# Patient Record
Sex: Female | Born: 1970 | Hispanic: Yes | Marital: Married | Smoking: Former smoker
Health system: Southern US, Community
[De-identification: ages and names within clinical notes are randomized; demographics above are authoritative.]

## PROBLEM LIST (undated history)

## (undated) DIAGNOSIS — I10 Essential (primary) hypertension: Secondary | ICD-10-CM

## (undated) DIAGNOSIS — E28319 Asymptomatic premature menopause: Secondary | ICD-10-CM

## (undated) DIAGNOSIS — N200 Calculus of kidney: Secondary | ICD-10-CM

## (undated) DIAGNOSIS — M359 Systemic involvement of connective tissue, unspecified: Secondary | ICD-10-CM

## (undated) DIAGNOSIS — K219 Gastro-esophageal reflux disease without esophagitis: Secondary | ICD-10-CM

## (undated) DIAGNOSIS — G43909 Migraine, unspecified, not intractable, without status migrainosus: Secondary | ICD-10-CM

## (undated) DIAGNOSIS — F419 Anxiety disorder, unspecified: Secondary | ICD-10-CM

## (undated) DIAGNOSIS — M1990 Unspecified osteoarthritis, unspecified site: Secondary | ICD-10-CM

## (undated) DIAGNOSIS — Z87442 Personal history of urinary calculi: Secondary | ICD-10-CM

## (undated) DIAGNOSIS — J189 Pneumonia, unspecified organism: Secondary | ICD-10-CM

## (undated) DIAGNOSIS — T7840XA Allergy, unspecified, initial encounter: Secondary | ICD-10-CM

## (undated) HISTORY — PX: AUGMENTATION MAMMAPLASTY: SUR837

## (undated) HISTORY — PX: HERNIA REPAIR: SHX51

## (undated) HISTORY — PX: TUBAL LIGATION: SHX77

## (undated) HISTORY — PX: COSMETIC SURGERY: SHX468

## (undated) HISTORY — DX: Migraine, unspecified, not intractable, without status migrainosus: G43.909

## (undated) HISTORY — DX: Asymptomatic premature menopause: E28.319

## (undated) HISTORY — DX: Essential (primary) hypertension: I10

## (undated) HISTORY — DX: Calculus of kidney: N20.0

## (undated) HISTORY — DX: Unspecified osteoarthritis, unspecified site: M19.90

## (undated) HISTORY — DX: Allergy, unspecified, initial encounter: T78.40XA

## (undated) HISTORY — PX: OTHER SURGICAL HISTORY: SHX169

## (undated) HISTORY — PX: ANKLE SURGERY: SHX546

---

## 2017-09-19 DIAGNOSIS — Z6836 Body mass index (BMI) 36.0-36.9, adult: Secondary | ICD-10-CM | POA: Insufficient documentation

## 2017-09-19 DIAGNOSIS — Z6834 Body mass index (BMI) 34.0-34.9, adult: Secondary | ICD-10-CM | POA: Insufficient documentation

## 2017-09-19 DIAGNOSIS — E669 Obesity, unspecified: Secondary | ICD-10-CM | POA: Insufficient documentation

## 2017-10-17 DIAGNOSIS — M059 Rheumatoid arthritis with rheumatoid factor, unspecified: Secondary | ICD-10-CM | POA: Insufficient documentation

## 2020-05-19 NOTE — Progress Notes (Signed)
Patient, No Pcp Per   Chief Complaint  Patient presents with  . Vaginal Itching    irritation, no discharge/odor x 1 week  . Urinary Tract Infection    frequency urinating, no burning x 1 week    HPI:      [name] is a 49 y.o. No obstetric history on file. whose LMP was No LMP recorded. Patient is postmenopausal., presents today for NP eval of vaginal itching without increased d/c or odor for the past wk. Treated with monistat-3 with some sx improvement. Also did home remedies with tea tree oil. Recently moved here and changed detergents, also using dryer sheets, uses regular soap but recently changed to vagisil after sx started.  No recent abx use.   Also with urinary frequency/urgency with pelvic pressure for the past wk. No LBP, fevers, hematuria, dysuria. Hx of UTIs in distant past. Drinks minimal caffeine.  Last pap about 2 yrs ago with home GYN. No hx of abn paps.  Last mammo WNL 9/21.    History reviewed. No pertinent past medical history.  Past Surgical History:  Procedure Laterality Date  . ANKLE SURGERY    . AUGMENTATION MAMMAPLASTY     Breast Implant  . [place]  . OTHER SURGICAL HISTORY     tummy tuck  . OTHER SURGICAL HISTORY     carpal tunnel    History reviewed. No pertinent family history.  Social History   Socioeconomic History  . Marital status: Married    Spouse name: Not on file  . Number of children: Not on file  . Years of education: Not on file  . Highest education level: Not on file  Occupational History  . Not on file  Tobacco Use  . Smoking status: Former Research scientist (life sciences)  . Smokeless tobacco: Never Used  Vaping Use  . Vaping Use: Never used  Substance and Sexual Activity  . Alcohol use: Yes  . Drug use: Never  . Sexual activity: Not Currently    Birth control/protection: Post-menopausal  Other Topics Concern  . Not on file  Social History Narrative  . Not on file   Social Determinants of  Health   Financial Resource Strain:   . Difficulty of Paying Living Expenses: Not on file  Food Insecurity:   . Worried About Charity fundraiser in the Last Year: Not on file  . Ran Out of Food in the Last Year: Not on file  Transportation Needs:   . Lack of Transportation (Medical): Not on file  . Lack of Transportation (Non-Medical): Not on file  Physical Activity:   . Days of Exercise per Week: Not on file  . Minutes of Exercise per Session: Not on file  Stress:   . Feeling of Stress : Not on file  Social Connections:   . Frequency of Communication with Friends and Family: Not on file  . Frequency of Social Gatherings with Friends and Family: Not on file  . Attends Religious Services: Not on file  . Active Member of Clubs or Organizations: Not on file  . Attends Archivist Meetings: Not on file  . Marital Status: Not on file  Intimate Partner Violence:   . Fear of Current or Ex-Partner: Not on file  . Emotionally Abused: Not on file  . Physically Abused: Not on file  . Sexually Abused: Not on file    No outpatient medications prior to visit.   No  facility-administered medications prior to visit.      ROS:  Review of Systems  Constitutional: Negative for fever.  Gastrointestinal: Negative for blood in stool, constipation, diarrhea, nausea and vomiting.  Genitourinary: Positive for dyspareunia, frequency and urgency. Negative for dysuria, flank pain, hematuria, vaginal bleeding, vaginal discharge and vaginal pain.  Musculoskeletal: Negative for back pain.  Skin: Negative for rash.    OBJECTIVE:   Vitals:  BP 120/80   Ht 5\' 3"  (1.6 m)   Wt 206 lb (93.4 kg)   BMI 36.49 kg/m   Physical Exam Vitals reviewed.  Constitutional:      Appearance: She is well-developed.  Pulmonary:     Effort: Pulmonary effort is normal.  Genitourinary:    General: Normal vulva.     Pubic Area: No rash.      Labia:        Right: No rash, tenderness or lesion.         Left: No rash, tenderness or lesion.      Vagina: Vaginal discharge present. No erythema or tenderness.     Cervix: Normal.     Uterus: Normal. Not enlarged and not tender.      Adnexa: Right adnexa normal and left adnexa normal.       Right: No mass or tenderness.         Left: No mass or tenderness.    Musculoskeletal:        General: Normal range of motion.     Cervical back: Normal range of motion.  Skin:    General: Skin is warm and dry.  Neurological:     General: No focal deficit present.     Mental Status: She is alert and oriented to person, place, and time.  Psychiatric:        Mood and Affect: Mood normal.        Behavior: Behavior normal.        Thought Content: Thought content normal.        Judgment: Judgment normal.     Results: Results for orders placed or performed in visit on 05/20/20 (from the past 24 hour(s))  POCT Wet Prep with KOH     Status: Normal   Collection Time: 05/20/20 10:02 AM  Result Value Ref Range   Trichomonas, UA Negative    Clue Cells Wet Prep HPF POC neg    Epithelial Wet Prep HPF POC     Yeast Wet Prep HPF POC neg    Bacteria Wet Prep HPF POC     RBC Wet Prep HPF POC     WBC Wet Prep HPF POC     KOH Prep POC Negative Negative  POCT Urinalysis Dipstick     Status: Normal   Collection Time: 05/20/20 10:03 AM  Result Value Ref Range   Color, UA yellow    Clarity, UA clear    Glucose, UA Negative Negative   Bilirubin, UA neg    Ketones, UA neg    Spec Grav, UA 1.020 1.010 - 1.025   Blood, UA neg    pH, UA 5.0 5.0 - 8.0   Protein, UA Negative Negative   Urobilinogen, UA     Nitrite, UA neg    Leukocytes, UA Negative Negative   Appearance     Odor       Assessment/Plan: Vaginal itching - Plan: fluconazole (DIFLUCAN) 150 MG tablet, POCT Wet Prep with KOH; neg exam, neg wet prep. Sx are more labia majora/ext and not internal.  Treat empirically with diflucan, but sx may be more chem derm. Dove sens skin soap, line dry underwear.  F/u prn.   UTI symptoms - Plan: POCT Urinalysis Dipstick, Urine Culture; neg UA, pos sx. Check C&S. Will f/u if pos.    Meds ordered this encounter  Medications  . fluconazole (DIFLUCAN) 150 MG tablet    Sig: Take 1 tablet (150 mg total) by mouth once for 1 dose.    Dispense:  1 tablet    Refill:  0    Order Specific Question:   Supervising Provider    Answer:   Gae Dry [[id number]]      Return if symptoms worsen or fail to improve.  Janaki Exley B. Phylis Javed, PA-C 05/20/2020 10:05 AM

## 2020-05-19 NOTE — Patient Instructions (Signed)
I value your feedback and entrusting us with your care. If you get a [facility] patient survey, I would appreciate you taking the time to let us know about your experience today. Thank you!  As of July 11, 2019, your lab results will be released to your MyChart immediately, before I even have a chance to see them. Please give me time to review them and contact you if there are any abnormalities. Thank you for your patience.  

## 2020-05-20 ENCOUNTER — Encounter: Payer: Self-pay | Admitting: Obstetrics and Gynecology

## 2020-05-20 ENCOUNTER — Ambulatory Visit (INDEPENDENT_AMBULATORY_CARE_PROVIDER_SITE_OTHER): Payer: Commercial Managed Care - PPO | Admitting: Obstetrics and Gynecology

## 2020-05-20 ENCOUNTER — Other Ambulatory Visit: Payer: Self-pay

## 2020-05-20 VITALS — BP 120/80 | Ht 63.0 in | Wt 206.0 lb

## 2020-05-20 DIAGNOSIS — N898 Other specified noninflammatory disorders of vagina: Secondary | ICD-10-CM | POA: Diagnosis not present

## 2020-05-20 DIAGNOSIS — R399 Unspecified symptoms and signs involving the genitourinary system: Secondary | ICD-10-CM | POA: Diagnosis not present

## 2020-05-20 LAB — POCT URINALYSIS DIPSTICK
Bilirubin, UA: NEGATIVE
Blood, UA: NEGATIVE
Glucose, UA: NEGATIVE
Ketones, UA: NEGATIVE
Leukocytes, UA: NEGATIVE
Nitrite, UA: NEGATIVE
Protein, UA: NEGATIVE
Spec Grav, UA: 1.02 (ref 1.010–1.025)
pH, UA: 5 (ref 5.0–8.0)

## 2020-05-20 LAB — POCT WET PREP WITH KOH
Clue Cells Wet Prep HPF POC: NEGATIVE
KOH Prep POC: NEGATIVE
Trichomonas, UA: NEGATIVE
Yeast Wet Prep HPF POC: NEGATIVE

## 2020-05-20 MED ORDER — FLUCONAZOLE 150 MG PO TABS: 150.0000 mg | ORAL_TABLET | Freq: Once | ORAL | 0 refills | Status: AC

## 2020-05-22 LAB — URINE CULTURE

## 2020-07-29 ENCOUNTER — Other Ambulatory Visit: Payer: Self-pay

## 2020-07-29 ENCOUNTER — Ambulatory Visit: Payer: Commercial Managed Care - PPO | Admitting: Adult Health

## 2020-07-29 ENCOUNTER — Encounter: Payer: Self-pay | Admitting: Adult Health

## 2020-07-29 VITALS — BP 143/89 | HR 93 | Temp 98.0°F | Resp 16 | Ht 62.0 in | Wt 199.8 lb

## 2020-07-29 DIAGNOSIS — F419 Anxiety disorder, unspecified: Secondary | ICD-10-CM

## 2020-07-29 DIAGNOSIS — R1310 Dysphagia, unspecified: Secondary | ICD-10-CM | POA: Diagnosis not present

## 2020-07-29 DIAGNOSIS — H539 Unspecified visual disturbance: Secondary | ICD-10-CM

## 2020-07-29 DIAGNOSIS — K219 Gastro-esophageal reflux disease without esophagitis: Secondary | ICD-10-CM | POA: Diagnosis not present

## 2020-07-29 DIAGNOSIS — Z8669 Personal history of other diseases of the nervous system and sense organs: Secondary | ICD-10-CM

## 2020-07-29 DIAGNOSIS — E538 Deficiency of other specified B group vitamins: Secondary | ICD-10-CM

## 2020-07-29 DIAGNOSIS — R319 Hematuria, unspecified: Secondary | ICD-10-CM

## 2020-07-29 DIAGNOSIS — Z1152 Encounter for screening for COVID-19: Secondary | ICD-10-CM

## 2020-07-29 DIAGNOSIS — M62838 Other muscle spasm: Secondary | ICD-10-CM

## 2020-07-29 DIAGNOSIS — Z1389 Encounter for screening for other disorder: Secondary | ICD-10-CM

## 2020-07-29 DIAGNOSIS — E559 Vitamin D deficiency, unspecified: Secondary | ICD-10-CM

## 2020-07-29 DIAGNOSIS — R911 Solitary pulmonary nodule: Secondary | ICD-10-CM

## 2020-07-29 DIAGNOSIS — Z1211 Encounter for screening for malignant neoplasm of colon: Secondary | ICD-10-CM | POA: Insufficient documentation

## 2020-07-29 DIAGNOSIS — L659 Nonscarring hair loss, unspecified: Secondary | ICD-10-CM

## 2020-07-29 DIAGNOSIS — R5383 Other fatigue: Secondary | ICD-10-CM

## 2020-07-29 LAB — POCT URINALYSIS DIPSTICK
Bilirubin, UA: NEGATIVE
Glucose, UA: NEGATIVE
Ketones, UA: NEGATIVE
Leukocytes, UA: NEGATIVE
Nitrite, UA: NEGATIVE
Protein, UA: NEGATIVE
Spec Grav, UA: <=1.005 — AB (ref 1.010–1.025)
Urobilinogen, UA: 0.2 E.U./dL
pH, UA: 8.5 — AB (ref 5.0–8.0)

## 2020-07-29 MED ORDER — BACLOFEN 10 MG PO TABS: 10.0000 mg | ORAL_TABLET | Freq: Every evening | ORAL | 0 refills | Status: DC | PRN

## 2020-07-29 MED ORDER — HYDROCHLOROTHIAZIDE 25 MG PO TABS: 25.0000 mg | ORAL_TABLET | Freq: Every day | ORAL | 0 refills | Status: DC

## 2020-07-29 NOTE — Addendum Note (Signed)
Addended by: Fonda Kinder on: 07/29/2020 04:59 PM   Modules accepted: Orders

## 2020-07-29 NOTE — Patient Instructions (Addendum)
Hydrochlorothiazide, HCTZ Oral Capsules or Tablets What is this medicine? HYDROCHLOROTHIAZIDE (hye droe klor oh THYE a zide) is a diuretic. It helps you make more urine and to lose salt and excess water from your body. It treats swelling from heart, kidney, or liver disease. It also treats high blood pressure. This medicine may be used for other purposes; ask your health care provider or pharmacist if you have questions. COMMON BRAND NAME(S): Esidrix, Ezide, HydroDIURIL, Microzide, Oretic, Zide What should I tell my health care provider before I take this medicine? They need to know if you have any of these conditions:  diabetes  gout  immune system problems, like lupus  kidney disease or kidney stones  liver disease  pancreatitis  small amount of urine or difficulty passing urine  an unusual or allergic reaction to hydrochlorothiazide, sulfa drugs, other medicines, foods, dyes, or preservatives  pregnant or trying to get pregnant  breast-feeding How should I use this medicine? Take this drug by mouth. Take it as directed on the prescription label at the same time every day. You can take it with or without food. If it upsets your stomach, take it with food. Keep taking it unless your health care provider tells you to stop. Talk to your health care provider about the use of this drug in children. While it may be prescribed for children as young as newborns for selected conditions, precautions do apply. Overdosage: If you think you have taken too much of this medicine contact a poison control center or emergency room at once. NOTE: This medicine is only for you. Do not share this medicine with others. What if I miss a dose? If you miss a dose, take it as soon as you can. If it is almost time for your next dose, take only that dose. Do not take double or extra doses. What may interact with this  medicine?  cholestyramine  colestipol  digoxin  dofetilide  lithium  medicines for blood pressure  medicines for diabetes  medicines that relax muscles for surgery  other diuretics  steroid medicines like prednisone or cortisone This list may not describe all possible interactions. Give your health care provider a list of all the medicines, herbs, non-prescription drugs, or dietary supplements you use. Also tell them if you smoke, drink alcohol, or use illegal drugs. Some items may interact with your medicine. What should I watch for while using this medicine? Visit your doctor or health care professional for regular checks on your progress. Check your blood pressure as directed. Ask your doctor or health care professional what your blood pressure should be and when you should contact him or her. Talk to your health care professional about your risk of skin cancer. You may be more at risk for skin cancer if you take this medicine. This medicine can make you more sensitive to the sun. Keep out of the sun. If you cannot avoid being in the sun, wear protective clothing and use sunscreen. Do not use sun lamps or tanning beds/booths. You may need to be on a special diet while taking this medicine. Ask your doctor. Check with your doctor or health care professional if you get an attack of severe diarrhea, nausea and vomiting, or if you sweat a lot. The loss of too much body fluid can make it dangerous for you to take this medicine. You may get drowsy or dizzy. Do not drive, use machinery, or do anything that needs mental alertness until you know how  this medicine affects you. Do not stand or sit up quickly, especially if you are an older patient. This reduces the risk of dizzy or fainting spells. Alcohol may interfere with the effect of this medicine. Avoid alcoholic drinks. This medicine may increase blood sugar. Ask your healthcare provider if changes in diet or medicines are needed if you  have diabetes. What side effects may I notice from receiving this medicine? Side effects that you should report to your doctor or health care professional as soon as possible:  allergic reactions such as skin rash or itching, hives, swelling of the lips, mouth, tongue, or throat  changes in vision  chest pain  eye pain  fast or irregular heartbeat  feeling faint or lightheaded, falls  gout attack  muscle pain or cramps  pain or difficulty when passing urine  pain, tingling, numbness in the hands or feet  redness, blistering, peeling or loosening of the skin, including inside the mouth   signs and symptoms of high blood sugar such as being more thirsty or hungry or having to urinate more than normal. You may also feel very tired or have blurry vision.  unusually weak Side effects that usually do not require medical attention (report to your doctor or health care professional if they continue or are bothersome):  change in sex drive or performance  dry mouth  headache  stomach upset This list may not describe all possible side effects. Call your doctor for medical advice about side effects. You may report side effects to FDA at 1-800-FDA-1088. Where should I keep my medicine? Keep out of the reach of children and pets. Store at room temperature between 20 and 25 degrees C (68 and 77 degrees F). Protect from light and moisture. Keep the container tightly closed. Do not freeze. Throw away any unused drug after the expiration date. NOTE: This sheet is a summary. It may not cover all possible information. If you have questions about this medicine, talk to your doctor, pharmacist, or health care provider.  2020 Elsevier/Gold Standard (2019-03-21 16:52:59) Psyllium granules or powder for solution What is this medicine? PSYLLIUM (SIL i yum) is a bulk-forming fiber laxative. This medicine is used to treat constipation. Increasing fiber in the diet may also help lower cholesterol  and promote heart health for some people. This medicine may be used for other purposes; ask your health care provider or pharmacist if you have questions. COMMON BRAND NAME(S): Fiber Therapy, GenFiber, Geri-Mucil, Hydrocil, Konsyl, Metamucil, Metamucil MultiHealth, Mucilin, Natural Fiber Therapy, Reguloid What should I tell my health care provider before I take this medicine? They need to know if you have any of these conditions:  blockage in your bowel  difficulty swallowing  inflammatory bowel disease  phenylketonuria  stomach or intestine problems  sudden change in bowel habits lasting more than 2 weeks  an unusual or allergic reaction to psyllium, other medicines, dyes, or preservatives  pregnant or trying or get pregnant  breast-feeding How should I use this medicine? Mix this medicine into a full glass (240 mL) of water or other cool drink. Take this medicine by mouth. Follow the directions on the package labeling, or take as directed by your health care professional. Take your medicine at regular intervals. Do not take your medicine more often than directed. Talk to your pediatrician regarding the use of this medicine in children. While this drug may be prescribed for children as young as 62 years old for selected conditions, precautions do apply. Overdosage: If  you think you have taken too much of this medicine contact a poison control center or emergency room at once. NOTE: This medicine is only for you. Do not share this medicine with others. What if I miss a dose? If you miss a dose, take it as soon as you can. If it is almost time for your next dose, take only that dose. Do not take double or extra doses. What may interact with this medicine? Interactions are not expected. Take this product at least 2 hours before or after other medicines. This list may not describe all possible interactions. Give your health care provider a list of all the medicines, herbs,  non-prescription drugs, or dietary supplements you use. Also tell them if you smoke, drink alcohol, or use illegal drugs. Some items may interact with your medicine. What should I watch for while using this medicine? Check with your doctor or health care professional if your symptoms do not start to get better or if they get worse. Stop using this medicine and contact your doctor or health care professional if you have rectal bleeding or if you have to treat your constipation for more than 1 week. These could be signs of a more serious condition. Drink several glasses of water a day while you are taking this medicine. This will help to relieve constipation and prevent dehydration. What side effects may I notice from receiving this medicine? Side effects that you should report to your doctor or health care professional as soon as possible:  allergic reactions like skin rash, itching or hives, swelling of the face, lips, or tongue  breathing problems  chest pain  nausea, vomiting  rectal bleeding  trouble swallowing Side effects that usually do not require medical attention (report to your doctor or health care professional if they continue or are bothersome):  bloating  gas  stomach cramps This list may not describe all possible side effects. Call your doctor for medical advice about side effects. You may report side effects to FDA at 1-800-FDA-1088. Where should I keep my medicine? Keep out of the reach of children. Store at room temperature between 15 and 30 degrees C (59 and 86 degrees F). Protect from moisture. Throw away any unused medicine after the expiration date. NOTE: This sheet is a summary. It may not cover all possible information. If you have questions about this medicine, talk to your doctor, pharmacist, or health care provider.  2020 Elsevier/Gold Standard (2017-12-12 15:41:08) Muscle Cramps and Spasms Muscle cramps and spasms are when muscles tighten by themselves.  They usually get better within minutes. Muscle cramps are painful. They are usually stronger and last longer than muscle spasms. Muscle spasms may or may not be painful. They can last a few seconds or much longer. Cramps and spasms can affect any muscle, but they occur most often in the calf muscles of the leg. They are usually not caused by a serious problem. In many cases, the cause is not known. Some common causes include:  Doing more physical work or exercise than your body is ready for.  Using the muscles too much (overuse) by repeating certain movements too many times.  Staying in a certain position for a long time.  Playing a sport or doing an activity without preparing properly.  Using bad form or technique while playing a sport or doing an activity.  Not having enough water in your body (dehydration).  Injury.  Side effects of some medicines.  Low levels of the salts  and minerals in your blood (electrolytes), such as low potassium or calcium. Follow these instructions at home: Managing pain and stiffness      Massage, stretch, and relax the muscle. Do this for many minutes at a time.  If told, put heat on tight or tense muscles as often as told by your doctor. Use the heat source that your doctor recommends, such as a moist heat pack or a heating pad. ? Place a towel between your skin and the heat source. ? Leave the heat on for 20-30 minutes. ? Remove the heat if your skin turns bright red. This is very important if you are not able to feel pain, heat, or cold. You may have a greater risk of getting burned.  If told, put ice on the affected area. This may help if you are sore or have pain after a cramp or spasm. ? Put ice in a plastic bag. ? Place a towel between your skin and the bag. ? Leave the ice on for 20 minutes, 2-3 times a day.  Try taking hot showers or baths to help relax tight muscles. Eating and drinking  Drink enough fluid to keep your pee (urine) pale  yellow.  Eat a healthy diet to help ensure that your muscles work well. This should include: ? Fruits and vegetables. ? Lean protein. ? Whole grains. ? Low-fat or nonfat dairy products. General instructions  If you are having cramps often, avoid intense exercise for several days.  Take over-the-counter and prescription medicines only as told by your doctor.  Watch for any changes in your symptoms.  Keep all follow-up visits as told by your doctor. This is important. Contact a doctor if:  Your cramps or spasms get worse or happen more often.  Your cramps or spasms do not get better with time. Summary  Muscle cramps and spasms are when muscles tighten by themselves. They usually get better within minutes.  Cramps and spasms occur most often in the calf muscles of the leg.  Massage, stretch, and relax the muscle. This may help the cramp or spasm go away.  Drink enough fluid to keep your pee (urine) pale yellow. This information is not intended to replace advice given to you by your health care provider. Make sure you discuss any questions you have with your health care provider. Document Revised: 12/11/2017 Document Reviewed: 12/11/2017 Elsevier Patient Education  2020 [employer] Baclofen tablets What is this medicine? BACLOFEN (BAK loe fen) helps relieve spasms and cramping of muscles. It may be used to treat symptoms of multiple sclerosis or spinal cord injury. This medicine may be used for other purposes; ask your health care provider or pharmacist if you have questions. COMMON BRAND NAME(S): ED Baclofen, Lioresal What should I tell my health care provider before I take this medicine? They need to know if you have any of these conditions:  kidney disease  seizures  stroke  an unusual or allergic reaction to baclofen, other medicines, foods, dyes, or preservatives  pregnant or trying to get pregnant  breast-feeding How should I use this medicine? Take this  medicine by mouth. Swallow it with a drink of water. Follow the directions on the prescription label. Do not take more medicine than you are told to take. Talk to your pediatrician regarding the use of this medicine in children. Special care may be needed. Overdosage: If you think you have taken too much of this medicine contact a poison control center or emergency room at  once. NOTE: This medicine is only for you. Do not share this medicine with others. What if I miss a dose? If you miss a dose, take it as soon as you can. If it is almost time for your next dose, take only that dose. Do not take double or extra doses. What may interact with this medicine? Do not take this medication with any of the following medicines:  narcotic medicines for cough This medicine may also interact with the following medications:  alcohol  antihistamines for allergy, cough and cold  certain medicines for anxiety or sleep  certain medicines for depression like amitriptyline, fluoxetine, sertraline  certain medicines for seizures like phenobarbital, primidone  general anesthetics like halothane, isoflurane, methoxyflurane, propofol  local anesthetics like lidocaine, pramoxine, tetracaine  medicines that relax muscles for surgery  narcotic medicines for pain  phenothiazines like chlorpromazine, mesoridazine, prochlorperazine, thioridazine This list may not describe all possible interactions. Give your health care provider a list of all the medicines, herbs, non-prescription drugs, or dietary supplements you use. Also tell them if you smoke, drink alcohol, or use illegal drugs. Some items may interact with your medicine. What should I watch for while using this medicine? Tell your doctor or health care professional if your symptoms do not start to get better or if they get worse. Do not suddenly stop taking your medicine. If you do, you may develop a severe reaction. If your doctor wants you to stop the  medicine, the dose will be slowly lowered over time to avoid any side effects. Follow the advice of your doctor. You may get drowsy or dizzy. Do not drive, use machinery, or do anything that needs mental alertness until you know how this medicine affects you. Do not stand or sit up quickly, especially if you are an older patient. This reduces the risk of dizzy or fainting spells. Alcohol may interfere with the effect of this medicine. Avoid alcoholic drinks. If you are taking another medicine that also causes drowsiness, you may have more side effects. Give your health care provider a list of all medicines you use. Your doctor will tell you how much medicine to take. Do not take more medicine than directed. Call emergency for help if you have problems breathing or unusual sleepiness. What side effects may I notice from receiving this medicine? Side effects that you should report to your doctor or health care professional as soon as possible:  allergic reactions like skin rash, itching or hives, swelling of the face, lips, or tongue  breathing problems  changes in emotions or moods  changes in vision  chest pain  fast, irregular heartbeat  feeling faint or lightheaded, falls  hallucinations  loss of balance or coordination  ringing of the ears  seizures  trouble passing urine or change in the amount of urine  trouble walking  unusually weak or tired Side effects that usually do not require medical attention (report to your doctor or health care professional if they continue or are bothersome):  changes in taste  confusion  constipation  diarrhea  dry mouth  headache  muscle weakness  nausea, vomiting  trouble sleeping This list may not describe all possible side effects. Call your doctor for medical advice about side effects. You may report side effects to FDA at 1-800-FDA-1088. Where should I keep my medicine? Keep out of the reach of children. Store at room  temperature between 15 and 30 degrees C (59 and 86 degrees F). Keep container tightly closed.  Throw away any unused medicine after the expiration date. NOTE: This sheet is a summary. It may not cover all possible information. If you have questions about this medicine, talk to your doctor, pharmacist, or health care provider.  2020 Elsevier/Gold Standard (2017-04-29 09:56:42) Health Maintenance, Female Adopting a healthy lifestyle and getting preventive care are important in promoting health and wellness. Ask your health care provider about:  The right schedule for you to have regular tests and exams.  Things you can do on your own to prevent diseases and keep yourself healthy. What should I know about diet, weight, and exercise? Eat a healthy diet   Eat a diet that includes plenty of vegetables, fruits, low-fat dairy products, and lean protein.  Do not eat a lot of foods that are high in solid fats, added sugars, or sodium. Maintain a healthy weight Body mass index (BMI) is used to identify weight problems. It estimates body fat based on height and weight. Your health care provider can help determine your BMI and help you achieve or maintain a healthy weight. Get regular exercise Get regular exercise. This is one of the most important things you can do for your health. Most adults should:  Exercise for at least 150 minutes each week. The exercise should increase your heart rate and make you sweat (moderate-intensity exercise).  Do strengthening exercises at least twice a week. This is in addition to the moderate-intensity exercise.  Spend less time sitting. Even light physical activity can be beneficial. Watch cholesterol and blood lipids Have your blood tested for lipids and cholesterol at 50 years of age, then have this test every 5 years. Have your cholesterol levels checked more often if:  Your lipid or cholesterol levels are high.  You are older than 50 years of age.  You are  at high risk for heart disease. What should I know about cancer screening? Depending on your health history and family history, you may need to have cancer screening at various ages. This may include screening for:  Breast cancer.  Cervical cancer.  Colorectal cancer.  Skin cancer.  Lung cancer. What should I know about heart disease, diabetes, and high blood pressure? Blood pressure and heart disease  High blood pressure causes heart disease and increases the risk of stroke. This is more likely to develop in people who have high blood pressure readings, are of African descent, or are overweight.  Have your blood pressure checked: ? Every 3-5 years if you are 20-43 years of age. ? Every year if you are 48 years old or older. Diabetes Have regular diabetes screenings. This checks your fasting blood sugar level. Have the screening done:  Once every three years after age 64 if you are at a normal weight and have a low risk for diabetes.  More often and at a younger age if you are overweight or have a high risk for diabetes. What should I know about preventing infection? Hepatitis B If you have a higher risk for hepatitis B, you should be screened for this virus. Talk with your health care provider to find out if you are at risk for hepatitis B infection. Hepatitis C Testing is recommended for:  Everyone born from [birth year] through 1966.  Anyone with known risk factors for hepatitis C. Sexually transmitted infections (STIs)  Get screened for STIs, including gonorrhea and chlamydia, if: ? You are sexually active and are younger than 50 years of age. ? You are older than 50 years of age  and your health care provider tells you that you are at risk for this type of infection. ? Your sexual activity has changed since you were last screened, and you are at increased risk for chlamydia or gonorrhea. Ask your health care provider if you are at risk.  Ask your health care provider about  whether you are at high risk for HIV. Your health care provider may recommend a prescription medicine to help prevent HIV infection. If you choose to take medicine to prevent HIV, you should first get tested for HIV. You should then be tested every 3 months for as long as you are taking the medicine. Pregnancy  If you are about to stop having your period (premenopausal) and you may become pregnant, seek counseling before you get pregnant.  Take 400 to 800 micrograms (mcg) of folic acid every day if you become pregnant.  Ask for birth control (contraception) if you want to prevent pregnancy. Osteoporosis and menopause Osteoporosis is a disease in which the bones lose minerals and strength with aging. This can result in bone fractures. If you are 62 years old or older, or if you are at risk for osteoporosis and fractures, ask your health care provider if you should:  Be screened for bone loss.  Take a calcium or vitamin D supplement to lower your risk of fractures.  Be given hormone replacement therapy (HRT) to treat symptoms of menopause. Follow these instructions at home: Lifestyle  Do not use any products that contain nicotine or tobacco, such as cigarettes, e-cigarettes, and chewing tobacco. If you need help quitting, ask your health care provider.  Do not use street drugs.  Do not share needles.  Ask your health care provider for help if you need support or information about quitting drugs. Alcohol use  Do not drink alcohol if: ? Your health care provider tells you not to drink. ? You are pregnant, may be pregnant, or are planning to become pregnant.  If you drink alcohol: ? Limit how much you use to 0-1 drink a day. ? Limit intake if you are breastfeeding.  Be aware of how much alcohol is in your drink. In the [place] one drink equals one 12 oz bottle of beer (355 mL), one 5 oz glass of wine (148 mL), or one 1 oz glass of hard liquor (44 mL). General instructions  Schedule  regular health, dental, and eye exams.  Stay current with your vaccines.  Tell your health care provider if: ? You often feel depressed. ? You have ever been abused or do not feel safe at home. Summary  Adopting a healthy lifestyle and getting preventive care are important in promoting health and wellness.  Follow your health care provider's instructions about healthy diet, exercising, and getting tested or screened for diseases.  Follow your health care provider's instructions on monitoring your cholesterol and blood pressure. This information is not intended to replace advice given to you by your health care provider. Make sure you discuss any questions you have with your health care provider. Document Revised: 07/11/2018 Document Reviewed: 07/11/2018 Elsevier Patient Education  2020 [employer] Inc. Hydrochlorothiazide, HCTZ Oral Capsules or Tablets What is this medicine? HYDROCHLOROTHIAZIDE (hye droe klor oh THYE a zide) is a diuretic. It helps you make more urine and to lose salt and excess water from your body. It treats swelling from heart, kidney, or liver disease. It also treats high blood pressure. This medicine may be used for other purposes; ask your health care provider  or pharmacist if you have questions. COMMON BRAND NAME(S): Esidrix, Ezide, HydroDIURIL, Microzide, Oretic, Zide What should I tell my health care provider before I take this medicine? They need to know if you have any of these conditions:  diabetes  gout  immune system problems, like lupus  kidney disease or kidney stones  liver disease  pancreatitis  small amount of urine or difficulty passing urine  an unusual or allergic reaction to hydrochlorothiazide, sulfa drugs, other medicines, foods, dyes, or preservatives  pregnant or trying to get pregnant  breast-feeding How should I use this medicine? Take this drug by mouth. Take it as directed on the prescription label at the same time every day.  You can take it with or without food. If it upsets your stomach, take it with food. Keep taking it unless your health care provider tells you to stop. Talk to your health care provider about the use of this drug in children. While it may be prescribed for children as young as newborns for selected conditions, precautions do apply. Overdosage: If you think you have taken too much of this medicine contact a poison control center or emergency room at once. NOTE: This medicine is only for you. Do not share this medicine with others. What if I miss a dose? If you miss a dose, take it as soon as you can. If it is almost time for your next dose, take only that dose. Do not take double or extra doses. What may interact with this medicine?  cholestyramine  colestipol  digoxin  dofetilide  lithium  medicines for blood pressure  medicines for diabetes  medicines that relax muscles for surgery  other diuretics  steroid medicines like prednisone or cortisone This list may not describe all possible interactions. Give your health care provider a list of all the medicines, herbs, non-prescription drugs, or dietary supplements you use. Also tell them if you smoke, drink alcohol, or use illegal drugs. Some items may interact with your medicine. What should I watch for while using this medicine? Visit your doctor or health care professional for regular checks on your progress. Check your blood pressure as directed. Ask your doctor or health care professional what your blood pressure should be and when you should contact him or her. Talk to your health care professional about your risk of skin cancer. You may be more at risk for skin cancer if you take this medicine. This medicine can make you more sensitive to the sun. Keep out of the sun. If you cannot avoid being in the sun, wear protective clothing and use sunscreen. Do not use sun lamps or tanning beds/booths. You may need to be on a special diet  while taking this medicine. Ask your doctor. Check with your doctor or health care professional if you get an attack of severe diarrhea, nausea and vomiting, or if you sweat a lot. The loss of too much body fluid can make it dangerous for you to take this medicine. You may get drowsy or dizzy. Do not drive, use machinery, or do anything that needs mental alertness until you know how this medicine affects you. Do not stand or sit up quickly, especially if you are an older patient. This reduces the risk of dizzy or fainting spells. Alcohol may interfere with the effect of this medicine. Avoid alcoholic drinks. This medicine may increase blood sugar. Ask your healthcare provider if changes in diet or medicines are needed if you have diabetes. What side effects may  I notice from receiving this medicine? Side effects that you should report to your doctor or health care professional as soon as possible:  allergic reactions such as skin rash or itching, hives, swelling of the lips, mouth, tongue, or throat  changes in vision  chest pain  eye pain  fast or irregular heartbeat  feeling faint or lightheaded, falls  gout attack  muscle pain or cramps  pain or difficulty when passing urine  pain, tingling, numbness in the hands or feet  redness, blistering, peeling or loosening of the skin, including inside the mouth   signs and symptoms of high blood sugar such as being more thirsty or hungry or having to urinate more than normal. You may also feel very tired or have blurry vision.  unusually weak Side effects that usually do not require medical attention (report to your doctor or health care professional if they continue or are bothersome):  change in sex drive or performance  dry mouth  headache  stomach upset This list may not describe all possible side effects. Call your doctor for medical advice about side effects. You may report side effects to FDA at 1-800-FDA-1088. Where  should I keep my medicine? Keep out of the reach of children and pets. Store at room temperature between 20 and 25 degrees C (68 and 77 degrees F). Protect from light and moisture. Keep the container tightly closed. Do not freeze. Throw away any unused drug after the expiration date. NOTE: This sheet is a summary. It may not cover all possible information. If you have questions about this medicine, talk to your doctor, pharmacist, or health care provider.  2020 Elsevier/Gold Standard (2019-03-21 16:52:59)

## 2020-07-29 NOTE — Progress Notes (Signed)
New patient visit   Patient: Joan Johnson   DOB: [birth year]   49 y.o. Female  MRN: [id number] Visit Date: 07/29/2020  Today's healthcare provider: Jairo Ben, FNP   Chief Complaint  Patient presents with  . New Patient (Initial Visit)    Patient presents in office today to establish care, she states that she has moved to [facility] from [place] 3 months ago. Patient states that she has been having a hard time adjusting since moving. Patient reports concern of elevated blood pressure and recurring migraines.Patient reports that she was in ED 2 weeks ago with complaint of coughing up blood, she states that chest xray was normal but reports that she has been having a ongoing history of dysphagia and would like to discuss today referral to G.I    Subjective    Joan Johnson is a 49 y.o. female who presents today as a new patient to establish care.  HPI HPI    New Patient (Initial Visit)    Comments: Patient presents in office today to establish care, she states that she has moved to [facility] from [place] 3 months ago. Patient states that she has been having a hard time adjusting since moving. Patient reports concern of elevated blood pressure and recurring migraines.Patient reports that she was in ED 2 weeks ago with complaint of coughing up blood, she states that chest xray was normal but reports that she has been having a ongoing history of dysphagia and would like to discuss today referral to G.I        Last edited by Fonda Kinder, CMA on 07/29/2020 10:39 AM. (History)      She reports history of GERD and she had an episode of eating and having trouble swallowing and feeling a globlus feeling and feeling food stuck. She went back to [facility] to see gastric MD, and she ended up going to the Emergency room , she had a soft tissue x ray was normal, she had steroid shot for inflamed throat.   She was told by Gastric MD to eat not after 7, take couple hours after eating before laying down.   She needs referral for endoscopy and colonoscopy.  She reports she is due for this.    She has a history of migraines, has had all her life, she has been taking magnesium for this and has tension. She does use reading glasses. She feels like this helps.  She dneies any change in her migraines, she has occasional visual changes with migraine as aura and has all of her life. She feels her neck is tense and massage helps this and headache.   Blood pressure has been elevated at last two visits in [place] over 140/90 and today in office and she desires medication, she has been taking some ginger and tumeric in lemon water to help as well.  No recent eye exam.   She seen pulmonary in past for pulmonary nodule and needs a pulmonary doctor here in [place]. Records request completed. She reports she is due for repeat CT of known pulmonary nodule unknown size one year ago. Denies any hemoptysis. Denies any rectal pain, melena or bleeding.    Patient  denies any fever, body aches,chills, rash, chest pain, shortness of breath, nausea, vomiting, or diarrhea.  Denies dizziness, lightheadedness, pre syncopal or syncopal episodes.    Past Medical History:  Diagnosis Date  . Allergy   . Arthritis   . Hypertension   . Kidney stone   .  Migraine    Past Surgical History:  Procedure Laterality Date  . ANKLE SURGERY    . AUGMENTATION MAMMAPLASTY     Breast Implant  . CESAREAN SECTION     1991, 1989, 1987  . COSMETIC SURGERY    . HERNIA REPAIR    . OTHER SURGICAL HISTORY     tummy tuck  . OTHER SURGICAL HISTORY     carpal tunnel  . TUBAL LIGATION     Family Status  Relation Name Status  . Mother  (Not Specified)  . Daughter  (Not Specified)   Family History  Problem Relation Age of Onset  . Heart murmur Mother   . Thyroid disease Daughter    Social History   Socioeconomic History  . Marital status: Married    Spouse name: Not on file  . Number of children: Not on file  . Years of education:  Not on file  . Highest education level: Not on file  Occupational History  . Not on file  Tobacco Use  . Smoking status: Former Smoker    Quit date: 03/29/2018    Years since quitting: 2.3  . Smokeless tobacco: Never Used  Vaping Use  . Vaping Use: Never used  Substance and Sexual Activity  . Alcohol use: Yes    Alcohol/week: 2.0 standard drinks    Types: 2 Glasses of wine per week  . Drug use: Never  . Sexual activity: Not Currently    Birth control/protection: Post-menopausal  Other Topics Concern  . Not on file  Social History Narrative  . Not on file   Social Determinants of Health   Financial Resource Strain: Not on file  Food Insecurity: Not on file  Transportation Needs: Not on file  Physical Activity: Not on file  Stress: Not on file  Social Connections: Not on file   Outpatient Medications Prior to Visit  Medication Sig  . Ascorbic Acid (VITAMIN C) 1000 MG tablet Take 1,000 mg by mouth daily.  Marland Kitchen atorvastatin (LIPITOR) 40 MG tablet Take 40 mg by mouth daily.  . Biotin 61950 MCG TABS Take by mouth.  . Cholecalciferol (VITAMIN D3) 125 MCG (5000 UT) TABS Take by mouth.  . Magnesium 500 MG TABS Take by mouth.  . pantoprazole (PROTONIX) 40 MG tablet Take 40 mg by mouth daily.   No facility-administered medications prior to visit.   Allergies  Allergen Reactions  . Shellfish Allergy Hives    Immunization History  Administered Date(s) Administered  . Moderna Sars-Covid-2 Vaccination 08/12/2019, 09/09/2019    Health Maintenance  Topic Date Due  . Hepatitis C Screening  Never done  . HIV Screening  Never done  . TETANUS/TDAP  Never done  . PAP SMEAR-Modifier  Never done  . COLONOSCOPY (Pts 45-51yrs Insurance coverage will need to be confirmed)  Never done  . MAMMOGRAM  Never done  . INFLUENZA VACCINE  Never done  . COVID-19 Vaccine (3 - Booster for Moderna series) 03/08/2020    Patient Care Team: Joanette Silveria, Eula Fried, FNP as PCP - General (Family  Medicine)  Review of Systems  Constitutional: Positive for fatigue. Negative for activity change, appetite change, chills, diaphoresis, fever and unexpected weight change.  HENT: Positive for trouble swallowing (improved since seeing [facility] - needs endosopy is what she was told. ).   Eyes: Negative.   Respiratory: Negative.   Cardiovascular: Negative.   Endocrine:       Hair loss.   Genitourinary: Positive for frequency. Negative for  difficulty urinating, dyspareunia, dysuria, enuresis and flank pain.  Musculoskeletal: Positive for myalgias and neck stiffness. Negative for arthralgias, back pain, gait problem, joint swelling and neck pain.  Skin: Negative.   Neurological: Negative.   Hematological: Negative.   Psychiatric/Behavioral: Negative for agitation, behavioral problems, self-injury, sleep disturbance and suicidal ideas. The patient is nervous/anxious.   All other systems reviewed and are negative.     Objective    BP (!) 143/89   Pulse 93   Temp 98 F (36.7 C) (Oral)   Resp 16   Ht 5\' 2"  (1.575 m)   Wt 199 lb 12.8 oz (90.6 kg)   SpO2 100%   BMI 36.54 kg/m  Physical Exam Vitals reviewed.  Constitutional:      General: She is not in acute distress.    Appearance: She is well-developed. She is not diaphoretic.     Interventions: She is not intubated. HENT:     Head: Normocephalic and atraumatic.     Right Ear: Tympanic membrane, ear canal and external ear normal. There is no impacted cerumen.     Left Ear: Tympanic membrane, ear canal and external ear normal. There is no impacted cerumen.     Nose: Nose normal. No congestion or rhinorrhea.     Mouth/Throat:     Pharynx: No oropharyngeal exudate or posterior oropharyngeal erythema.  Eyes:     General: Lids are normal. No scleral icterus.       Right eye: No discharge.        Left eye: No discharge.     Conjunctiva/sclera: Conjunctivae normal.     Right eye: Right conjunctiva is not injected. No exudate or  hemorrhage.    Left eye: Left conjunctiva is not injected. No exudate or hemorrhage.    Pupils: Pupils are equal, round, and reactive to light.  Neck:     Thyroid: No thyroid mass or thyromegaly.     Vascular: Normal carotid pulses. No carotid bruit, hepatojugular reflux or JVD.     Trachea: Trachea and phonation normal. No tracheal tenderness or tracheal deviation.     Meningeal: Brudzinski's sign and Kernig's sign absent.  Cardiovascular:     Rate and Rhythm: Normal rate and regular rhythm.     Pulses: Normal pulses.          Radial pulses are 2+ on the right side and 2+ on the left side.       Dorsalis pedis pulses are 2+ on the right side and 2+ on the left side.       Posterior tibial pulses are 2+ on the right side and 2+ on the left side.     Heart sounds: Normal heart sounds, S1 normal and S2 normal. Heart sounds not distant. No murmur heard. No friction rub. No gallop.   Pulmonary:     Effort: Pulmonary effort is normal. No tachypnea, bradypnea, accessory muscle usage or respiratory distress. She is not intubated.     Breath sounds: Normal breath sounds. No stridor. No wheezing or rales.  Chest:     Chest wall: No tenderness.  Breasts:     Right: No supraclavicular adenopathy.     Left: No supraclavicular adenopathy.    Abdominal:     General: Bowel sounds are normal. There is no distension or abdominal bruit.     Palpations: Abdomen is soft. There is no shifting dullness, fluid wave, hepatomegaly, splenomegaly, mass or pulsatile mass.     Tenderness: There is no abdominal tenderness. There  is no right CVA tenderness, left CVA tenderness, guarding or rebound.     Hernia: No hernia is present.  Musculoskeletal:        General: No swelling, tenderness, deformity or signs of injury. Normal range of motion.     Cervical back: Full passive range of motion without pain, normal range of motion and neck supple. No edema, erythema, rigidity or tenderness. No spinous process  tenderness or muscular tenderness. Normal range of motion.     Right lower leg: No edema.     Left lower leg: No edema.  Lymphadenopathy:     Head:     Right side of head: No submental, submandibular, tonsillar, preauricular, posterior auricular or occipital adenopathy.     Left side of head: No submental, submandibular, tonsillar, preauricular, posterior auricular or occipital adenopathy.     Cervical: No cervical adenopathy.     Right cervical: No superficial, deep or posterior cervical adenopathy.    Left cervical: No superficial, deep or posterior cervical adenopathy.     Upper Body:     Right upper body: No supraclavicular or pectoral adenopathy.     Left upper body: No supraclavicular or pectoral adenopathy.  Skin:    General: Skin is warm and dry.     Coloration: Skin is not jaundiced or pale.     Findings: No abrasion, bruising, burn, ecchymosis, erythema, lesion, petechiae or rash.     Nails: There is no clubbing.  Neurological:     Mental Status: She is alert and oriented to person, place, and time.     GCS: GCS eye subscore is 4. GCS verbal subscore is 5. GCS motor subscore is 6.     Cranial Nerves: No cranial nerve deficit.     Sensory: No sensory deficit.     Motor: No tremor, atrophy, abnormal muscle tone or seizure activity.     Coordination: Coordination normal.     Gait: Gait normal.     Deep Tendon Reflexes: Reflexes are normal and symmetric. Reflexes normal. Babinski sign absent on the right side. Babinski sign absent on the left side.     Reflex Scores:      Tricep reflexes are 2+ on the right side and 2+ on the left side.      Bicep reflexes are 2+ on the right side and 2+ on the left side.      Brachioradialis reflexes are 2+ on the right side and 2+ on the left side.      Patellar reflexes are 2+ on the right side and 2+ on the left side.      Achilles reflexes are 2+ on the right side and 2+ on the left side. Psychiatric:        Speech: Speech normal.         Behavior: Behavior normal.        Thought Content: Thought content normal.        Judgment: Judgment normal.     Depression Screen PHQ 2/9 Scores 07/29/2020  Exception Documentation Patient refusal   Results for orders placed or performed in visit on 07/29/20  POCT urinalysis dipstick  Result Value Ref Range   Color, UA yellow    Clarity, UA clear    Glucose, UA Negative Negative   Bilirubin, UA negative    Ketones, UA negative    Spec Grav, UA <=1.005 (A) 1.010 - 1.025   Blood, UA non hemolyzed trace    pH, UA 8.5 (A) 5.0 - 8.0  Protein, UA Negative Negative   Urobilinogen, UA 0.2 0.2 or 1.0 E.U./dL   Nitrite, UA negative    Leukocytes, UA Negative Negative   Appearance     Odor      Assessment & Plan       Gastroesophageal reflux disease, unspecified whether esophagitis present - Plan: CBC with Differential/Platelet, Comprehensive metabolic panel, Ambulatory referral to Gastroenterology  Vitamin D deficiency - Plan: VITAMIN D 25 Hydroxy (Vit-D Deficiency, Fractures)  B12 deficiency - Plan: B12  Dysphagia, unspecified type - Plan: Ambulatory referral to Gastroenterology, Ambulatory referral to Ophthalmology  Screening for blood or protein in urine - Plan: POCT urinalysis dipstick  Hx of migraines - Plan: Ambulatory referral to Ophthalmology  Lung nodule - Plan: Ambulatory referral to Pulmonology  Vision changes - Plan: Ambulatory referral to Ophthalmology  Fatigue, unspecified type - Plan: Lipid panel, TSH  Spasm of cervical paraspinous muscle - Plan: baclofen (LIORESAL) 10 MG tablet  Anxiety  Hematuria, unspecified type - Plan: Urine Microscopic  Hair loss - Plan: Fe+TIBC+Fer  Encounter for screening for COVID-19 - Plan: COVID-19, Flu A+B and RSV  Declines anxiety or depression counseling or medications, feels is adjusting to move. Will monitor.   Meds ordered this encounter  Medications  . baclofen (LIORESAL) 10 MG tablet    Sig: Take 1 tablet (10  mg total) by mouth at bedtime as needed for muscle spasms.    Dispense:  30 each    Refill:  0  . hydrochlorothiazide (HYDRODIURIL) 25 MG tablet    Sig: Take 1 tablet (25 mg total) by mouth daily.    Dispense:  90 tablet    Refill:  0   Continue magnesium and try baclofen for muscle spasms neck, no injury known. Labs fasting.  HCTZ start with( half of 25 mg tablet )  12.5mg  tablet po QD discussed.   Red Flags discussed. The patient was given clear instructions to go to ER or return to medical center if any red flags develop, symptoms do not improve, worsen or new problems develop. They verbalized understanding.   Return in about 3 weeks (around 08/19/2020), or if symptoms worsen or fail to improve, for at any time for any worsening symptoms, Go to Emergency room/ urgent care if worse.     I spent 45 minutes dedicated to the care of this patient on  the date of this encounter to include pre-visit review of records, face-to-face time with the  patient discussing The primary encounter diagnosis was Gastroesophageal reflux disease, unspecified whether esophagitis present. Diagnoses of Vitamin D deficiency, B12 deficiency, Dysphagia, unspecified type, Screening for blood or protein in urine, Hx of migraines, Lung nodule, Vision changes, Fatigue, unspecified type, Spasm of cervical paraspinous muscle, Anxiety, Hematuria, unspecified type, Hair loss, and Encounter for screening for COVID-19 were also pertinent to this visit.  and post visit ordering of testing.  The entirety of the information documented in the History of Present Illness, Review of Systems and Physical Exam were personally obtained by me. Portions of this information were initially documented by the CMA and reviewed by me for thoroughness and accuracy.      Marcille Buffy, [place] [phone] (phone) [phone] (fax)  [place]

## 2020-07-29 NOTE — Progress Notes (Signed)
Microscopic urine ordered

## 2020-07-30 LAB — URINALYSIS, MICROSCOPIC ONLY
Casts: NONE SEEN /lpf
RBC, Urine: NONE SEEN /hpf (ref 0–2)

## 2020-07-30 NOTE — Progress Notes (Signed)
Request urine culture please.

## 2020-07-31 LAB — CBC WITH DIFFERENTIAL/PLATELET
Basophils Absolute: 0.1 10*3/uL (ref 0.0–0.2)
Basos: 1 %
EOS (ABSOLUTE): 0.2 10*3/uL (ref 0.0–0.4)
Eos: 4 %
Hematocrit: 44 % (ref 34.0–46.6)
Hemoglobin: 14.4 g/dL (ref 11.1–15.9)
Immature Grans (Abs): 0 10*3/uL (ref 0.0–0.1)
Immature Granulocytes: 0 %
Lymphocytes Absolute: 1.8 10*3/uL (ref 0.7–3.1)
Lymphs: 27 %
MCH: 29.6 pg (ref 26.6–33.0)
MCHC: 32.7 g/dL (ref 31.5–35.7)
MCV: 90 fL (ref 79–97)
Monocytes Absolute: 0.7 10*3/uL (ref 0.1–0.9)
Monocytes: 11 %
Neutrophils Absolute: 3.8 10*3/uL (ref 1.4–7.0)
Neutrophils: 57 %
Platelets: 310 10*3/uL (ref 150–450)
RBC: 4.87 x10E6/uL (ref 3.77–5.28)
RDW: 12.2 % (ref 11.7–15.4)
WBC: 6.6 10*3/uL (ref 3.4–10.8)

## 2020-07-31 LAB — COMPREHENSIVE METABOLIC PANEL
ALT: 20 IU/L (ref 0–32)
AST: 17 IU/L (ref 0–40)
Albumin/Globulin Ratio: 1.8 (ref 1.2–2.2)
Albumin: 4.4 g/dL (ref 3.8–4.8)
Alkaline Phosphatase: 83 IU/L (ref 44–121)
BUN/Creatinine Ratio: 17 (ref 9–23)
BUN: 15 mg/dL (ref 6–24)
Bilirubin Total: 1 mg/dL (ref 0.0–1.2)
CO2: 22 mmol/L (ref 20–29)
Calcium: 9.6 mg/dL (ref 8.7–10.2)
Chloride: 103 mmol/L (ref 96–106)
Creatinine, Ser: 0.87 mg/dL (ref 0.57–1.00)
GFR calc Af Amer: 90 mL/min/{1.73_m2} (ref >59)
GFR calc non Af Amer: 78 mL/min/{1.73_m2} (ref >59)
Globulin, Total: 2.4 g/dL (ref 1.5–4.5)
Glucose: 91 mg/dL (ref 65–99)
Potassium: 4.4 mmol/L (ref 3.5–5.2)
Sodium: 141 mmol/L (ref 134–144)
Total Protein: 6.8 g/dL (ref 6.0–8.5)

## 2020-07-31 LAB — LIPID PANEL
Chol/HDL Ratio: 4.2 ratio (ref 0.0–4.4)
Cholesterol, Total: 194 mg/dL (ref 100–199)
HDL: 46 mg/dL (ref >39)
LDL Chol Calc (NIH): 129 mg/dL — ABNORMAL HIGH (ref 0–99)
Triglycerides: 103 mg/dL (ref 0–149)
VLDL Cholesterol Cal: 19 mg/dL (ref 5–40)

## 2020-07-31 LAB — IRON,TIBC AND FERRITIN PANEL
Ferritin: 168 ng/mL — ABNORMAL HIGH (ref 15–150)
Iron Saturation: 24 % (ref 15–55)
Iron: 71 ug/dL (ref 27–159)
Total Iron Binding Capacity: 300 ug/dL (ref 250–450)
UIBC: 229 ug/dL (ref 131–425)

## 2020-07-31 LAB — VITAMIN D 25 HYDROXY (VIT D DEFICIENCY, FRACTURES): Vit D, 25-Hydroxy: 23.3 ng/mL — ABNORMAL LOW (ref 30.0–100.0)

## 2020-07-31 LAB — TSH: TSH: 1.38 u[IU]/mL (ref 0.450–4.500)

## 2020-07-31 LAB — VITAMIN B12: Vitamin B-12: 362 pg/mL (ref 232–1245)

## 2020-08-01 LAB — COVID-19, FLU A+B AND RSV
Influenza A, NAA: NOT DETECTED
Influenza B, NAA: NOT DETECTED
RSV, NAA: NOT DETECTED
SARS-CoV-2, NAA: NOT DETECTED

## 2020-08-04 ENCOUNTER — Other Ambulatory Visit: Payer: Self-pay | Admitting: Adult Health

## 2020-08-04 ENCOUNTER — Encounter: Payer: Self-pay | Admitting: Adult Health

## 2020-08-04 ENCOUNTER — Telehealth: Payer: Self-pay

## 2020-08-04 DIAGNOSIS — E559 Vitamin D deficiency, unspecified: Secondary | ICD-10-CM

## 2020-08-04 MED ORDER — VITAMIN D (ERGOCALCIFEROL) 1.25 MG (50000 UNIT) PO CAPS: 50000.0000 [IU] | ORAL_CAPSULE | ORAL | 0 refills | Status: DC

## 2020-08-04 NOTE — Telephone Encounter (Signed)
Copied from CRM [id number]. Topic: General - Inquiry >> Aug 03, 2020 12:55 PM Daphine Deutscher D wrote: Reason for CRM: Pt called for her lab results from last week.  CB#  [phone]

## 2020-08-04 NOTE — Progress Notes (Signed)
Cbc within normal limits. CMP within normal limits.  TSH within normal limits.  LDL elevated.  Discuss lifestyle modification with patient e.g. increase exercise, fiber, fruits, vegetables, lean meat, and omega 3/fish intake and decrease saturated fat.  If patient following strict diet and exercise program already please schedule follow up appointment with primary care physician.   Vitamin D is low, recommend Vitamin D prescription at 50,000 units by mouth once every 7 days ( once weekly)  for 12 weeks then recheck lab 1- 2 weeks after completion.  Discontinue any other over the counter or prescription vitamin D.  Meds ordered this encounter Medications  Vitamin D, Ergocalciferol, (DRISDOL) 1.25 MG (50000 UNIT) CAPS capsule   Sig: Take 1 capsule (50,000 Units total) by mouth every 7 (seven) days. (taking one tablet per week) walk in lab in office 1-2 weeks after completing prescription.   Dispense:  12 capsule   Refill:  0   B12 within normal limits.  Sars, flu and rsv negative.

## 2020-08-04 NOTE — Telephone Encounter (Signed)
Patient was advised of labs results.She reports that she stopped her Atorvastatin a week ago because she run out. She takes Atorvastatin 10mg   And if you can send it to [facility] pharmacy in [facility]. She also would like to have a refill on the Pantoprazole 40mg .  She asked what is the next step for her hair problem. Do you recommend some vitamins OTC?

## 2020-08-04 NOTE — Progress Notes (Signed)
Meds ordered this encounter  Medications  . Vitamin D, Ergocalciferol, (DRISDOL) 1.25 MG (50000 UNIT) CAPS capsule    Sig: Take 1 capsule (50,000 Units total) by mouth every 7 (seven) days. (taking one tablet per week) walk in lab in office 1-2 weeks after completing prescription.    Dispense:  12 capsule    Refill:  0   Orders Placed This Encounter  Procedures  . VITAMIN D 25 Hydroxy (Vit-D Deficiency, Fractures)   

## 2020-08-05 ENCOUNTER — Other Ambulatory Visit: Payer: Self-pay | Admitting: Adult Health

## 2020-08-05 MED ORDER — PANTOPRAZOLE SODIUM 40 MG PO TBEC: 40.0000 mg | DELAYED_RELEASE_TABLET | Freq: Every day | ORAL | 3 refills | Status: DC

## 2020-08-05 MED ORDER — ATORVASTATIN CALCIUM 40 MG PO TABS: 40.0000 mg | ORAL_TABLET | Freq: Every day | ORAL | 3 refills | Status: DC

## 2020-08-05 NOTE — Telephone Encounter (Signed)
Meds ordered this encounter  Medications   atorvastatin (LIPITOR) 40 MG tablet    Sig: Take 1 tablet (40 mg total) by mouth daily.    Dispense:  90 tablet    Refill:  3   pantoprazole (PROTONIX) 40 MG tablet    Sig: Take 1 tablet (40 mg total) by mouth daily.    Dispense:  90 tablet    Refill:  3  Sent to pharmacy.  Her labs were ok would recommend a multivitamin that is whole foods based to try for a few months and if not helping after 3- 4  months or anything worsening see dermatologist can refer.

## 2020-08-05 NOTE — Telephone Encounter (Cosign Needed)
Please verify Atorvastatin dosage we have 40 mg listed in epic from first visit if it is Atorvastatin  10mg  ok to send on once daily.  See below message.

## 2020-08-05 NOTE — Progress Notes (Signed)
Meds ordered this encounter  Medications  . atorvastatin (LIPITOR) 40 MG tablet    Sig: Take 1 tablet (40 mg total) by mouth daily.    Dispense:  90 tablet    Refill:  3  . pantoprazole (PROTONIX) 40 MG tablet    Sig: Take 1 tablet (40 mg total) by mouth daily.    Dispense:  90 tablet    Refill:  3

## 2020-08-06 ENCOUNTER — Other Ambulatory Visit: Payer: Self-pay | Admitting: Adult Health

## 2020-08-06 MED ORDER — ATORVASTATIN CALCIUM 10 MG PO TABS: 10.0000 mg | ORAL_TABLET | Freq: Every day | ORAL | 1 refills | Status: DC

## 2020-08-06 NOTE — Telephone Encounter (Signed)
Provider called patient 08/06/2020 and did verify Atorvastatin is 10mg , reviewed lab results with patient. She has already picked up Vitamin D - recheck lab 3 months. See lab note reviewed. She has had some hair loss, will start good whole foods based multivitamin to try for 3 months - will readdress at next visit and sooner if needed.   She declined any need for refills currently  as she has one left to get from [facility] and is going to be there.

## 2020-08-24 ENCOUNTER — Telehealth: Payer: Self-pay

## 2020-08-24 NOTE — Telephone Encounter (Signed)
Sent mychart message to patient advising her to contact [facility] to have insurance resubmitted since diagnose code used was correct. KW

## 2020-08-24 NOTE — Telephone Encounter (Signed)
Copied from [place] [phone]. Topic: General - Inquiry >> Aug 21, 2020  2:08 PM Joan Johnson wrote: Reason for CRM: Pt was given a covid test from the office and received a bill for $266 and was advised by her insurance to call the office and have them refile/ resubmit this with the correct code/form

## 2020-08-25 NOTE — Telephone Encounter (Signed)
Noted  

## 2020-09-01 ENCOUNTER — Other Ambulatory Visit: Payer: Self-pay

## 2020-09-01 ENCOUNTER — Encounter: Payer: Self-pay | Admitting: Adult Health

## 2020-09-01 ENCOUNTER — Ambulatory Visit: Payer: 59 | Admitting: Adult Health

## 2020-09-01 ENCOUNTER — Ambulatory Visit: Payer: Commercial Managed Care - PPO | Admitting: Adult Health

## 2020-09-01 VITALS — BP 124/77 | HR 97 | Resp 16 | Wt 196.2 lb

## 2020-09-01 DIAGNOSIS — I10 Essential (primary) hypertension: Secondary | ICD-10-CM | POA: Insufficient documentation

## 2020-09-01 DIAGNOSIS — K219 Gastro-esophageal reflux disease without esophagitis: Secondary | ICD-10-CM | POA: Diagnosis not present

## 2020-09-01 DIAGNOSIS — E559 Vitamin D deficiency, unspecified: Secondary | ICD-10-CM

## 2020-09-01 DIAGNOSIS — L659 Nonscarring hair loss, unspecified: Secondary | ICD-10-CM | POA: Insufficient documentation

## 2020-09-01 DIAGNOSIS — J309 Allergic rhinitis, unspecified: Secondary | ICD-10-CM | POA: Insufficient documentation

## 2020-09-01 DIAGNOSIS — J3089 Other allergic rhinitis: Secondary | ICD-10-CM | POA: Diagnosis not present

## 2020-09-01 MED ORDER — PANTOPRAZOLE SODIUM 40 MG PO TBEC
40.0000 mg | DELAYED_RELEASE_TABLET | Freq: Every day | ORAL | 2 refills | Status: DC
Start: 2020-09-01 — End: 2020-09-22

## 2020-09-01 MED ORDER — FLUTICASONE PROPIONATE 50 MCG/ACT NA SUSP: 2.0000 | Freq: Every day | NASAL | 6 refills | Status: DC

## 2020-09-01 MED ORDER — HYDROCHLOROTHIAZIDE 25 MG PO TABS
25.0000 mg | ORAL_TABLET | Freq: Every day | ORAL | 1 refills | Status: DC
Start: 2020-09-01 — End: 2020-09-22

## 2020-09-01 MED ORDER — SALINE SPRAY 0.65 % NA SOLN: 1.0000 | NASAL | 0 refills | Status: DC | PRN

## 2020-09-01 NOTE — Progress Notes (Signed)
Established patient visit   Patient: Joan Johnson   DOB: 07-21-1971   50 y.o. Female  MRN: [id number] Visit Date: 09/01/2020  Today's healthcare provider: Jairo Ben, FNP   Chief Complaint  Patient presents with  . Hypertension   Subjective    HPI  Hypertension, follow-up  BP Readings from Last 3 Encounters:  09/01/20 124/77  07/29/20 (!) 143/89  05/20/20 120/80   Wt Readings from Last 3 Encounters:  09/01/20 196 lb 3.2 oz (89 kg)  07/29/20 199 lb 12.8 oz (90.6 kg)  05/20/20 206 lb (93.4 kg)     She was last seen for hypertension 1 months ago.  BP at that visit was 143/89. Management since that visit includes started patient on HCTZ 25mg .  She reports excellent compliance with treatment. She is not having side effects.  She is following a Regular diet. She is exercising. She does not smoke. Headaches have improved since blood pressure control.   She has had chronic rhinitis and post nasal drip since moving to [place]. She is not taking any allergy medications. She has some frontal sinus pressure and lots of post nasal drip she reports.  Denies cough.   Use of agents associated with hypertension: none.   Outside blood pressures are not being checked . Symptoms: No chest pain No chest pressure  No palpitations No syncope  No dyspnea No orthopnea  No paroxysmal nocturnal dyspnea No lower extremity edema   Pertinent labs: Lab Results  Component Value Date   CHOL 194 07/30/2020   HDL 46 07/30/2020   LDLCALC 129 (H) 07/30/2020   TRIG 103 07/30/2020   CHOLHDL 4.2 07/30/2020   Lab Results  Component Value Date   NA 141 07/30/2020   K 4.4 07/30/2020   CREATININE 0.87 07/30/2020   GFRNONAA 78 07/30/2020   GFRAA 90 07/30/2020   GLUCOSE 91 07/30/2020      She has had covid she believes she had covid , her IGG was positive.  Has noticed hair loss.   Patient  denies any fever, body aches,chills, rash, chest pain, shortness of breath, nausea,  vomiting, or diarrhea.  Denies dizziness, lightheadedness, pre syncopal or syncopal episodes.    The 10-year ASCVD risk score Denman George DC Montez Hageman., et al., 2013) is: 1.9%   ---------------------------------------------------------------------------------------------------  Patient Active Problem List   Diagnosis Date Noted  . Hx of migraines 07/29/2020  . Gastroesophageal reflux disease 07/29/2020  . Dysphagia 07/29/2020  . Screening for blood or protein in urine 07/29/2020  . B12 deficiency 07/29/2020  . Vitamin D deficiency 07/29/2020  . Lung nodule 07/29/2020  . Vision changes 07/29/2020  . Fatigue 07/29/2020  . Spasm of cervical paraspinous muscle 07/29/2020   Past Medical History:  Diagnosis Date  . Allergy   . Arthritis   . Hypertension   . Kidney stone   . Migraine    Allergies  Allergen Reactions  . Shellfish Allergy Hives       Medications: Outpatient Medications Prior to Visit  Medication Sig  . Ascorbic Acid (VITAMIN C) 1000 MG tablet Take 1,000 mg by mouth daily.  Marland Kitchen atorvastatin (LIPITOR) 10 MG tablet Take 1 tablet (10 mg total) by mouth daily.  . baclofen (LIORESAL) 10 MG tablet Take 1 tablet (10 mg total) by mouth at bedtime as needed for muscle spasms.  . Biotin 44315 MCG TABS Take by mouth.  . Magnesium 500 MG TABS Take by mouth.  . Vitamin D, Ergocalciferol, (DRISDOL) 1.25 MG (  50000 UNIT) CAPS capsule Take 1 capsule (50,000 Units total) by mouth every 7 (seven) days. (taking one tablet per week) walk in lab in office 1-2 weeks after completing prescription.  . [DISCONTINUED] hydrochlorothiazide (HYDRODIURIL) 25 MG tablet Take 1 tablet (25 mg total) by mouth daily.  . [DISCONTINUED] pantoprazole (PROTONIX) 40 MG tablet Take 1 tablet (40 mg total) by mouth daily.   No facility-administered medications prior to visit.    Review of Systems  Constitutional: Negative for activity change, appetite change, chills, diaphoresis, fatigue, fever and unexpected weight  change.  HENT: Positive for congestion, postnasal drip, rhinorrhea and sinus pressure. Negative for dental problem, drooling, ear discharge, ear pain, facial swelling, hearing loss, mouth sores, nosebleeds, sinus pain, sneezing, sore throat, tinnitus, trouble swallowing and voice change.   Respiratory: Negative.   Cardiovascular: Negative.   Gastrointestinal: Negative.   Genitourinary: Negative.   Musculoskeletal: Negative.  Negative for arthralgias.  Skin: Negative.   Neurological: Positive for headaches (improved still having frontal pressure. ). Negative for dizziness, tremors, seizures, syncope, facial asymmetry, speech difficulty, weakness, light-headedness and numbness.  Hematological: Negative.   Psychiatric/Behavioral: Negative.     Last CBC Lab Results  Component Value Date   WBC 6.6 07/30/2020   HGB 14.4 07/30/2020   HCT 44.0 07/30/2020   MCV 90 07/30/2020   MCH 29.6 07/30/2020   RDW 12.2 07/30/2020   PLT 310 16/05/9603   Last metabolic panel Lab Results  Component Value Date   GLUCOSE 91 07/30/2020   NA 141 07/30/2020   K 4.4 07/30/2020   CL 103 07/30/2020   CO2 22 07/30/2020   BUN 15 07/30/2020   CREATININE 0.87 07/30/2020   GFRNONAA 78 07/30/2020   GFRAA 90 07/30/2020   CALCIUM 9.6 07/30/2020   PROT 6.8 07/30/2020   ALBUMIN 4.4 07/30/2020   LABGLOB 2.4 07/30/2020   AGRATIO 1.8 07/30/2020   BILITOT 1.0 07/30/2020   ALKPHOS 83 07/30/2020   AST 17 07/30/2020   ALT 20 07/30/2020   Last lipids Lab Results  Component Value Date   CHOL 194 07/30/2020   HDL 46 07/30/2020   LDLCALC 129 (H) 07/30/2020   TRIG 103 07/30/2020   CHOLHDL 4.2 07/30/2020   Last hemoglobin A1c No results found for: HGBA1C Last thyroid functions Lab Results  Component Value Date   TSH 1.380 07/30/2020   Last vitamin D Lab Results  Component Value Date   VD25OH 23.3 (L) 07/30/2020   Last vitamin B12 and Folate Lab Results  Component Value Date   VITAMINB12 362 07/30/2020        Objective    BP 124/77   Pulse 97   Resp 16   Wt 196 lb 3.2 oz (89 kg)   SpO2 100%   BMI 35.89 kg/m  BP Readings from Last 3 Encounters:  09/01/20 124/77  07/29/20 (!) 143/89  05/20/20 120/80   Wt Readings from Last 3 Encounters:  09/01/20 196 lb 3.2 oz (89 kg)  07/29/20 199 lb 12.8 oz (90.6 kg)  05/20/20 206 lb (93.4 kg)       Physical Exam Vitals reviewed.  Constitutional:      General: She is not in acute distress.    Appearance: She is well-developed. She is not diaphoretic.     Interventions: She is not intubated. HENT:     Head: Normocephalic and atraumatic.     Right Ear: External ear normal.     Left Ear: External ear normal.     Nose: Nose  normal.     Mouth/Throat:     Pharynx: No oropharyngeal exudate.  Eyes:     General: Lids are normal. No scleral icterus.       Right eye: No discharge.        Left eye: No discharge.     Conjunctiva/sclera: Conjunctivae normal.     Right eye: Right conjunctiva is not injected. No exudate or hemorrhage.    Left eye: Left conjunctiva is not injected. No exudate or hemorrhage.    Pupils: Pupils are equal, round, and reactive to light.  Neck:     Thyroid: No thyroid mass or thyromegaly.     Vascular: Normal carotid pulses. No carotid bruit, hepatojugular reflux or JVD.     Trachea: Trachea and phonation normal. No tracheal tenderness or tracheal deviation.     Meningeal: Brudzinski's sign and Kernig's sign absent.  Cardiovascular:     Rate and Rhythm: Normal rate and regular rhythm.     Pulses: Normal pulses.          Radial pulses are 2+ on the right side and 2+ on the left side.       Dorsalis pedis pulses are 2+ on the right side and 2+ on the left side.       Posterior tibial pulses are 2+ on the right side and 2+ on the left side.     Heart sounds: Normal heart sounds, S1 normal and S2 normal. Heart sounds not distant. No murmur heard. No friction rub. No gallop.   Pulmonary:     Effort: Pulmonary effort  is normal. No tachypnea, bradypnea, accessory muscle usage or respiratory distress. She is not intubated.     Breath sounds: Normal breath sounds. No stridor. No wheezing or rales.  Chest:     Chest wall: No tenderness.  Breasts:     Right: No supraclavicular adenopathy.     Left: No supraclavicular adenopathy.    Abdominal:     General: Bowel sounds are normal. There is no distension or abdominal bruit.     Palpations: Abdomen is soft. There is no shifting dullness, fluid wave, hepatomegaly, splenomegaly, mass or pulsatile mass.     Tenderness: There is no abdominal tenderness. There is no right CVA tenderness, left CVA tenderness, guarding or rebound.     Hernia: No hernia is present.  Musculoskeletal:        General: No tenderness or deformity. Normal range of motion.     Cervical back: Full passive range of motion without pain, normal range of motion and neck supple. No edema, erythema or rigidity. No spinous process tenderness or muscular tenderness. Normal range of motion.  Lymphadenopathy:     Head:     Right side of head: No submental, submandibular, tonsillar, preauricular, posterior auricular or occipital adenopathy.     Left side of head: No submental, submandibular, tonsillar, preauricular, posterior auricular or occipital adenopathy.     Cervical: No cervical adenopathy.     Right cervical: No superficial, deep or posterior cervical adenopathy.    Left cervical: No superficial, deep or posterior cervical adenopathy.     Upper Body:     Right upper body: No supraclavicular or pectoral adenopathy.     Left upper body: No supraclavicular or pectoral adenopathy.  Skin:    General: Skin is warm and dry.     Coloration: Skin is not pale.     Findings: No abrasion, bruising, burn, ecchymosis, erythema, lesion, petechiae or rash.  Nails: There is no clubbing.  Neurological:     General: No focal deficit present.     Mental Status: She is alert and oriented to person, place,  and time.     GCS: GCS eye subscore is 4. GCS verbal subscore is 5. GCS motor subscore is 6.     Cranial Nerves: No cranial nerve deficit.     Sensory: No sensory deficit.     Motor: No weakness, tremor, atrophy, abnormal muscle tone or seizure activity.     Coordination: Coordination normal.     Gait: Gait normal.     Deep Tendon Reflexes: Reflexes are normal and symmetric. Reflexes normal. Babinski sign absent on the right side. Babinski sign absent on the left side.     Reflex Scores:      Tricep reflexes are 2+ on the right side and 2+ on the left side.      Bicep reflexes are 2+ on the right side and 2+ on the left side.      Brachioradialis reflexes are 2+ on the right side and 2+ on the left side.      Patellar reflexes are 2+ on the right side and 2+ on the left side.      Achilles reflexes are 2+ on the right side and 2+ on the left side. Psychiatric:        Mood and Affect: Mood normal.        Speech: Speech normal.        Behavior: Behavior normal.        Thought Content: Thought content normal.        Judgment: Judgment normal.      No results found for any visits on 09/01/20.  Assessment & Plan     Hair loss - Plan: Ambulatory referral to Dermatology, CBC With Differential, Comprehensive Metabolic Panel (CMET)  Allergic rhinitis due to other allergic trigger, unspecified seasonality - Plan: fluticasone (FLONASE) 50 MCG/ACT nasal spray, sodium chloride (OCEAN) 0.65 % SOLN nasal spray  Hypertension, unspecified type - Plan: CBC With Differential, Comprehensive Metabolic Panel (CMET)  Gastroesophageal reflux disease, unspecified whether esophagitis present  Vitamin D deficiency - Plan: VITAMIN D 25 Hydroxy (Vit-D Deficiency, Fractures)  Meds ordered this encounter  Medications  . fluticasone (FLONASE) 50 MCG/ACT nasal spray    Sig: Place 2 sprays into both nostrils daily.    Dispense:  16 g    Refill:  6  . hydrochlorothiazide (HYDRODIURIL) 25 MG tablet    Sig:  Take 1 tablet (25 mg total) by mouth daily.    Dispense:  90 tablet    Refill:  1  . sodium chloride (OCEAN) 0.65 % SOLN nasal spray    Sig: Place 1 spray into both nostrils as needed for congestion.    Dispense:  88 mL    Refill:  0  . pantoprazole (PROTONIX) 40 MG tablet    Sig: Take 1 tablet (40 mg total) by mouth daily.    Dispense:  90 tablet    Refill:  2   Orders Placed This Encounter  Procedures  . CBC With Differential  . Comprehensive Metabolic Panel (CMET)  . VITAMIN D 25 Hydroxy (Vit-D Deficiency, Fractures)  . Ambulatory referral to Dermatology   Red Flags discussed. The patient was given clear instructions to go to ER or return to medical center if any red flags develop, symptoms do not improve, worsen or new problems develop. They verbalized understanding.  Return in about 3 months (  around 11/29/2020), or if symptoms worsen or fail to improve, for at any time for any worsening symptoms, Go to Emergency room/ urgent care if worse.     The entirety of the information documented in the History of Present Illness, Review of Systems and Physical Exam were personally obtained by me. Portions of this information were initially documented by the CMA and reviewed by me for thoroughness and accuracy.     Marcille Buffy, [place] [phone] (phone) [phone] (fax)  [place]

## 2020-09-01 NOTE — Patient Instructions (Addendum)
Allergic Rhinitis, Adult Allergic rhinitis is a reaction to allergens. Allergens are things that can cause an allergic reaction. This condition affects the lining inside the nose (mucous membrane). There are two types of allergic rhinitis:  Seasonal. This type is also called hay fever. It happens only during some times of the year.  Perennial. This type can happen at any time of the year. This condition cannot be spread from person to person (is not contagious). It can be mild, worse, or very bad. It can develop at any age and may be outgrown. What are the causes? This condition may be caused by:  Pollen from grasses, trees, and weeds.  Dust mites.  Smoke.  Mold.  Car fumes.  The pee (urine), spit, or dander of pets. Dander is dead skin cells from a pet.   What increases the risk? You are more likely to develop this condition if:  You have allergies in your family.  You have problems like allergies in your family. You may have: ? Swelling of parts of your eyes and eyelids. ? Asthma. This affects how you breathe. ? Long-term redness and swelling on your skin. ? Food allergies. What are the signs or symptoms? The main symptom of this condition is a runny or stuffy nose (nasal congestion). Other symptoms may include:  Sneezing or coughing.  Itching and tearing of your eyes.  Mucus that drips down the back of your throat (postnasal drip).  Trouble sleeping.  Feeling tired.  Headache.  Sore throat. How is this treated? There is no cure for this condition. You should avoid things that you are allergic to. Treatment can help to relieve symptoms. This may include:  Medicines that block allergy symptoms, such as corticosteroids or antihistamines. These may be given as a shot, nasal spray, or pill.  Avoiding things you are allergic to.  Medicines that give you bits of what you are allergic to over time. This is called immunotherapy. It is done if other treatments do  not help. You may get: ? Shots. ? Medicine under your tongue.  Stronger medicines, if other treatments do not help. Follow these instructions at home: Avoiding allergens Find out what things you are allergic to and avoid them. To do this, try these things:  If you get allergies any time of year: ? Replace carpet with wood, tile, or vinyl flooring. Carpet can trap pet dander and dust. ? Do not smoke. Do not allow smoking in your home. ? Change your heating and air conditioning filters at least once a month.  If you get allergies only some times of the year: ? Keep windows closed when you can. ? Plan things to do outside when pollen counts are lowest. Check pollen counts before you plan things to do outside. ? When you come indoors, change your clothes and shower before you sit on furniture or bedding.   If you are allergic to a pet: ? Keep the pet out of your bedroom. ? Vacuum, sweep, and dust often.   General instructions  Take over-the-counter and prescription medicines only as told by your doctor.  Drink enough fluid to keep your pee (urine) pale yellow.  Keep all follow-up visits as told by your doctor. This is important. Where to find more information  American Academy of Allergy, Asthma & Immunology: www.aaaai.org Contact a doctor if:  You have a fever.  You get a cough that does not go away.  You make whistling sounds when you breathe (wheeze).  Your symptoms slow you down.  Your symptoms stop you from doing your normal things each day. Get help right away if:  You are short of breath. This symptom may be an emergency. Do not wait to see if the symptom will go away. Get medical help right away. Call your local emergency services (911 in the [place] Do not drive yourself to the hospital. Summary  Allergic rhinitis may be treated by taking medicines and avoiding things you are allergic to.  If you have allergies only some of the year, keep windows closed when  you can at those times.  Contact your doctor if you get a fever or a cough that does not go away. This information is not intended to replace advice given to you by your health care provider. Make sure you discuss any questions you have with your health care provider. Document Revised: 09/09/2019 Document Reviewed: 07/16/2019 Elsevier Patient Education  2021 [employer].  Sinus Headache  A sinus headache happens when your sinuses get swollen or blocked (clogged). Sinuses are spaces behind the bones of your face and forehead. You may feel pain or pressure in your face, forehead, ears, or upper teeth. Sinus headaches can be mild or very bad. Follow these instructions at home: General instructions  If told: ? Apply a warm, moist washcloth to your face. This can help to lessen pain. ? Use a nasal saline wash. Follow the directions on the bottle or box. Hydrate and humidify  Drink enough water to keep your pee (urine) pale yellow.  Use a cool mist humidifier to keep the humidity level in your home above 50%.  Breathe in steam for 10-15 minutes, 3-4 times a day or as told by your doctor. You can do this in the bathroom while a hot shower is running.  Try not to spend time in cool or dry air. Medicines  Take over-the-counter and prescription medicines only as told by your doctor.  If you were prescribed an antibiotic medicine, take it as told by your doctor. Do not stop taking it even if you start to feel better.  Use a nose spray if your nose feels full of mucus (congested).   Contact a doctor if:  You get more than one headache a week.  Light or sound bothers you.  You have a fever.  You feel sick to your stomach (nauseous) or you throw up (vomit).  Your headaches do not get better with treatment. Get help right away if:  You have trouble seeing.  You suddenly have very bad pain in your face or head.  You start to have quick, sudden movements or shaking that you cannot  control (seizure).  You are confused.  You have a stiff neck. Summary  A sinus headache happens when your sinuses get swollen or blocked (clogged). Sinuses are spaces behind the bones of your face and forehead.  You may feel pain or pressure in your face, forehead, ears, or upper teeth.  Take over-the-counter and prescription medicines only as told by your doctor.  If told, apply a warm, moist washcloth to your face. This can help to lessen pain. This information is not intended to replace advice given to you by your health care provider. Make sure you discuss any questions you have with your health care provider. Document Revised: 04/28/2020 Document Reviewed: 04/28/2020 Elsevier Patient Education  2021 [employer]. Fluticasone Nasal Spray What is this medicine? FLUTICASONE (floo TIK a sone) is a nasal corticosteroid. It helps decrease  inflammation in your nose. This medicine is used to treat the symptoms of allergies like sneezing, itching, and runny or stuffy nose. This medicine is also used to treat nasal polyps. This medicine may be used for other purposes; ask your health care provider or pharmacist if you have questions. COMMON BRAND NAME(S): ClariSpray, Flonase, Flonase Allergy Relief, Flonase Sensimist, Veramyst, XHANCE What should I tell my health care provider before I take this medicine? They need to know if you have any of these conditions:  eye disease, vision problems  infection, like tuberculosis, herpes, or fungal infection  recent surgery on nose or sinuses  taking a corticosteroid by mouth  an unusual or allergic reaction to fluticasone, steroids, other medicines, foods, dyes, or preservatives  pregnant or trying to get pregnant  breast-feeding How should I use this medicine? This medicine is for use in the nose. Follow the directions on your prescription or product label. Do not use more often than directed. Do not share this medicine with anyone else.  Make sure that you are using your nasal spray correctly. Ask you doctor or health care provider if you have any questions. This medicine comes with INSTRUCTIONS FOR USE. Ask your pharmacist for directions on how to use this medicine. Read the information carefully. Talk to your pharmacist or health care provider if you have questions. Talk to your health care provider about the use of this medicine in children. While it may be prescribed to children as young as 2 years for selected conditions, precautions do apply. Overdosage: If you think you have taken too much of this medicine contact a poison control center or emergency room at once. NOTE: This medicine is only for you. Do not share this medicine with others. What if I miss a dose? If you miss a dose, use it as soon as you remember. If it is almost time for your next dose, use only that dose and continue with your regular schedule. Do not use double or extra doses. What may interact with this medicine?  certain antibiotics like clarithromycin and telithromycin  certain medicines for fungal infections like ketoconazole, itraconazole, and voriconazole  conivaptan  nefazodone  some medicines for HIV  vaccines This list may not describe all possible interactions. Give your health care provider a list of all the medicines, herbs, non-prescription drugs, or dietary supplements you use. Also tell them if you smoke, drink alcohol, or use illegal drugs. Some items may interact with your medicine. What should I watch for while using this medicine? Visit your healthcare professional for regular checks on your progress. Tell your healthcare professional if your symptoms do not start to get better or if they get worse. This medicine may increase your risk of getting an infection. Tell your doctor or health care professional if you are around anyone with measles or chickenpox, or if you develop sores or blisters that do not heal properly. What side  effects may I notice from receiving this medicine? Side effects that you should report to your doctor or health care professional as soon as possible:  allergic reactions like skin rash, itching or hives, swelling of the face, lips, or tongue  changes in vision  crusting or sores in the nose  nosebleed  signs and symptoms of infection like fever or chills; cough; sore throat  white patches or sores in the mouth or nose Side effects that usually do not require medical attention (report to your doctor or health care professional if they continue or  are bothersome):  burning or irritation inside the nose or throat  changes in taste or smell  cough  headache This list may not describe all possible side effects. Call your doctor for medical advice about side effects. You may report side effects to FDA at 1-800-FDA-1088. Where should I keep my medicine? Keep out of the reach of children and pets. Store at room temperature between 15 and 25 degrees C (59 and 77 degrees F). Protect from light. Get rid of any unused medicine after the expiration date. To get rid of medicines that are no longer needed or have expired:  Take the medicine to a medicine take-back program. Check with your pharmacy or law enforcement to find a location.  If you cannot return the medicine, ask your pharmacist or health care provider how to get rid of this medicine safely. NOTE: This sheet is a summary. It may not cover all possible information. If you have questions about this medicine, talk to your doctor, pharmacist, or health care provider.  2021 Elsevier/Gold Standard (2020-05-27 16:47:44) Cetirizine tablets What is this medicine? CETIRIZINE (se TI ra zeen) is an antihistamine. This medicine is used to treat or prevent symptoms of allergies. It is also used to help reduce itchy skin rash and hives. This medicine may be used for other purposes; ask your health care provider or pharmacist if you have  questions. COMMON BRAND NAME(S): All Day Allergy, Allergy Relief, Zyrtec, Zyrtec Hives Relief What should I tell my health care provider before I take this medicine? They need to know if you have any of these conditions:  kidney disease  liver disease  an unusual or allergic reaction to cetirizine, hydroxyzine, other medicines, foods, dyes, or preservatives  pregnant or trying to get pregnant  breast-feeding How should I use this medicine? Take this medicine by mouth with a glass of water. Follow the directions on the prescription label. You can take this medicine with food or on an empty stomach. Take your medicine at regular times. Do not take more often than directed. You may need to take this medicine for several days before your symptoms improve. Talk to your pediatrician regarding the use of this medicine in children. Special care may be needed. While this drug may be prescribed for children as young as 33 years of age for selected conditions, precautions do apply. Overdosage: If you think you have taken too much of this medicine contact a poison control center or emergency room at once. NOTE: This medicine is only for you. Do not share this medicine with others. What if I miss a dose? If you miss a dose, take it as soon as you can. If it is almost time for your next dose, take only that dose. Do not take double or extra doses. What may interact with this medicine?  alcohol  certain medicines for anxiety or sleep  narcotic medicines for pain  other medicines for colds or allergies This list may not describe all possible interactions. Give your health care provider a list of all the medicines, herbs, non-prescription drugs, or dietary supplements you use. Also tell them if you smoke, drink alcohol, or use illegal drugs. Some items may interact with your medicine. What should I watch for while using this medicine? Visit your doctor or health care professional for regular checks on  your health. Tell your doctor if your symptoms do not improve. You may get drowsy or dizzy. Do not drive, use machinery, or do anything that needs mental  alertness until you know how this medicine affects you. Do not stand or sit up quickly, especially if you are an older patient. This reduces the risk of dizzy or fainting spells. Your mouth may get dry. Chewing sugarless gum or sucking hard candy, and drinking plenty of water may help. Contact your doctor if the problem does not go away or is severe. What side effects may I notice from receiving this medicine? Side effects that you should report to your doctor or health care professional as soon as possible:  allergic reactions like skin rash, itching or hives, swelling of the face, lips, or tongue  changes in vision or hearing  fast or irregular heartbeat  trouble passing urine or change in the amount of urine Side effects that usually do not require medical attention (report to your doctor or health care professional if they continue or are bothersome):  dizziness  dry mouth  irritability  sore throat  stomach pain  tiredness This list may not describe all possible side effects. Call your doctor for medical advice about side effects. You may report side effects to FDA at 1-800-FDA-1088. Where should I keep my medicine? Keep out of the reach of children. Store at room temperature between 15 and 30 degrees C (59 and 86 degrees F). Throw away any unused medicine after the expiration date. NOTE: This sheet is a summary. It may not cover all possible information. If you have questions about this medicine, talk to your doctor, pharmacist, or health care provider.  2021 Elsevier/Gold Standard (2014-08-12 13:44:42) Hypertension, Adult Hypertension is another name for high blood pressure. High blood pressure forces your heart to work harder to pump blood. This can cause problems over time. There are two numbers in a blood pressure  reading. There is a top number (systolic) over a bottom number (diastolic). It is best to have a blood pressure that is below 120/80. Healthy choices can help lower your blood pressure, or you may need medicine to help lower it. What are the causes? The cause of this condition is not known. Some conditions may be related to high blood pressure. What increases the risk?  Smoking.  Having type 2 diabetes mellitus, high cholesterol, or both.  Not getting enough exercise or physical activity.  Being overweight.  Having too much fat, sugar, calories, or salt (sodium) in your diet.  Drinking too much alcohol.  Having long-term (chronic) kidney disease.  Having a family history of high blood pressure.  Age. Risk increases with age.  Race. You may be at higher risk if you are African American.  Gender. Men are at higher risk than women before age 23. After age 8, women are at higher risk than men.  Having obstructive sleep apnea.  Stress. What are the signs or symptoms?  High blood pressure may not cause symptoms. Very high blood pressure (hypertensive crisis) may cause: ? Headache. ? Feelings of worry or nervousness (anxiety). ? Shortness of breath. ? Nosebleed. ? A feeling of being sick to your stomach (nausea). ? Throwing up (vomiting). ? Changes in how you see. ? Very bad chest pain. ? Seizures. How is this treated?  This condition is treated by making healthy lifestyle changes, such as: ? Eating healthy foods. ? Exercising more. ? Drinking less alcohol.  Your health care provider may prescribe medicine if lifestyle changes are not enough to get your blood pressure under control, and if: ? Your top number is above 130. ? Your bottom number is above  80.  Your personal target blood pressure may vary. Follow these instructions at home: Eating and drinking  If told, follow the DASH eating plan. To follow this plan: ? Fill one half of your plate at each meal with  fruits and vegetables. ? Fill one fourth of your plate at each meal with whole grains. Whole grains include whole-wheat pasta, brown rice, and whole-grain bread. ? Eat or drink low-fat dairy products, such as skim milk or low-fat yogurt. ? Fill one fourth of your plate at each meal with low-fat (lean) proteins. Low-fat proteins include fish, chicken without skin, eggs, beans, and tofu. ? Avoid fatty meat, cured and processed meat, or chicken with skin. ? Avoid pre-made or processed food.  Eat less than 1,500 mg of salt each day.  Do not drink alcohol if: ? Your doctor tells you not to drink. ? You are pregnant, may be pregnant, or are planning to become pregnant.  If you drink alcohol: ? Limit how much you use to:  0-1 drink a day for women.  0-2 drinks a day for men. ? Be aware of how much alcohol is in your drink. In the [place] one drink equals one 12 oz bottle of beer (355 mL), one 5 oz glass of wine (148 mL), or one 1 oz glass of hard liquor (44 mL).   Lifestyle  Work with your doctor to stay at a healthy weight or to lose weight. Ask your doctor what the best weight is for you.  Get at least 30 minutes of exercise most days of the week. This may include walking, swimming, or biking.  Get at least 30 minutes of exercise that strengthens your muscles (resistance exercise) at least 3 days a week. This may include lifting weights or doing Pilates.  Do not use any products that contain nicotine or tobacco, such as cigarettes, e-cigarettes, and chewing tobacco. If you need help quitting, ask your doctor.  Check your blood pressure at home as told by your doctor.  Keep all follow-up visits as told by your doctor. This is important.   Medicines  Take over-the-counter and prescription medicines only as told by your doctor. Follow directions carefully.  Do not skip doses of blood pressure medicine. The medicine does not work as well if you skip doses. Skipping doses also puts you at  risk for problems.  Ask your doctor about side effects or reactions to medicines that you should watch for. Contact a doctor if you:  Think you are having a reaction to the medicine you are taking.  Have headaches that keep coming back (recurring).  Feel dizzy.  Have swelling in your ankles.  Have trouble with your vision. Get help right away if you:  Get a very bad headache.  Start to feel mixed up (confused).  Feel weak or numb.  Feel faint.  Have very bad pain in your: ? Chest. ? Belly (abdomen).  Throw up more than once.  Have trouble breathing. Summary  Hypertension is another name for high blood pressure.  High blood pressure forces your heart to work harder to pump blood.  For most people, a normal blood pressure is less than 120/80.  Making healthy choices can help lower blood pressure. If your blood pressure does not get lower with healthy choices, you may need to take medicine. This information is not intended to replace advice given to you by your health care provider. Make sure you discuss any questions you have with your health care  provider. Document Revised: 03/28/2018 Document Reviewed: 03/28/2018 Elsevier Patient Education  2021 [employer].

## 2020-09-05 ENCOUNTER — Other Ambulatory Visit: Payer: Self-pay

## 2020-09-05 ENCOUNTER — Emergency Department: Payer: 59

## 2020-09-05 ENCOUNTER — Encounter: Payer: Self-pay | Admitting: Radiology

## 2020-09-05 ENCOUNTER — Emergency Department
Admission: EM | Admit: 2020-09-05 | Discharge: 2020-09-05 | Disposition: A | Discharge status: Home / Self Care | Payer: 59 | Attending: Emergency Medicine | Admitting: Emergency Medicine

## 2020-09-05 DIAGNOSIS — R2242 Localized swelling, mass and lump, left lower limb: Secondary | ICD-10-CM | POA: Diagnosis present

## 2020-09-05 DIAGNOSIS — M25472 Effusion, left ankle: Secondary | ICD-10-CM

## 2020-09-05 DIAGNOSIS — Z87891 Personal history of nicotine dependence: Secondary | ICD-10-CM | POA: Diagnosis not present

## 2020-09-05 DIAGNOSIS — Z79899 Other long term (current) drug therapy: Secondary | ICD-10-CM | POA: Diagnosis not present

## 2020-09-05 DIAGNOSIS — I10 Essential (primary) hypertension: Secondary | ICD-10-CM | POA: Diagnosis not present

## 2020-09-05 LAB — URIC ACID: Uric Acid, Serum: 6.1 mg/dL (ref 2.5–7.1)

## 2020-09-05 MED ORDER — MELOXICAM 7.5 MG PO TABS
15.0000 mg | ORAL_TABLET | Freq: Once | ORAL | Status: AC
  Administered 2020-09-05: 15 mg via ORAL
  Filled 2020-09-05: qty 2

## 2020-09-05 MED ORDER — MELOXICAM 15 MG PO TABS: 15.0000 mg | ORAL_TABLET | Freq: Every day | ORAL | 0 refills | Status: DC

## 2020-09-05 NOTE — ED Triage Notes (Signed)
Pt states yesterday she noticed left lateral bruising noted to left ankle with some swelling. Pt denies pain, is ambulatory without difficulty. Cms intact.

## 2020-09-05 NOTE — ED Provider Notes (Signed)
[facility] Emergency Department Provider Note  ____________________________________________  Time seen: Approximately 8:24 PM  I have reviewed the triage vital signs and the nursing notes.   HISTORY  Chief Complaint Joint Swelling    HPI Joan Johnson is a 50 y.o. female who presents the emergency department complaining of left ankle edema.  Patient states that she has chronic edema of the right ankle due to a previous surgery.  She has no reported trauma.  There is no erythema reported.  Patient denies any pain but noted edema and lateral ecchymosis beginning today.  Patient states that she spends a a lot of the time of the day on her feet.  She has not had a history of gout.  No history of septic arthritis.  She does believe that she has arthritis of the left ankle but is never had it imaged.  No medications for this complaint prior to arrival.  Patient denies any other complaints at this time.         Past Medical History:  Diagnosis Date  . Allergy   . Arthritis   . Hypertension   . Kidney stone   . Migraine     Patient Active Problem List   Diagnosis Date Noted  . Allergic rhinitis 09/01/2020  . Hair loss 09/01/2020  . Hypertension 09/01/2020  . Hx of migraines 07/29/2020  . Gastroesophageal reflux disease 07/29/2020  . Dysphagia 07/29/2020  . Screening for blood or protein in urine 07/29/2020  . B12 deficiency 07/29/2020  . Vitamin D deficiency 07/29/2020  . Lung nodule 07/29/2020  . Vision changes 07/29/2020  . Fatigue 07/29/2020  . Spasm of cervical paraspinous muscle 07/29/2020    Past Surgical History:  Procedure Laterality Date  . ANKLE SURGERY    . AUGMENTATION MAMMAPLASTY     Breast Implant  . [place]  . COSMETIC SURGERY    . HERNIA REPAIR    . OTHER SURGICAL HISTORY     tummy tuck  . OTHER SURGICAL HISTORY     carpal tunnel  . TUBAL LIGATION      Prior to Admission medications    Medication Sig Start Date End Date Taking? Authorizing Provider  meloxicam (MOBIC) 15 MG tablet Take 1 tablet (15 mg total) by mouth daily. 09/05/20  Yes Cailah Reach, Charline Bills, PA-C  Ascorbic Acid (VITAMIN C) 1000 MG tablet Take 1,000 mg by mouth daily.    [provider]  atorvastatin (LIPITOR) 10 MG tablet Take 1 tablet (10 mg total) by mouth daily. 08/06/20   Flinchum, Kelby Aline, FNP  baclofen (LIORESAL) 10 MG tablet Take 1 tablet (10 mg total) by mouth at bedtime as needed for muscle spasms. 07/29/20   Flinchum, Kelby Aline, FNP  Biotin 10000 MCG TABS Take by mouth.    [provider]  fluticasone (FLONASE) 50 MCG/ACT nasal spray Place 2 sprays into both nostrils daily. 09/01/20   Flinchum, Kelby Aline, FNP  hydrochlorothiazide (HYDRODIURIL) 25 MG tablet Take 1 tablet (25 mg total) by mouth daily. 09/01/20   Flinchum, Kelby Aline, FNP  Magnesium 500 MG TABS Take by mouth.    [provider]  pantoprazole (PROTONIX) 40 MG tablet Take 1 tablet (40 mg total) by mouth daily. 09/01/20   Flinchum, Kelby Aline, FNP  sodium chloride (OCEAN) 0.65 % SOLN nasal spray Place 1 spray into both nostrils as needed for congestion. 09/01/20   Flinchum, Kelby Aline, FNP  Vitamin D, Ergocalciferol, (DRISDOL)  1.25 MG (50000 UNIT) CAPS capsule Take 1 capsule (50,000 Units total) by mouth every 7 (seven) days. (taking one tablet per week) walk in lab in office 1-2 weeks after completing prescription. 08/04/20   Flinchum, Eula Fried, FNP    Allergies Shellfish allergy  Family History  Problem Relation Age of Onset  . Heart murmur Mother   . Thyroid disease Daughter     Social History Social History   Tobacco Use  . Smoking status: Former Smoker    Quit date: 03/29/2018    Years since quitting: 2.4  . Smokeless tobacco: Never Used  Vaping Use  . Vaping Use: Never used  Substance Use Topics  . Alcohol use: Yes    Alcohol/week: 2.0 standard drinks    Types: 2 Glasses of wine per week  . Drug  use: Never     Review of Systems  Constitutional: No fever/chills Eyes: No visual changes. No discharge ENT: No upper respiratory complaints. Cardiovascular: no chest pain. Respiratory: no cough. No SOB. Gastrointestinal: No abdominal pain.  No nausea, no vomiting.  No diarrhea.  No constipation. Musculoskeletal: Nontraumatic left ankle edema and ecchymosis Skin: Negative for rash, abrasions, lacerations, ecchymosis. Neurological: Negative for headaches, focal weakness or numbness.  10 System ROS otherwise negative.  ____________________________________________   PHYSICAL EXAM:  VITAL SIGNS: ED Triage Vitals [09/05/20 1910]  Enc Vitals Group     BP (!) 164/84     Pulse Rate 100     Resp 16     Temp 98 F (36.7 C)     Temp Source Oral     SpO2 100 %     Weight 196 lb (88.9 kg)     Height 5\' 3"  (1.6 m)     Head Circumference      Peak Flow      Pain Score 0     Pain Loc      Pain Edu?      Excl. in GC?      Constitutional: Alert and oriented. Well appearing and in no acute distress. Eyes: Conjunctivae are normal. PERRL. EOMI. Head: Atraumatic. ENT:      Ears:       Nose: No congestion/rhinnorhea.      Mouth/Throat: Mucous membranes are moist.  Neck: No stridor.    Cardiovascular: Normal rate, regular rhythm. Normal S1 and S2.  Good peripheral circulation. Respiratory: Normal respiratory effort without tachypnea or retractions. Lungs CTAB. Good air entry to the bases with no decreased or absent breath sounds. Musculoskeletal: Full range of motion to all extremities. No gross deformities appreciated.  Visualization of the left ankle reveals minimal edema when compared with the right side.  There is 1 small area of ecchymosis measuring approximately 1 cm in diameter overlying the lateral malleolus.  There is no other ecchymosis.  No open wounds.  No petechial rash identified.  Patient with good range of motion.  There is mild ballottement along the anterolateral aspect  of the ankle with no other visible or palpable findings.  Good dorsalis pedis pulse.  Good sensation distally. Neurologic:  Normal speech and language. No gross focal neurologic deficits are appreciated.  Skin:  Skin is warm, dry and intact. No rash noted. Psychiatric: Mood and affect are normal. Speech and behavior are normal. Patient exhibits appropriate insight and judgement.   ____________________________________________   LABS (all labs ordered are listed, but only abnormal results are displayed)  Labs Reviewed  URIC ACID   ____________________________________________  EKG  ____________________________________________  RADIOLOGY I personally viewed and evaluated these images as part of my medical decision making, as well as reviewing the written report by the radiologist.  ED Provider Interpretation: Soft tissue edema without acute underlying osseous abnormality  DG Ankle Complete Left  Result Date: 09/05/2020 CLINICAL DATA:  Nontraumatic left ankle edema, bruising EXAM: LEFT ANKLE COMPLETE - 3+ VIEW COMPARISON:  None. FINDINGS: Frontal, oblique, lateral views of the left ankle are obtained. No fracture, subluxation, or dislocation. Joint spaces are well preserved. There is mild lateral soft tissue swelling. IMPRESSION: 1. Lateral soft tissue swelling.  No acute bony abnormality. Electronically Signed   By: Randa Ngo M.D.   On: 09/05/2020 20:49    ____________________________________________    PROCEDURES  Procedure(s) performed:    Procedures    Medications  meloxicam (MOBIC) tablet 15 mg (15 mg Oral Given 09/05/20 2244)     ____________________________________________   INITIAL IMPRESSION / ASSESSMENT AND PLAN / ED COURSE  Pertinent labs & imaging results that were available during my care of the patient were reviewed by me and considered in my medical decision making (see chart for details).  Review of the [place] CSRS was performed in accordance of the  [place] prior to dispensing any controlled drugs.           Patient's diagnosis is consistent with edema of the left ankle.  Patient presented to the emergency department nontraumatic edema of the left ankle.  This was very minimal in nature.  There was a small area of ecchymosis lying over the lateral malleolus.  Again no trauma.  No pain.  No tenderness on exam.  Exam is overall reassuring.  Imaging revealed no acute underlying osseous abnormality.  Uric acid level was normal.  No evidence of cellulitis.  At this time I feel this is an overuse injury as patient spends the majority of her day on her feet.  Patiently placed on an anti-inflammatory for symptom improvement.  Return precautions given to the patient.  Otherwise follow-up primary care..  Patient is given ED precautions to return to the ED for any worsening or new symptoms.     ____________________________________________  FINAL CLINICAL IMPRESSION(S) / ED DIAGNOSES  Final diagnoses:  Edema of left ankle      NEW MEDICATIONS STARTED DURING THIS VISIT:  ED Discharge Orders         Ordered    meloxicam (MOBIC) 15 MG tablet  Daily        09/05/20 2219              This chart was dictated using voice recognition software/Dragon. Despite best efforts to proofread, errors can occur which can change the meaning. Any change was purely unintentional.    Darletta Moll, PA-C 09/05/20 2259    Lucrezia Starch, MD 09/06/20 [phone]

## 2020-09-05 NOTE — ED Notes (Signed)
ED Provider at bedside. 

## 2020-09-05 NOTE — ED Notes (Signed)
Pt reports L lateral ankle bruising and L ankle swelling x 1 day without injury. Pt reports she is on her feet a lot.  Denies pain at this time.

## 2020-09-09 ENCOUNTER — Encounter: Payer: Self-pay | Admitting: Physician Assistant

## 2020-09-09 ENCOUNTER — Ambulatory Visit: Payer: Self-pay | Admitting: *Deleted

## 2020-09-09 ENCOUNTER — Ambulatory Visit: Payer: 59 | Admitting: Physician Assistant

## 2020-09-09 ENCOUNTER — Other Ambulatory Visit: Payer: Self-pay

## 2020-09-09 VITALS — BP 142/86 | HR 100 | Wt 192.0 lb

## 2020-09-09 DIAGNOSIS — Z6834 Body mass index (BMI) 34.0-34.9, adult: Secondary | ICD-10-CM

## 2020-09-09 DIAGNOSIS — I10 Essential (primary) hypertension: Secondary | ICD-10-CM | POA: Diagnosis not present

## 2020-09-09 DIAGNOSIS — R600 Localized edema: Secondary | ICD-10-CM

## 2020-09-09 DIAGNOSIS — E6609 Other obesity due to excess calories: Secondary | ICD-10-CM | POA: Diagnosis not present

## 2020-09-09 DIAGNOSIS — E7800 Pure hypercholesterolemia, unspecified: Secondary | ICD-10-CM | POA: Diagnosis not present

## 2020-09-09 NOTE — Patient Instructions (Signed)
Krill oil for cholesterol  Beet root for blood pressure  Ok to stop atorvastatin, stop HCTZ (hydrochlorothiazide)

## 2020-09-09 NOTE — Progress Notes (Signed)
Established patient visit   Patient: Joan Johnson   DOB: 08-08-1970   50 y.o. Female  MRN: [id number] Visit Date: 09/09/2020  Today's healthcare provider: Mar Daring, PA-C   Chief Complaint  Patient presents with  . Edema    Left ankle swollen and bruised.     Subjective    HPI HPI    Edema     Additional comments: Left ankle swollen and bruised.         Last edited by Kizzie Furnish, CMA on 09/09/2020 11:12 AM. (History)      Follow up ER visit  Patient was seen in ER for edema of left ankle on 09/05/2020. She was treated for Edema . Treatment for this included left ankle xray, showed soft tissue swelling; labs-unremarkable (normal uric acid); started on meloxicam 15mg  (advised to take daily x 2 weeks) . She reports good compliance with treatment. She reports this condition is Unchanged.  -----------------------------------------------------------------------------------------    Patient Active Problem List   Diagnosis Date Noted  . Allergic rhinitis 09/01/2020  . Hair loss 09/01/2020  . Hypertension 09/01/2020  . Hx of migraines 07/29/2020  . Gastroesophageal reflux disease 07/29/2020  . Dysphagia 07/29/2020  . Screening for blood or protein in urine 07/29/2020  . B12 deficiency 07/29/2020  . Vitamin D deficiency 07/29/2020  . Lung nodule 07/29/2020  . Vision changes 07/29/2020  . Fatigue 07/29/2020  . Spasm of cervical paraspinous muscle 07/29/2020   Past Medical History:  Diagnosis Date  . Allergy   . Arthritis   . Hypertension   . Kidney stone   . Migraine    Social History   Tobacco Use  . Smoking status: Former Smoker    Quit date: 03/29/2018    Years since quitting: 2.4  . Smokeless tobacco: Never Used  Vaping Use  . Vaping Use: Never used  Substance Use Topics  . Alcohol use: Yes    Alcohol/week: 2.0 standard drinks    Types: 2 Glasses of wine per week  . Drug use: Never   Allergies  Allergen Reactions  .  Shellfish Allergy Hives     Medications: Outpatient Medications Prior to Visit  Medication Sig  . Ascorbic Acid (VITAMIN C) 1000 MG tablet Take 1,000 mg by mouth daily.  Marland Kitchen atorvastatin (LIPITOR) 10 MG tablet Take 1 tablet (10 mg total) by mouth daily.  . baclofen (LIORESAL) 10 MG tablet Take 1 tablet (10 mg total) by mouth at bedtime as needed for muscle spasms.  . Biotin 10000 MCG TABS Take by mouth.  . fluticasone (FLONASE) 50 MCG/ACT nasal spray Place 2 sprays into both nostrils daily.  . hydrochlorothiazide (HYDRODIURIL) 25 MG tablet Take 1 tablet (25 mg total) by mouth daily.  . Magnesium 500 MG TABS Take by mouth.  . meloxicam (MOBIC) 15 MG tablet Take 1 tablet (15 mg total) by mouth daily.  . pantoprazole (PROTONIX) 40 MG tablet Take 1 tablet (40 mg total) by mouth daily.  . sodium chloride (OCEAN) 0.65 % SOLN nasal spray Place 1 spray into both nostrils as needed for congestion.  . Vitamin D, Ergocalciferol, (DRISDOL) 1.25 MG (50000 UNIT) CAPS capsule Take 1 capsule (50,000 Units total) by mouth every 7 (seven) days. (taking one tablet per week) walk in lab in office 1-2 weeks after completing prescription.   No facility-administered medications prior to visit.    Review of Systems      Objective    There were no vitals taken  for this visit.   Physical Exam    No results found for any visits on 09/09/20.  Assessment & Plan     1. Edema of soft tissue of left ankle region Unchanged. Has only been on Meloxciam 15mg  x 3 days. Does have swelling on the right ankle in the exact same spot just over and distal to the lateral malleolus. Has history of surgery on the right. Now also mentions she is having plantar fasciitis in the right foot as well (pain over the plantar heel that radiates to ball of foot). Reports she was seen for the plantar fasciitis in [place] before moving here and it comes and goes. Will have her complete the 2 week meloxicam and f/u in 4 weeks. If still present  may need foot and ankle.   2. Primary hypertension Patient reports she wants to try natural things. Wants to stop HCTZ. Feels it may be contributing to ankle swelling and to sores in mouth. Advised she could try beet root to see if it may help. F/U in 4 weeks.   3. Hypercholesterolemia Patient feels atorvastatin is causing hair loss. Wants to stop. Discussed using Krill oil for cholesterol.  4. Class 1 obesity due to excess calories with serious comorbidity and body mass index (BMI) of 34.0 to 34.9 in adult Discussed dietary changes she can make such as limiting fatty foods, red meats, processed meats, sugars and eating lower sodium diet. Also can try intermittent fasting as well.    No follow-ups on file.      Reynolds Bowl, PA-C, have reviewed all documentation for this visit. The documentation on 09/09/20 for the exam, diagnosis, procedures, and orders are all accurate and complete.   Rubye Beach  [facility] [phone] (phone) [phone] (fax)  [place]

## 2020-09-09 NOTE — Telephone Encounter (Signed)
Summary: medication advice   PT called in asking if she should continue her blood pressure medication because she is developing sores in her mouth and her feet are swollen. PT believes it may be related to her medication. I offered her an appt to be seen on 09/10/20 but she declined and wanted to get some advice today. [phone]      Swelling in left foot- bruise. Patient did go to ED- Xray and gout testing negative. Yesterday patient notice mouth sores and chapped lips. Patient is concerned about BP medication side effects. These can be side effects of HCTZ- patient has been taking 4 months.  Reason for Disposition . [1] Thigh, calf, or ankle swelling AND [2] only 1 side  Answer Assessment - Initial Assessment Questions 1. ONSET: "When did the swelling start?" (e.g., minutes, hours, days)     Saturday morning 2. LOCATION: "What part of the leg is swollen?"  "Are both legs swollen or just one leg?"     Left foot- ankle swelling, R foot- puffy 3. SEVERITY: "How bad is the swelling?" (e.g., localized; mild, moderate, severe)  - Localized - small area of swelling localized to one leg  - MILD pedal edema - swelling limited to foot and ankle, pitting edema < 1/4 inch (6 mm) deep, rest and elevation eliminate most or all swelling  - MODERATE edema - swelling of lower leg to knee, pitting edema > 1/4 inch (6 mm) deep, rest and elevation only partially reduce swelling  - SEVERE edema - swelling extends above knee, facial or hand swelling present      mild 4. REDNESS: "Does the swelling look red or infected?"     Left ankle has bruise on outer ankle- quarter size- purple 5. PAIN: "Is the swelling painful to touch?" If Yes, ask: "How painful is it?"   (Scale 1-10; mild, moderate or severe)     Pain on bottom of heel- no pain at ankle 6. FEVER: "Do you have a fever?" If Yes, ask: "What is it, how was it measured, and when did it start?"      No fever 7. CAUSE: "What do you think is causing the leg  swelling?"     Not sure 8. MEDICAL HISTORY: "Do you have a history of heart failure, kidney disease, liver failure, or cancer?"     no 9. RECURRENT SYMPTOM: "Have you had leg swelling before?" If Yes, ask: "When was the last time?" "What happened that time?"     No- elevated, epsom salt did help a little 10. OTHER SYMPTOMS: "Do you have any other symptoms?" (e.g., chest pain, difficulty breathing)       Mouth sores- yesterday- red 11. PREGNANCY: "Is there any chance you are pregnant?" "When was your last menstrual period?"       n/a  Protocols used: LEG SWELLING AND EDEMA-A-AH

## 2020-09-10 ENCOUNTER — Other Ambulatory Visit: Payer: Self-pay

## 2020-09-10 ENCOUNTER — Telehealth: Payer: Self-pay | Admitting: Pulmonary Disease

## 2020-09-10 ENCOUNTER — Encounter: Payer: Self-pay | Admitting: Gastroenterology

## 2020-09-10 ENCOUNTER — Ambulatory Visit (INDEPENDENT_AMBULATORY_CARE_PROVIDER_SITE_OTHER): Payer: 59 | Admitting: Gastroenterology

## 2020-09-10 ENCOUNTER — Ambulatory Visit (INDEPENDENT_AMBULATORY_CARE_PROVIDER_SITE_OTHER): Payer: 59 | Admitting: Pulmonary Disease

## 2020-09-10 ENCOUNTER — Encounter: Payer: Self-pay | Admitting: Pulmonary Disease

## 2020-09-10 VITALS — BP 139/94 | HR 84 | Ht 62.0 in | Wt 182.0 lb

## 2020-09-10 VITALS — BP 134/78 | HR 88 | Temp 97.7°F | Ht 63.0 in | Wt 192.0 lb

## 2020-09-10 DIAGNOSIS — R198 Other specified symptoms and signs involving the digestive system and abdomen: Secondary | ICD-10-CM

## 2020-09-10 DIAGNOSIS — Z87891 Personal history of nicotine dependence: Secondary | ICD-10-CM | POA: Diagnosis not present

## 2020-09-10 DIAGNOSIS — Z8739 Personal history of other diseases of the musculoskeletal system and connective tissue: Secondary | ICD-10-CM

## 2020-09-10 DIAGNOSIS — Z1211 Encounter for screening for malignant neoplasm of colon: Secondary | ICD-10-CM

## 2020-09-10 DIAGNOSIS — IMO0001 Reserved for inherently not codable concepts without codable children: Secondary | ICD-10-CM

## 2020-09-10 DIAGNOSIS — R0989 Other specified symptoms and signs involving the circulatory and respiratory systems: Secondary | ICD-10-CM | POA: Insufficient documentation

## 2020-09-10 DIAGNOSIS — R911 Solitary pulmonary nodule: Secondary | ICD-10-CM

## 2020-09-10 MED ORDER — NA SULFATE-K SULFATE-MG SULF 17.5-3.13-1.6 GM/177ML PO SOLN: 1.0000 | Freq: Once | ORAL | 0 refills | Status: AC

## 2020-09-10 MED ORDER — NA SULFATE-K SULFATE-MG SULF 17.5-3.13-1.6 GM/177ML PO SOLN: 1.0000 | Freq: Once | ORAL | 0 refills | Status: DC

## 2020-09-10 NOTE — Telephone Encounter (Signed)
Record request has been faxed to [facility] at [phone].  Will await records.

## 2020-09-10 NOTE — Patient Instructions (Signed)
We will enroll you in the lung nodule clinic to follow-up on your small nodule.  Currently this is nothing of concern.  This is very small.  You will need imaging again in September of this year.  This will be May known to the lung nodule clinic.  We are getting records from your rheumatologist office.  Most of the symptoms you are having perhaps are related to flareup of rheumatoid arthritis which is a systemic illness.  We will see you in follow-up in a years time call sooner should any new problems arise.

## 2020-09-10 NOTE — Progress Notes (Signed)
Joan Darby, MD [address]  [place]  [place], [place] [address]  Main: [phone]  Fax: [phone]    Gastroenterology Consultation  Referring Provider:     Sharmon Leyden* Primary Care Physician:  Doreen Beam, FNP Primary Gastroenterologist:  Dr. Cephas Johnson Reason for Consultation:     Colon cancer screening, globus sensation        HPI:   Joan Johnson is a 50 y.o. female referred by Dr. Doreen Beam, FNP  for consultation & management of globus sensation.  Patient moved from [place] to [place] last year.  It was a big adjustment for her.  She has history of anxiety, associated with dryness of mouth that has resulted in globus sensation.  She saw a gastroenterologist in [place], who recommended upper endoscopy.  Patient did not undergo any procedure yet.  She is currently taking Protonix 40 mg daily.  She also reports that since moving to [place], patient has been sedentary.  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  She was also experiencing severe constipation which has now resolved.  She currently stays active, cut back on carbohydrates, trying to lose weight.  Her labs are unremarkable  She does not smoke, occasional alcohol use  NSAIDs: None  Antiplts/Anticoagulants/Anti thrombotics: None  GI Procedures: Colonoscopy more than 10 years ago, reportedly normal She denies family history of GI malignancy  Past Medical History:  Diagnosis Date  . Allergy   . Arthritis   . Hypertension   . Kidney stone   . Migraine     Past Surgical History:  Procedure Laterality Date  . ANKLE SURGERY    . AUGMENTATION MAMMAPLASTY     Breast Implant  . [place]  . COSMETIC SURGERY    . HERNIA REPAIR    . OTHER SURGICAL HISTORY     tummy tuck  . OTHER SURGICAL HISTORY     carpal tunnel  . TUBAL LIGATION      Current Outpatient Medications:  .  Ascorbic Acid (VITAMIN  C) 1000 MG tablet, Take 1,000 mg by mouth daily., Disp: , Rfl:  .  baclofen (LIORESAL) 10 MG tablet, Take 1 tablet (10 mg total) by mouth at bedtime as needed for muscle spasms., Disp: 30 each, Rfl: 0 .  fluticasone (FLONASE) 50 MCG/ACT nasal spray, Place 2 sprays into both nostrils daily., Disp: 16 g, Rfl: 6 .  Magnesium 500 MG TABS, Take by mouth., Disp: , Rfl:  .  meloxicam (MOBIC) 15 MG tablet, Take 1 tablet (15 mg total) by mouth daily., Disp: 30 tablet, Rfl: 0 .  pantoprazole (PROTONIX) 40 MG tablet, Take 1 tablet (40 mg total) by mouth daily., Disp: 90 tablet, Rfl: 2 .  sodium chloride (OCEAN) 0.65 % SOLN nasal spray, Place 1 spray into both nostrils as needed for congestion., Disp: 88 mL, Rfl: 0 .  Vitamin D, Ergocalciferol, (DRISDOL) 1.25 MG (50000 UNIT) CAPS capsule, Take 1 capsule (50,000 Units total) by mouth every 7 (seven) days. (taking one tablet per week) walk in lab in office 1-2 weeks after completing prescription., Disp: 12 capsule, Rfl: 0 .  atorvastatin (LIPITOR) 10 MG tablet, Take 1 tablet (10 mg total) by mouth daily. (Patient not taking: No sig reported), Disp: 90 tablet, Rfl: 1 .  hydrochlorothiazide (HYDRODIURIL) 25 MG tablet, Take 1 tablet (25 mg total) by mouth daily. (Patient not  taking: No sig reported), Disp: 90 tablet, Rfl: 1 .  Na Sulfate-K Sulfate-Mg Sulf 17.5-3.13-1.6 GM/177ML SOLN, Take 1 kit by mouth once for 1 dose., Disp: 354 mL, Rfl: 0   Family History  Problem Relation Age of Onset  . Heart murmur Mother   . Thyroid disease Daughter      Social History   Tobacco Use  . Smoking status: Former Smoker    Packs/day: 0.50    Years: 21.00    Pack years: 10.50    Types: Cigarettes    Quit date: 03/29/2018    Years since quitting: 2.4  . Smokeless tobacco: Never Used  Vaping Use  . Vaping Use: Never used  Substance Use Topics  . Alcohol use: Yes    Alcohol/week: 2.0 standard drinks    Types: 2 Glasses of wine per week  . Drug use: Never     Allergies as of 09/10/2020 - Review Complete 09/10/2020  Allergen Reaction Noted  . Shellfish allergy Hives 05/20/2020    Review of Systems:    All systems reviewed and negative except where noted in HPI.   Physical Exam:  BP (!) 139/94 (BP Location: Left Arm, Patient Position: Sitting, Cuff Size: Normal)   Pulse 84   Ht '5\' 2"'  (1.575 m)   Wt 182 lb (82.6 kg)   BMI 33.29 kg/m  No LMP recorded. Patient is postmenopausal.  General:   Alert,  Well-developed, well-nourished, pleasant and cooperative in NAD Head:  Normocephalic and atraumatic. Eyes:  Sclera clear, no icterus.   Conjunctiva pink. Ears:  Normal auditory acuity. Nose:  No deformity, discharge, or lesions. Mouth:  No deformity or lesions,oropharynx pink & moist. Neck:  Supple; no masses or thyromegaly. Lungs:  Respirations even and unlabored.  Clear throughout to auscultation.   No wheezes, crackles, or rhonchi. No acute distress. Heart:  Regular rate and rhythm; no murmurs, clicks, rubs, or gallops. Abdomen:  Normal bowel sounds. Soft, non-tender and non-distended without masses, hepatosplenomegaly or hernias noted.  No guarding or rebound tenderness.   Rectal: Not performed Msk:  Symmetrical without gross deformities. Good, equal movement & strength bilaterally. Pulses:  Normal pulses noted. Extremities:  No clubbing or edema.  No cyanosis. Neurologic:  Alert and oriented x3;  grossly normal neurologically. Skin:  Intact without significant lesions or rashes. No jaundice. Psych:  Alert and cooperative. Normal mood and affect.  Imaging Studies: Reviewed  Assessment and Plan:   Joan Johnson is a 50 y.o. female with history of hypertension is seen in consultation for globus sensation as well as to discuss about colonoscopy for colon cancer screening  Globus sensation Recommend EGD for further evaluation Continue Protonix 40 mg 1-2 times daily Discussed about antireflux lifestyle, information  provided  Schedule screening colonoscopy  I have discussed alternative options, risks & benefits,  which include, but are not limited to, bleeding, infection, perforation,respiratory complication & drug reaction.  The patient agrees with this plan & written consent will be obtained.      Follow up after above work-up   Joan Darby, MD

## 2020-09-10 NOTE — Progress Notes (Signed)
Subjective:    Patient ID: Joan Johnson, female    DOB: [birth year], 50 y.o.   MRN: [id number]  HPI The patient is a 50 year old former smoker (quit 2019, 15.5 pack years of smoking) follow-up on lung nodule.  She is kindly referred by Laverna Peace, [place].  The patient moved here from [place] in September 2021.  She was being followed at [place].  She was in a lung cancer screening program and her most recent CT was on 08 April 2020.  Unfortunately we do not have those films only the report, though the report notes the laterality of the nodule in question it does not note the lobe in question.   She is having no issues with shortness of breath, cough, sputum production or hemoptysis.  She has had no issues with weight loss or anorexia.  She will need baseline CT here but this can wait till September 2021.   She has a 15.5-pack-year history of smoking: She started at age 42 quit 2 years ago smoked half a pack of cigarettes per day.  Unsure how she qualified for lung cancer screening as she does not have at least a 20-pack-year history of smoking.  We discussed that we could enroll her in the lung nodule clinic and follow her through that venue.  States she has a history of rheumatoid arthritis but is currently not on any biologics.  Is issues with gastroesophageal reflux as well.  Both of these issues can cause groundglass opacities in the lungs.  Data: 04/08/2020 CT chest, LDCT: Report "groundglass nodular opacity seen in the right lung (lobe not specified) measuring 1.3 x 0.7 cm in size. There are a few additional small patchy subcentimeter groundglass opacities present particularly in the left lower lobe. No definitesolid nodules are seen", no image available for review, [place].  Review of Systems A 10 point review of systems was performed and it is as noted above otherwise negative.  Past Medical History:  Diagnosis  Date  . Allergy   . Arthritis   . Hypertension   . Kidney stone   . Migraine    Past Surgical History:  Procedure Laterality Date  . ANKLE SURGERY    . AUGMENTATION MAMMAPLASTY     Breast Implant  . [place]  . COSMETIC SURGERY    . HERNIA REPAIR    . OTHER SURGICAL HISTORY     tummy tuck  . OTHER SURGICAL HISTORY     carpal tunnel  . TUBAL LIGATION     Family History  Problem Relation Age of Onset  . Heart murmur Mother   . Thyroid disease Daughter    Social History   Tobacco Use  . Smoking status: Former Smoker    Quit date: 03/29/2018    Years since quitting: 2.4  . Smokeless tobacco: Never Used  Substance Use Topics  . Alcohol use: Yes    Alcohol/week: 2.0 standard drinks    Types: 2 Glasses of wine per week   Current Meds  Medication Sig  . Ascorbic Acid (VITAMIN C) 1000 MG tablet Take 1,000 mg by mouth daily.  . baclofen (LIORESAL) 10 MG tablet Take 1 tablet (10 mg total) by mouth at bedtime as needed for muscle spasms.  . fluticasone (FLONASE) 50 MCG/ACT nasal spray Place 2 sprays into both nostrils daily.  . Magnesium 500 MG TABS Take by mouth.  . meloxicam (MOBIC)  15 MG tablet Take 1 tablet (15 mg total) by mouth daily.  . pantoprazole (PROTONIX) 40 MG tablet Take 1 tablet (40 mg total) by mouth daily.  . sodium chloride (OCEAN) 0.65 % SOLN nasal spray Place 1 spray into both nostrils as needed for congestion.  . Vitamin D, Ergocalciferol, (DRISDOL) 1.25 MG (50000 UNIT) CAPS capsule Take 1 capsule (50,000 Units total) by mouth every 7 (seven) days. (taking one tablet per week) walk in lab in office 1-2 weeks after completing prescription.   Immunization History  Administered Date(s) Administered  . Influenza-Unspecified 08/03/2020  . Moderna Sars-Covid-2 Vaccination 08/12/2019, 09/09/2019       Objective:   Physical Exam BP 134/78 (BP Location: Left Arm, Cuff Size: Normal)   Pulse 88   Temp 97.7 F (36.5 C) (Temporal)    Ht 5\' 3"  (1.6 m)   Wt 192 lb (87.1 kg)   SpO2 98%   BMI 34.01 kg/m  GENERAL: Obese woman, no acute distress, no conversational dyspnea.  Fully ambulatory. HEAD: Normocephalic, atraumatic.  EYES: Pupils equal, round, reactive to light.  No scleral icterus.  MOUTH: Nose/mouth/throat not examined due to masking requirements for COVID 19. NECK: Supple. No thyromegaly. Trachea midline. No JVD.  No adenopathy. PULMONARY: Good air entry bilaterally.  No adventitious sounds. CARDIOVASCULAR: S1 and S2. Regular rate and rhythm.  No rubs, murmurs or gallops heard. ABDOMEN: Obese otherwise benign. MUSCULOSKELETAL: No joint deformity, no clubbing, no edema.  NEUROLOGIC: No focal deficit, no gait disturbance, speech is fluent. SKIN: Intact,warm,dry.  On limited exam no rashes PSYCH: Mood and behavior normal        Assessment & Plan:     ICD-10-CM   1. Lung nodule < 6cm on CT  R91.1 Ambulatory Referral for Lung Cancer Scre   Films not available Noted to be on the right lung but lobe not specified We will try to procure films  2. History of rheumatoid arthritis  Z87.39    Attempting to procure records from patient's rheumatologist She is not on maintenance medications  3. Former smoker  Z87.891    She has not had evidence of relapse Total pack years 15.5   Orders Placed This Encounter  Procedures  . Ambulatory Referral for Lung Cancer Scre    Referral Priority:   Routine    Referral Type:   Consultation    Referral Reason:   Specialty Services Required    Number of Visits Requested:   1    Discussion:  Patient has a nodule in the right lung (lobe not specified) that will need follow-up.  Unfortunately we do not have the films for review.  She had her last scan in September 2021.  She will need follow-up scan in September of this year.  She does not meet criteria for lung cancer screening as she does not have a 20-pack-year history of smoking.  We will however enroll her in the lung  nodule clinic for follow-up of this small nodule.  We are trying to obtain records from her rheumatologist's office there are some records that we were able to procure from [facility] in [place] and available through [place].  We will see the patient in a years time she is to contact us prior to that time should any new difficulties arise.  Renold Don, MD [facility]   *This note was dictated using voice recognition software/Dragon.  Despite best efforts to proofread, errors can occur which can change the meaning.  Any change was purely unintentional.

## 2020-09-10 NOTE — Progress Notes (Signed)
Had to make sure prep was sent to correct pharmacy. Resent prep

## 2020-09-11 NOTE — Telephone Encounter (Signed)
Records have been received and placed in Dr. Patsey Berthold look at folder for review.

## 2020-09-18 ENCOUNTER — Telehealth: Payer: Self-pay

## 2020-09-18 DIAGNOSIS — M0579 Rheumatoid arthritis with rheumatoid factor of multiple sites without organ or systems involvement: Secondary | ICD-10-CM

## 2020-09-18 NOTE — Telephone Encounter (Signed)
Please advise if okay to place order. KW

## 2020-09-18 NOTE — Telephone Encounter (Signed)
Since she was previously seeing ok to go ahead and place refferal to rheumatology. Folllow up with [place] if needed advised.

## 2020-09-18 NOTE — Telephone Encounter (Signed)
Copied from [place] [phone]. Topic: Referral - Request for Referral >> Sep 18, 2020  1:27 PM Alanda Slim E wrote: Has patient seen PCP for this complaint? No  *If NO, is insurance requiring patient see PCP for this issue before PCP can refer them? Referral for which specialty: Rheumatologist  Preferred provider/office:  Reason for referral: Pt was seeing one where she used to live for her arthritis/and medication/ please advise

## 2020-09-21 ENCOUNTER — Telehealth: Payer: Self-pay

## 2020-09-21 DIAGNOSIS — L659 Nonscarring hair loss, unspecified: Secondary | ICD-10-CM

## 2020-09-21 NOTE — Telephone Encounter (Signed)
Copied from [place] [id number]. Topic: Referral - Request for Referral >> Sep 18, 2020  1:27 PM Joan Johnson wrote: Has patient seen PCP for this complaint? No  *If NO, is insurance requiring patient see PCP for this issue before PCP can refer them? Referral for which specialty: Rheumatologist  Preferred provider/office:  Reason for referral: Pt was seeing one where she used to live for her arthritis/and medication/ please advise >> Sep 21, 2020  3:40 PM Joan Johnson, Joan Johnson wrote: Pt called for an update on the referral request. Pt also would like to discuss possibly getting a Rx for medication until she can get an appt with Rheumatologist office. Pt requests call back

## 2020-09-22 ENCOUNTER — Telehealth: Payer: Self-pay

## 2020-09-22 ENCOUNTER — Other Ambulatory Visit: Payer: Self-pay | Admitting: Podiatry

## 2020-09-22 ENCOUNTER — Telehealth: Payer: Self-pay | Admitting: *Deleted

## 2020-09-22 ENCOUNTER — Encounter: Payer: Self-pay | Admitting: Podiatry

## 2020-09-22 ENCOUNTER — Ambulatory Visit: Payer: 59 | Admitting: Podiatry

## 2020-09-22 ENCOUNTER — Other Ambulatory Visit: Payer: Self-pay

## 2020-09-22 ENCOUNTER — Ambulatory Visit (INDEPENDENT_AMBULATORY_CARE_PROVIDER_SITE_OTHER): Payer: 59

## 2020-09-22 DIAGNOSIS — R600 Localized edema: Secondary | ICD-10-CM | POA: Diagnosis not present

## 2020-09-22 DIAGNOSIS — M79671 Pain in right foot: Secondary | ICD-10-CM

## 2020-09-22 DIAGNOSIS — M79672 Pain in left foot: Secondary | ICD-10-CM

## 2020-09-22 DIAGNOSIS — M722 Plantar fascial fibromatosis: Secondary | ICD-10-CM

## 2020-09-22 NOTE — Telephone Encounter (Signed)
Copied from [place] [id number]. Topic: Referral - Request for Referral >> Sep 18, 2020  1:27 PM Alanda Slim E wrote: Has patient seen PCP for this complaint? No  *If NO, is insurance requiring patient see PCP for this issue before PCP can refer them? Referral for which specialty: Rheumatologist  Preferred provider/office:  Reason for referral: Pt was seeing one where she used to live for her arthritis/and medication/ please advise >> Sep 21, 2020  3:40 PM [place], Ja-Kwan wrote: Pt called for an update on the referral request. Pt also would like to discuss possibly getting a Rx for medication until she can get an appt with Rheumatologist office. Pt requests call back

## 2020-09-22 NOTE — Telephone Encounter (Signed)
Will defer that to rheumatology.

## 2020-09-22 NOTE — Telephone Encounter (Signed)
Patient has been advised she states that she does not need mobic/meloxicam filled she stated she need her arthritic medication filled called hydrochloriaquin at 200mg  sent to [facility]. KW

## 2020-09-22 NOTE — Telephone Encounter (Signed)
I am happy to refill her Mobic/ Meloxicam 15mg  po qd until she sees Rheumatology.

## 2020-09-22 NOTE — Progress Notes (Signed)
   HPI: 50 y.o. female presenting today as a new patient who recently moved from [place] to [facility] September 2021 presenting for bilateral foot pain and swelling.  Patient states that she has generalized foot pain that is aggravating to the bilateral feet.  She also states that she does have some intermittent swelling throughout the days.  The pain is not severe however it is more aggravating.  She denies a history of injury.  She has not done anything for treatment currently other than compression socks and she also purchased a compression machine to help with her circulation.  Past Medical History:  Diagnosis Date  . Allergy   . Arthritis   . Hypertension   . Kidney stone   . Migraine      Physical Exam: General: The patient is alert and oriented x3 in no acute distress.  Dermatology: Skin is warm, dry and supple bilateral lower extremities. Negative for open lesions or macerations.  Vascular: Palpable pedal pulses bilaterally. No significant appreciable edema or erythema noted. Capillary refill within normal limits.  Neurological: Epicritic and protective threshold grossly intact bilaterally.   Musculoskeletal Exam: Range of motion within normal limits to all pedal and ankle joints bilateral. Muscle strength 5/5 in all groups bilateral.  There is some generalized foot pain noted diffusely throughout the feet and ankles bilateral  Radiographic Exam:  Normal osseous mineralization. Joint spaces preserved. No fracture/dislocation/boney destruction.    Assessment: 1.  Generalized foot pain bilateral 2.  Intermittent mild edema bilateral   Plan of Care:  1. Patient evaluated. X-Rays reviewed.  2.  Compression ankle sleeves dispensed today.  Wear daily as needed 3.  Recommend good supportive shoes 4.  OTC power step arch supports were provided today.  Wear daily with good supportive sneakers 5.  Return to clinic as needed      Edrick Kins, DPM [facility]  Dr. Edrick Kins, DPM    [address], [place] [address]                Office [phone]  Fax [phone]

## 2020-09-22 NOTE — Telephone Encounter (Signed)
Contacted about lung screening scan referral. Patient reports that she had CT chest done in [place] in September of 2021. Will contact close to a year from then to scheduled lung screening at that time.

## 2020-09-24 NOTE — Telephone Encounter (Signed)
Patient advised and would like to proceed with referral. Joan Johnson

## 2020-09-24 NOTE — Telephone Encounter (Signed)
Believe she is reffering to wanting hormone testing, I advised that [facility] in [place]/ OBGYN  does salvia hormone testing if she would like to proceed with that we can place a referral. We do not do this in this office.

## 2020-09-24 NOTE — Telephone Encounter (Signed)
Patient advised and declined any other anti-inflammatory she states that she will wait till she sees Rhematologist. Patient states that she had question for you in regards to a swab test? Patient states that you referred her to dermatology but advised her that if dermatology couldn't help with hair loss that you would order a swab test? Please advise. KW

## 2020-09-29 ENCOUNTER — Telehealth: Payer: Self-pay | Admitting: Pulmonary Disease

## 2020-09-29 ENCOUNTER — Other Ambulatory Visit
Admission: RE | Admit: 2020-09-29 | Discharge: 2020-09-29 | Disposition: A | Discharge status: Home / Self Care | Payer: 59 | Source: Ambulatory Visit | Attending: Gastroenterology | Admitting: Gastroenterology

## 2020-09-29 ENCOUNTER — Other Ambulatory Visit: Payer: Self-pay

## 2020-09-29 ENCOUNTER — Telehealth: Payer: Self-pay

## 2020-09-29 DIAGNOSIS — Z01812 Encounter for preprocedural laboratory examination: Secondary | ICD-10-CM | POA: Diagnosis not present

## 2020-09-29 DIAGNOSIS — Z20822 Contact with and (suspected) exposure to covid-19: Secondary | ICD-10-CM | POA: Insufficient documentation

## 2020-09-29 NOTE — Telephone Encounter (Signed)
Copied from [place] [phone]. Topic: General - Other >> Sep 29, 2020  2:12 PM Celene Kras wrote: Pt called stating that she has not been able to set up an appt with rheumatologist due to them not having her previous medical records from [place]. She states that she is needing to have office send these over for her. Please advise.

## 2020-09-29 NOTE — Telephone Encounter (Signed)
We will have to wait on her records, I do not see in media.

## 2020-09-29 NOTE — Telephone Encounter (Signed)
Patient may need to come in office and fill out another records request and sign release as well.

## 2020-09-29 NOTE — Telephone Encounter (Signed)
Spoke to patient, who is requesting records from [place] [facility] to be faxed to PCP.  Our office received records from [place] [facility] on 09/11/2020 (please refer to 09/10/20 phone note). It does not appear that records have been scanned into patient's chart yet.  I have made patient aware of this information.  Patient is aware that PCP will be able to review records once they are scanned into her chart.  She stated that she would go to PCP and sign patient request form. She would like records ASAP, as records are needed to establish with a new rheumatologist in [place].  She did not wish to wait for records to be scanned in, as I was unable to give her a time frame.  Nothing further needed at this time.

## 2020-09-29 NOTE — Telephone Encounter (Signed)
Patient states that she was just in office and filled out a medical release form.KW

## 2020-09-30 ENCOUNTER — Encounter: Payer: Self-pay | Admitting: Gastroenterology

## 2020-09-30 LAB — SARS CORONAVIRUS 2 (TAT 6-24 HRS): SARS Coronavirus 2: NEGATIVE

## 2020-10-01 ENCOUNTER — Ambulatory Visit
Admission: RE | Admit: 2020-10-01 | Discharge: 2020-10-01 | Disposition: A | Discharge status: Home / Self Care | Payer: 59 | Attending: Gastroenterology | Admitting: Gastroenterology

## 2020-10-01 ENCOUNTER — Ambulatory Visit: Payer: 59 | Admitting: Anesthesiology

## 2020-10-01 ENCOUNTER — Encounter
Admission: RE | Disposition: A | Discharge status: Home / Self Care | Payer: Self-pay | Source: Home / Self Care | Attending: Gastroenterology

## 2020-10-01 ENCOUNTER — Encounter: Payer: Self-pay | Admitting: Gastroenterology

## 2020-10-01 DIAGNOSIS — Z8349 Family history of other endocrine, nutritional and metabolic diseases: Secondary | ICD-10-CM | POA: Diagnosis not present

## 2020-10-01 DIAGNOSIS — K644 Residual hemorrhoidal skin tags: Secondary | ICD-10-CM | POA: Insufficient documentation

## 2020-10-01 DIAGNOSIS — Z8249 Family history of ischemic heart disease and other diseases of the circulatory system: Secondary | ICD-10-CM | POA: Diagnosis not present

## 2020-10-01 DIAGNOSIS — R198 Other specified symptoms and signs involving the digestive system and abdomen: Secondary | ICD-10-CM

## 2020-10-01 DIAGNOSIS — K635 Polyp of colon: Secondary | ICD-10-CM | POA: Diagnosis not present

## 2020-10-01 DIAGNOSIS — D122 Benign neoplasm of ascending colon: Secondary | ICD-10-CM | POA: Diagnosis not present

## 2020-10-01 DIAGNOSIS — Z91013 Allergy to seafood: Secondary | ICD-10-CM | POA: Insufficient documentation

## 2020-10-01 DIAGNOSIS — Z1211 Encounter for screening for malignant neoplasm of colon: Secondary | ICD-10-CM | POA: Diagnosis present

## 2020-10-01 DIAGNOSIS — R0989 Other specified symptoms and signs involving the circulatory and respiratory systems: Secondary | ICD-10-CM

## 2020-10-01 DIAGNOSIS — Z79899 Other long term (current) drug therapy: Secondary | ICD-10-CM | POA: Insufficient documentation

## 2020-10-01 DIAGNOSIS — Z87891 Personal history of nicotine dependence: Secondary | ICD-10-CM | POA: Insufficient documentation

## 2020-10-01 HISTORY — PX: COLONOSCOPY WITH PROPOFOL: SHX5780

## 2020-10-01 HISTORY — PX: ESOPHAGOGASTRODUODENOSCOPY (EGD) WITH PROPOFOL: SHX5813

## 2020-10-01 HISTORY — DX: Personal history of urinary calculi: Z87.442

## 2020-10-01 LAB — POCT PREGNANCY, URINE: Preg Test, Ur: NEGATIVE

## 2020-10-01 SURGERY — COLONOSCOPY WITH PROPOFOL
Anesthesia: General

## 2020-10-01 MED ORDER — PROPOFOL 500 MG/50ML IV EMUL
INTRAVENOUS | Status: DC | PRN
  Administered 2020-10-01: 120 ug/kg/min via INTRAVENOUS

## 2020-10-01 MED ORDER — SODIUM CHLORIDE 0.9 % IV SOLN: INTRAVENOUS | Status: DC

## 2020-10-01 MED ORDER — PROPOFOL 500 MG/50ML IV EMUL
INTRAVENOUS | Status: AC
  Filled 2020-10-01: qty 50

## 2020-10-01 NOTE — Anesthesia Postprocedure Evaluation (Signed)
Anesthesia Post Note  Patient: Joan Johnson  Procedure(s) Performed: COLONOSCOPY WITH PROPOFOL (N/A ) ESOPHAGOGASTRODUODENOSCOPY (EGD) WITH PROPOFOL (N/A )  Patient location during evaluation: Endoscopy Anesthesia Type: General Level of consciousness: awake and alert Pain management: pain level controlled Vital Signs Assessment: post-procedure vital signs reviewed and stable Respiratory status: spontaneous breathing and respiratory function stable Cardiovascular status: stable Anesthetic complications: no   No complications documented.   Last Vitals:  Vitals:   10/01/20 1100 10/01/20 1102  BP: 109/60 109/69  Pulse:    Resp:  16  Temp: 36.6 C   SpO2:  98%    Last Pain:  Vitals:   10/01/20 1110  TempSrc:   PainSc: 0-No pain                 Gauri Galvao K

## 2020-10-01 NOTE — H&P (Signed)
Cephas Darby, MD [address]  [place]  [place], [place] [address]  Main: [phone]  Fax: [phone] Pager: [phone]  Primary Care Physician:  Doreen Beam, FNP Primary Gastroenterologist:  Dr. Cephas Darby  Pre-Procedure History & Physical: HPI:  Joan Johnson is a 50 y.o. female is here for an endoscopy and colonoscopy.   Past Medical History:  Diagnosis Date  . Allergy   . Arthritis   . History of kidney stones   . Hypertension   . Migraine     Past Surgical History:  Procedure Laterality Date  . ANKLE SURGERY    . AUGMENTATION MAMMAPLASTY     Breast Implant  . [place]  . COSMETIC SURGERY    . HERNIA REPAIR    . OTHER SURGICAL HISTORY     tummy tuck  . OTHER SURGICAL HISTORY     carpal tunnel  . TUBAL LIGATION      Prior to Admission medications   Medication Sig Start Date End Date Taking? Authorizing Provider  fluticasone (FLONASE) 50 MCG/ACT nasal spray Place 2 sprays into both nostrils daily. 09/01/20  Yes Flinchum, Kelby Aline, FNP  Vitamin D, Ergocalciferol, (DRISDOL) 1.25 MG (50000 UNIT) CAPS capsule Take 1 capsule (50,000 Units total) by mouth every 7 (seven) days. (taking one tablet per week) walk in lab in office 1-2 weeks after completing prescription. 08/04/20  Yes Flinchum, Kelby Aline, FNP  Ascorbic Acid (VITAMIN C) 1000 MG tablet Take 1,000 mg by mouth daily. Patient not taking: Reported on 10/01/2020    [provider]  Javier Docker Oil 1000 MG CAPS Take by mouth.    [provider]  Sodium Tetradecyl Sulfate 1 % SOLN Inject into the vein. Patient not taking: Reported on 10/01/2020 09/14/17   [provider]    Allergies as of 09/10/2020 - Review Complete 09/10/2020  Allergen Reaction Noted  . Shellfish allergy Hives 05/20/2020    Family History  Problem Relation Age of Onset  . Heart murmur Mother   . Thyroid disease Daughter     Social History   Socioeconomic  History  . Marital status: Married    Spouse name: Not on file  . Number of children: Not on file  . Years of education: Not on file  . Highest education level: Not on file  Occupational History  . Not on file  Tobacco Use  . Smoking status: Former Smoker    Packs/day: 0.50    Years: 21.00    Pack years: 10.50    Types: Cigarettes    Quit date: 03/29/2018    Years since quitting: 2.5  . Smokeless tobacco: Never Used  Vaping Use  . Vaping Use: Never used  Substance and Sexual Activity  . Alcohol use: Yes    Alcohol/week: 2.0 standard drinks    Types: 2 Glasses of wine per week  . Drug use: Never  . Sexual activity: Not Currently    Birth control/protection: Post-menopausal  Other Topics Concern  . Not on file  Social History Narrative   ** Merged History Encounter **       Social Determinants of Health   Financial Resource Strain: Not on file  Food Insecurity: Not on file  Transportation Needs: Not on file  Physical Activity: Not on file  Stress: Not on file  Social Connections: Not on file  Intimate Partner Violence: Not on file    Review of Systems: See HPI, otherwise  negative ROS  Physical Exam: BP (!) 127/97   Pulse 81   Temp (!) 96.6 F (35.9 C) (Temporal)   Resp 18   Ht 5\' 3"  (1.6 m)   Wt 87.1 kg   SpO2 100%   BMI 34.01 kg/m  General:   Alert,  pleasant and cooperative in NAD Head:  Normocephalic and atraumatic. Neck:  Supple; no masses or thyromegaly. Lungs:  Clear throughout to auscultation.    Heart:  Regular rate and rhythm. Abdomen:  Soft, nontender and nondistended. Normal bowel sounds, without guarding, and without rebound.   Neurologic:  Alert and  oriented x4;  grossly normal neurologically.  Impression/Plan: Joan Johnson is here for an endoscopy and colonoscopy to be performed for globus sensation and colon cancer screening  Risks, benefits, limitations, and alternatives regarding  endoscopy and colonoscopy have been reviewed with  the patient.  Questions have been answered.  All parties agreeable.   Sherri Sear, MD  10/01/2020, 9:36 AM

## 2020-10-01 NOTE — Op Note (Signed)
[facility] Patient Name: Joan Johnson Procedure Date: 10/01/2020 10:31 AM MRN: [id number] Account #: [id number] Date of Birth: 02-02-1971 Admit Type: Outpatient Age: 50 Room: [facility] ENDO ROOM 4 Gender: Female Note Status: Finalized Procedure:             Upper GI endoscopy Indications:           Globus sensation Providers:             Lin Landsman MD, MD Referring MD:          Kelby Aline. Flinchum (Referring MD) Medicines:             General Anesthesia Complications:         No immediate complications. Estimated blood loss: None. Procedure:             Pre-Anesthesia Assessment:                        - Prior to the procedure, a History and Physical was                         performed, and patient medications and allergies were                         reviewed. The patient is competent. The risks and                         benefits of the procedure and the sedation options and                         risks were discussed with the patient. All questions                         were answered and informed consent was obtained.                         Patient identification and proposed procedure were                         verified by the physician, the nurse, the                         anesthesiologist, the anesthetist and the technician                         in the pre-procedure area in the procedure room in the                         endoscopy suite. Mental Status Examination: alert and                         oriented. Airway Examination: normal oropharyngeal                         airway and neck mobility. Respiratory Examination:                         clear to auscultation. CV Examination: normal.  Prophylactic Antibiotics: The patient does not require                         prophylactic antibiotics. Prior Anticoagulants: The                         patient has taken no previous anticoagulant or                          antiplatelet agents. ASA Grade Assessment: II - A                         patient with mild systemic disease. After reviewing                         the risks and benefits, the patient was deemed in                         satisfactory condition to undergo the procedure. The                         anesthesia plan was to use general anesthesia.                         Immediately prior to administration of medications,                         the patient was re-assessed for adequacy to receive                         sedatives. The heart rate, respiratory rate, oxygen                         saturations, blood pressure, adequacy of pulmonary                         ventilation, and response to care were monitored                         throughout the procedure. The physical status of the                         patient was re-assessed after the procedure.                        After obtaining informed consent, the endoscope was                         passed under direct vision. Throughout the procedure,                         the patient's blood pressure, pulse, and oxygen                         saturations were monitored continuously. The Endoscope                         was introduced through the mouth, and advanced to the  second part of duodenum. The upper GI endoscopy was                         accomplished without difficulty. The patient tolerated                         the procedure well. Findings:      The esophagus was normal.      The stomach was normal.      The examined duodenum was normal. Impression:            - Normal esophagus.                        - Normal stomach.                        - Normal examined duodenum.                        - No specimens collected. 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 All rights reserved. The codes documented in this report are preliminary and upon coder review may  be revised to meet current compliance requirements. Dr. Ulyess Mort Lin Landsman MD, MD 10/01/2020 10:41:49 AM This report has been signed electronically. Number of Addenda: 0 Note Initiated On: 10/01/2020 10:31 AM Estimated Blood Loss:  Estimated blood loss: none.      [facility]

## 2020-10-01 NOTE — Op Note (Signed)
[facility] Gastroenterology Patient Name: Joan Johnson Procedure Date: 10/01/2020 10:30 AM MRN: [id number] Account #: [id number] Date of Birth: 03/07/1971 Admit Type: Outpatient Age: 50 Room: [facility] ENDO ROOM 4 Gender: Female Note Status: Finalized Procedure:             Colonoscopy Indications:           Screening for colorectal malignant neoplasm, This is                         the patient's first colonoscopy Providers:             Lin Landsman MD, MD Referring MD:          Kelby Aline. Flinchum (Referring MD) Medicines:             General Anesthesia Complications:         No immediate complications. Estimated blood loss: None. Procedure:             Pre-Anesthesia Assessment:                        - Prior to the procedure, a History and Physical was                         performed, and patient medications and allergies were                         reviewed. The patient is competent. The risks and                         benefits of the procedure and the sedation options and                         risks were discussed with the patient. All questions                         were answered and informed consent was obtained.                         Patient identification and proposed procedure were                         verified by the physician, the nurse, the                         anesthesiologist, the anesthetist and the technician                         in the pre-procedure area in the procedure room in the                         endoscopy suite. Mental Status Examination: alert and                         oriented. Airway Examination: normal oropharyngeal                         airway and neck mobility. Respiratory Examination:  clear to auscultation. CV Examination: normal.                         Prophylactic Antibiotics: The patient does not require                         prophylactic antibiotics. Prior  Anticoagulants: The                         patient has taken no previous anticoagulant or                         antiplatelet agents. ASA Grade Assessment: II - A                         patient with mild systemic disease. After reviewing                         the risks and benefits, the patient was deemed in                         satisfactory condition to undergo the procedure. The                         anesthesia plan was to use general anesthesia.                         Immediately prior to administration of medications,                         the patient was re-assessed for adequacy to receive                         sedatives. The heart rate, respiratory rate, oxygen                         saturations, blood pressure, adequacy of pulmonary                         ventilation, and response to care were monitored                         throughout the procedure. The physical status of the                         patient was re-assessed after the procedure.                        After obtaining informed consent, the colonoscope was                         passed under direct vision. Throughout the procedure,                         the patient's blood pressure, pulse, and oxygen                         saturations were monitored continuously. The  Colonoscope was introduced through the anus and                         advanced to the the cecum, identified by appendiceal                         orifice and ileocecal valve. The colonoscopy was                         performed without difficulty. The patient tolerated                         the procedure well. The quality of the bowel                         preparation was evaluated using the BBPS [facility] Bowel                         Preparation Scale) with scores of: Right Colon = 3,                         Transverse Colon = 3 and Left Colon = 3 (entire mucosa                         seen well with no  residual staining, small fragments                         of stool or opaque liquid). The total BBPS score                         equals 9. Findings:      The perianal exam findings include skin tags.      Two sessile polyps were found in the recto-sigmoid colon and ascending       colon. The polyps were diminutive in size. These polyps were removed       with a cold biopsy forceps. Resection and retrieval were complete.      The retroflexed view of the distal rectum and anal verge was normal and       showed no anal or rectal abnormalities.      The exam was otherwise without abnormality. Impression:            - Perianal skin tags found on perianal exam.                        - Two diminutive polyps at the recto-sigmoid colon and                         in the ascending colon, removed with a cold biopsy                         forceps. Resected and retrieved.                        - The distal rectum and anal verge are normal on                         retroflexion view.                        -  The examination was otherwise normal. Recommendation:        - Discharge patient to home (with escort).                        - Resume previous diet today.                        - Continue present medications.                        - Await pathology results.                        - Repeat colonoscopy in 7-10 years for surveillance                         based on pathology results. Procedure Code(s):     --- Professional ---                        [id number], Colonoscopy, flexible; with biopsy, single or                         multiple Diagnosis Code(s):     --- Professional ---                        Z12.11, Encounter for screening for malignant neoplasm                         of colon                        K63.5, Polyp of colon                        K64.4, Residual hemorrhoidal skin tags CPT copyright 2019 American Medical Association. All rights reserved. The codes documented in  this report are preliminary and upon coder review may  be revised to meet current compliance requirements. Dr. Ulyess Mort Lin Landsman MD, MD 10/01/2020 11:00:17 AM This report has been signed electronically. Number of Addenda: 0 Note Initiated On: 10/01/2020 10:30 AM Scope Withdrawal Time: 0 hours 10 minutes 19 seconds  Total Procedure Duration: 0 hours 12 minutes 45 seconds  Estimated Blood Loss:  Estimated blood loss: none.      [facility]

## 2020-10-01 NOTE — Transfer of Care (Signed)
Immediate Anesthesia Transfer of Care Note  Patient: Joan Johnson  Procedure(s) Performed: COLONOSCOPY WITH PROPOFOL (N/A ) ESOPHAGOGASTRODUODENOSCOPY (EGD) WITH PROPOFOL (N/A )  Patient Location: PACU and Endoscopy Unit  Anesthesia Type:General  Level of Consciousness: drowsy and patient cooperative  Airway & Oxygen Therapy: Patient Spontanous Breathing and Patient connected to face mask oxygen  Post-op Assessment: Report given to RN and Post -op Vital signs reviewed and stable  Post vital signs: Reviewed and stable  Last Vitals:  Vitals Value Taken Time  BP 109/69 10/01/20 1102  Temp 36.6 C 10/01/20 1100  Pulse 90 10/01/20 1102  Resp 19 10/01/20 1102  SpO2 99 % 10/01/20 1102  Vitals shown include unvalidated device data.  Last Pain:  Vitals:   10/01/20 1100  TempSrc: Temporal  PainSc: Asleep         Complications: No complications documented.

## 2020-10-01 NOTE — Anesthesia Procedure Notes (Signed)
Procedure Name: General with mask airway Performed by: Vaughan Sine Pre-anesthesia Checklist: Patient identified, Emergency Drugs available, Suction available, Patient being monitored and Timeout performed Patient Re-evaluated:Patient Re-evaluated prior to induction Oxygen Delivery Method: Simple face mask Preoxygenation: Pre-oxygenation with 100% oxygen Induction Type: IV induction Airway Equipment and Method: Bite block Placement Confirmation: CO2 detector and positive ETCO2

## 2020-10-01 NOTE — Anesthesia Preprocedure Evaluation (Signed)
Anesthesia Evaluation  Patient identified by MRN, date of birth, ID band Patient awake    Reviewed: Allergy & Precautions, NPO status , Patient's Chart, lab work & pertinent test results  History of Anesthesia Complications Negative for: history of anesthetic complications  Airway Mallampati: III       Dental  (+)  Implant marked below. Not loose but only cemented with temporary cement:   Pulmonary neg sleep apnea, neg COPD, Not current smoker, former smoker,           Cardiovascular hypertension (off meds now), (-) Past MI and (-) CHF (-) dysrhythmias (-) Valvular Problems/Murmurs     Neuro/Psych neg Seizures    GI/Hepatic Neg liver ROS, GERD (off meds now)  ,  Endo/Other  neg diabetes  Renal/GU Renal disease (stones)     Musculoskeletal   Abdominal   Peds  Hematology   Anesthesia Other Findings   Reproductive/Obstetrics                             Anesthesia Physical Anesthesia Plan  ASA: II  Anesthesia Plan: General   Post-op Pain Management:    Induction: Intravenous  PONV Risk Score and Plan: 3 and Propofol infusion and TIVA  Airway Management Planned: Nasal Cannula  Additional Equipment:   Intra-op Plan:   Post-operative Plan:   Informed Consent: I have reviewed the patients History and Physical, chart, labs and discussed the procedure including the risks, benefits and alternatives for the proposed anesthesia with the patient or authorized representative who has indicated his/her understanding and acceptance.       Plan Discussed with:   Anesthesia Plan Comments:         Anesthesia Quick Evaluation

## 2020-10-02 ENCOUNTER — Encounter: Payer: Self-pay | Admitting: Gastroenterology

## 2020-10-02 LAB — SURGICAL PATHOLOGY

## 2020-10-05 NOTE — Telephone Encounter (Signed)
We can not send medical records that were sent to [place] from another office. We can only send our records. Pt would need to contact office in [place] to have her records sent to the office she is requesting them to be sent. TNP

## 2020-10-05 NOTE — Progress Notes (Signed)
Colonoscopy just please update chart to reflect repeat to 7 years patient should have gotten the letter from gastroenterology.   The polyp removed from your colon was benign, but precancerous. This means that it had the potential to change into cancer over time had it not been removed. I recommend you have a repeat colonoscopy in 7 years

## 2020-10-06 ENCOUNTER — Telehealth: Payer: Self-pay

## 2020-10-06 NOTE — Telephone Encounter (Signed)
Medical records doesn't have anything waiting to be scanned for this pt. TNP

## 2020-10-06 NOTE — Telephone Encounter (Signed)
Noted I do not recall seeing them and do  not see in media.

## 2020-10-06 NOTE — Telephone Encounter (Signed)
Please call patient to see what this in regards to, I think likely the medical records we are waiting on, she can come in and fill out another request for records if we do not have them. We can try to request again.

## 2020-10-06 NOTE — Telephone Encounter (Signed)
Provider also has not reviewed and and have none on my desk, we will need to request again urgently for patient, please let her know if she needs to come in and fill out new release.

## 2020-10-06 NOTE — Telephone Encounter (Signed)
Copied from [place] [phone]. Topic: General - Other >> Oct 06, 2020 10:51 AM Pawlus, Brayton Layman A wrote: Reason for CRM: Pt requested a call back from [place] or her nurse.

## 2020-10-06 NOTE — Telephone Encounter (Signed)
I advised patient as below she became very upset on phone. Patient states that she has came to office twice to have to sign for medical records release, she states that she has contacted [place] multiple times and specialist reassured her it had been faxed to our office. Patient states that pulmonology had received her records and stated that she was waiting for it to be scanned. Patient is stating that she cannnot call her doctor in [place] to give a verbal approval to release records. Patient is wanting [place] to go back to make sure we havent received documents and its just waiting to be scanned. KW

## 2020-10-07 ENCOUNTER — Ambulatory Visit: Payer: 59 | Admitting: Physician Assistant

## 2020-10-07 NOTE — Telephone Encounter (Signed)
This is in regards to medical records for rhematology, please see previous telephone encounter, patient states that she has filled out medical record release twice from our office, the most recent was two weeks ago. KW

## 2020-10-07 NOTE — Telephone Encounter (Signed)
Thanks and medical records  has seen nothing and they heave nothing either. She may should call her old office and be sure they received and send to [place] asap.

## 2020-10-08 ENCOUNTER — Ambulatory Visit: Payer: 59 | Admitting: Physician Assistant

## 2020-10-08 ENCOUNTER — Other Ambulatory Visit: Payer: Self-pay

## 2020-10-08 ENCOUNTER — Encounter: Payer: Self-pay | Admitting: Physician Assistant

## 2020-10-08 VITALS — BP 151/97 | HR 105 | Temp 98.4°F | Wt 189.0 lb

## 2020-10-08 DIAGNOSIS — F418 Other specified anxiety disorders: Secondary | ICD-10-CM

## 2020-10-08 DIAGNOSIS — I10 Essential (primary) hypertension: Secondary | ICD-10-CM | POA: Diagnosis not present

## 2020-10-08 MED ORDER — ALPRAZOLAM 0.25 MG PO TABS: 0.2500 mg | ORAL_TABLET | Freq: Two times a day (BID) | ORAL | 0 refills | Status: DC | PRN

## 2020-10-08 MED ORDER — LOSARTAN POTASSIUM 25 MG PO TABS: 25.0000 mg | ORAL_TABLET | Freq: Every day | ORAL | 1 refills | Status: DC

## 2020-10-08 NOTE — Progress Notes (Signed)
Established patient visit   Patient: Joan Johnson   DOB: [birth year]   50 y.o. Female  MRN: [id number] Visit Date: 10/08/2020  Today's healthcare provider: Mar Daring, PA-C   Chief Complaint  Patient presents with  . Hypertension   Subjective    HPI  Hypertension, follow-up  BP Readings from Last 3 Encounters:  10/08/20 (!) 151/97  10/01/20 137/85  09/10/20 (!) 139/94   Wt Readings from Last 3 Encounters:  10/08/20 189 lb (85.7 kg)  10/01/20 192 lb (87.1 kg)  09/10/20 182 lb (82.6 kg)     She was last seen for hypertension 1 months ago.  BP at that visit was 139/94. Management since that visit includes pt wanting to stop HCTZ. Feels it may be contributing to ankle swelling and to sores in mouth. Advised she could try beet root to see if it may help.  She is not having side effects.  She is following a Low Sodium diet. She is exercising. She does not smoke.  Use of agents associated with hypertension: none.   Outside blood pressures have been elevated at the last two doctors appointments. Symptoms: No chest pain No chest pressure  No palpitations No syncope  No dyspnea No orthopnea  No paroxysmal nocturnal dyspnea No lower extremity edema   Pertinent labs: Lab Results  Component Value Date   CHOL 218 (H) 10/08/2020   HDL 48 10/08/2020   LDLCALC 149 (H) 10/08/2020   TRIG 119 10/08/2020   CHOLHDL 4.5 (H) 10/08/2020   Lab Results  Component Value Date   NA 145 (H) 10/08/2020   K 4.6 10/08/2020   CREATININE 0.74 10/08/2020   GFRNONAA 78 07/30/2020   GFRAA 90 07/30/2020   GLUCOSE 98 10/08/2020     The 10-year ASCVD risk score Mikey Bussing DC Jr., et al., 2013) is: 3.1%   ---------------------------------------------------------------------------------------------------   Patient Active Problem List   Diagnosis Date Noted  . Globus sensation 09/10/2020  . Allergic rhinitis 09/01/2020  . Hair loss 09/01/2020  . Hypertension 09/01/2020  . Hx  of migraines 07/29/2020  . Gastroesophageal reflux disease 07/29/2020  . Dysphagia 07/29/2020  . Colon cancer screening 07/29/2020  . Vitamin D deficiency 07/29/2020  . Lung nodule 07/29/2020  . Vision changes 07/29/2020  . Fatigue 07/29/2020  . Spasm of cervical paraspinous muscle 07/29/2020  . Rheumatoid arthritis with positive rheumatoid factor ([place]) 10/17/2017  . Class 1 obesity without serious comorbidity in adult 09/19/2017   Past Medical History:  Diagnosis Date  . Allergy   . Arthritis   . History of kidney stones   . Hypertension   . Migraine    Social History   Tobacco Use  . Smoking status: Former Smoker    Packs/day: 0.50    Years: 21.00    Pack years: 10.50    Types: Cigarettes    Quit date: 03/29/2018    Years since quitting: 2.5  . Smokeless tobacco: Never Used  Vaping Use  . Vaping Use: Never used  Substance Use Topics  . Alcohol use: Yes    Alcohol/week: 2.0 standard drinks    Types: 2 Glasses of wine per week  . Drug use: Never   Allergies  Allergen Reactions  . Shellfish Allergy Hives     Medications: Outpatient Medications Prior to Visit  Medication Sig  . fluticasone (FLONASE) 50 MCG/ACT nasal spray Place 2 sprays into both nostrils daily.  Javier Docker Oil 1000 MG CAPS Take by mouth.  Marland Kitchen  loratadine (CLARITIN) 10 MG tablet Take 10 mg by mouth daily.  . Vitamin D, Ergocalciferol, (DRISDOL) 1.25 MG (50000 UNIT) CAPS capsule Take 1 capsule (50,000 Units total) by mouth every 7 (seven) days. (taking one tablet per week) walk in lab in office 1-2 weeks after completing prescription.   No facility-administered medications prior to visit.    Review of Systems  Constitutional: Negative.   Respiratory: Negative.   Cardiovascular: Positive for leg swelling (Pt reports this is improving ). Negative for chest pain and palpitations.  Gastrointestinal: Negative.   Genitourinary: Negative.   Allergic/Immunologic: Positive for immunocompromised state.   Neurological: Positive for headaches (Secondary to a scalp biopsy). Negative for dizziness and light-headedness.       Objective    BP (!) 151/97 (BP Location: Right Arm, Patient Position: Sitting, Cuff Size: Large)   Pulse (!) 105   Temp 98.4 F (36.9 C) (Oral)   Wt 189 lb (85.7 kg)   BMI 33.48 kg/m     Physical Exam Vitals reviewed.  Constitutional:      General: She is not in acute distress.    Appearance: Normal appearance. She is well-developed. She is not ill-appearing.  HENT:     Head: Normocephalic and atraumatic.  Pulmonary:     Effort: Pulmonary effort is normal. No respiratory distress.  Musculoskeletal:     Cervical back: Normal range of motion and neck supple.  Neurological:     Mental Status: She is alert.  Psychiatric:        Behavior: Behavior normal.        Thought Content: Thought content normal.        Judgment: Judgment normal.     No results found for any visits on 10/08/20.  Assessment & Plan     1. Primary hypertension BP elevated. Will start losartan as below. F/U in 4 weeks.  - losartan (COZAAR) 25 MG tablet; Take 1 tablet (25 mg total) by mouth daily.  Dispense: 90 tablet; Refill: 1  2. Situational anxiety Given for flying. - ALPRAZolam (XANAX) 0.25 MG tablet; Take 1 tablet (0.25 mg total) by mouth 2 (two) times daily as needed for anxiety.  Dispense: 4 tablet; Refill: 0   Return in about 4 weeks (around 11/05/2020) for htn.      Reynolds Bowl, PA-C, have reviewed all documentation for this visit. The documentation on 10/18/20 for the exam, diagnosis, procedures, and orders are all accurate and complete.   Rubye Beach  [facility] [phone] (phone) [phone] (fax)  [place]

## 2020-10-08 NOTE — Patient Instructions (Signed)
Losartan Tablets What is this medicine? LOSARTAN (loe SAR tan) is an angiotensin II receptor blocker, also known as an ARB. It treats high blood pressure. It can slow kidney damage in some patients. It may also be used to lower the risk of stroke. This medicine may be used for other purposes; ask your health care provider or pharmacist if you have questions. COMMON BRAND NAME(S): Cozaar What should I tell my health care provider before I take this medicine? They need to know if you have any of these conditions:  heart failure  kidney disease  liver disease  an unusual or allergic reaction to losartan, other medicines, foods, dyes, or preservatives  pregnant or trying to get pregnant  breast-feeding How should I use this medicine? Take this medicine by mouth. Take it as directed on the prescription label at the same time every day. You can take it with or without food. If it upsets your stomach, take it with food. Keep taking it unless your health care provider tells you to stop. Talk to your health care provider about the use of this medicine in children. While it may be prescribed for children as young as 6 for selected conditions, precautions do apply. Overdosage: If you think you have taken too much of this medicine contact a poison control center or emergency room at once. NOTE: This medicine is only for you. Do not share this medicine with others. What if I miss a dose? If you miss a dose, take it as soon as you can. If it is almost time for your next dose, take only that dose. Do not take double or extra doses. What may interact with this medicine?  aliskiren  ACE inhibitors, like enalapril or lisinopril  diuretics, especially amiloride, eplerenone, spironolactone, or triamterene  lithium  NSAIDs, medicines for pain and inflammation, like ibuprofen or naproxen  potassium salts or potassium supplements This list may not describe all possible interactions. Give your health  care provider a list of all the medicines, herbs, non-prescription drugs, or dietary supplements you use. Also tell them if you smoke, drink alcohol, or use illegal drugs. Some items may interact with your medicine. What should I watch for while using this medicine? Visit your health care provider for regular check ups. Check your blood pressure as directed. Ask your health care provider what your blood pressure should be. Also, find out when you should contact him or her. Do not treat yourself for coughs, colds, or pain while you are using this medicine without asking your health care provider for advice. Some medicines may increase your blood pressure. Women should inform their health care provider if they wish to become pregnant or think they might be pregnant. There is a potential for serious side effects to an unborn child. Talk to your health care provider for more information. You may get drowsy or dizzy. Do not drive, use machinery, or do anything that needs mental alertness until you know how this medicine affects you. Do not stand or sit up quickly, especially if you are an older patient. This reduces the risk of dizzy or fainting spells. Alcohol can make you more drowsy and dizzy. Avoid alcoholic drinks. Avoid salt substitutes unless you are told otherwise by your health care provider. What side effects may I notice from receiving this medicine? Side effects that you should report to your doctor or health care professional as soon as possible:  allergic reactions (skin rash, itching or hives, swelling of the hands, feet,  face, lips, throat, or tongue)  breathing problems  high potassium levels (chest pain; or fast, irregular heartbeat; muscle weakness)  kidney injury (trouble passing urine or change in the amount of urine)  low blood pressure (dizziness; feeling faint or lightheaded, falls; unusually weak or tired) Side effects that usually do not require medical attention (report to  your doctor or health care professional if they continue or are bothersome):  cough  headache  nasal congestion or stuffiness  nausea or stomach pain This list may not describe all possible side effects. Call your doctor for medical advice about side effects. You may report side effects to FDA at 1-800-FDA-1088. Where should I keep my medicine? Keep out of the reach of children and pets. Store at room temperature between 20 and 25 degrees C (68 and 77 degrees F). Protect from light. Keep the container tightly closed. Get rid of any unused medicine after the expiration date. To get rid of medicines that are no longer needed or have expired:  Take the medicine to a medicine take-back program. Check with your pharmacy or law enforcement to find a location.  If you cannot return the medicine, check the label or package insert to see if the medicine should be thrown out in the garbage or flushed down the toilet. If you are not sure, ask your health care provider. If it is safe to put in the trash, empty the medicine out of the container. Mix the medicine with cat litter, dirt, coffee grounds, or other unwanted substance. Seal the mixture in a bag or container. Put it in the trash. NOTE: This sheet is a summary. It may not cover all possible information. If you have questions about this medicine, talk to your doctor, pharmacist, or health care provider.  2021 Elsevier/Gold Standard (2019-09-26 16:16:09)

## 2020-10-09 ENCOUNTER — Telehealth: Payer: Self-pay | Admitting: Adult Health

## 2020-10-09 LAB — COMPREHENSIVE METABOLIC PANEL
ALT: 27 IU/L (ref 0–32)
AST: 18 IU/L (ref 0–40)
Albumin/Globulin Ratio: 1.8 (ref 1.2–2.2)
Albumin: 4.7 g/dL (ref 3.8–4.8)
Alkaline Phosphatase: 85 IU/L (ref 44–121)
BUN/Creatinine Ratio: 9 (ref 9–23)
BUN: 7 mg/dL (ref 6–24)
Bilirubin Total: 1.1 mg/dL (ref 0.0–1.2)
CO2: 23 mmol/L (ref 20–29)
Calcium: 10 mg/dL (ref 8.7–10.2)
Chloride: 106 mmol/L (ref 96–106)
Creatinine, Ser: 0.74 mg/dL (ref 0.57–1.00)
Globulin, Total: 2.6 g/dL (ref 1.5–4.5)
Glucose: 98 mg/dL (ref 65–99)
Potassium: 4.6 mmol/L (ref 3.5–5.2)
Sodium: 145 mmol/L — ABNORMAL HIGH (ref 134–144)
Total Protein: 7.3 g/dL (ref 6.0–8.5)
eGFR: 99 mL/min/{1.73_m2} (ref >59)

## 2020-10-09 LAB — CBC WITH DIFFERENTIAL
Basophils Absolute: 0.1 10*3/uL (ref 0.0–0.2)
Basos: 1 %
EOS (ABSOLUTE): 0.3 10*3/uL (ref 0.0–0.4)
Eos: 4 %
Hematocrit: 43 % (ref 34.0–46.6)
Hemoglobin: 14.2 g/dL (ref 11.1–15.9)
Immature Grans (Abs): 0 10*3/uL (ref 0.0–0.1)
Immature Granulocytes: 0 %
Lymphocytes Absolute: 1.3 10*3/uL (ref 0.7–3.1)
Lymphs: 20 %
MCH: 30.3 pg (ref 26.6–33.0)
MCHC: 33 g/dL (ref 31.5–35.7)
MCV: 92 fL (ref 79–97)
Monocytes Absolute: 0.5 10*3/uL (ref 0.1–0.9)
Monocytes: 8 %
Neutrophils Absolute: 4.3 10*3/uL (ref 1.4–7.0)
Neutrophils: 67 %
RBC: 4.69 x10E6/uL (ref 3.77–5.28)
RDW: 12.8 % (ref 11.7–15.4)
WBC: 6.5 10*3/uL (ref 3.4–10.8)

## 2020-10-09 LAB — VITAMIN D 25 HYDROXY (VIT D DEFICIENCY, FRACTURES): Vit D, 25-Hydroxy: 36.1 ng/mL (ref 30.0–100.0)

## 2020-10-09 NOTE — Telephone Encounter (Signed)
Lipid panel was not ordered, I can still add it since she had labs drawn yesterday. Okay to send request to [facility]? KW

## 2020-10-09 NOTE — Telephone Encounter (Signed)
Yes ok to add lipid panel. Thanks.

## 2020-10-09 NOTE — Telephone Encounter (Signed)
Patient would like the nurse to call her regarding her recent blood test results.  She stated she did not see her cholesterol results on the My Chart and would like the nurse to show her how she can get that.  Please advise and call to discuss at [phone]

## 2020-10-09 NOTE — Progress Notes (Signed)
CBC, CMP and Vitamin D within normal limits.

## 2020-10-09 NOTE — Telephone Encounter (Signed)
Spoke with patient and advised her that I will have lab added to what was drawn. KW

## 2020-10-11 LAB — LIPID PANEL
Chol/HDL Ratio: 4.5 ratio — ABNORMAL HIGH (ref 0.0–4.4)
Cholesterol, Total: 218 mg/dL — ABNORMAL HIGH (ref 100–199)
HDL: 48 mg/dL (ref >39)
LDL Chol Calc (NIH): 149 mg/dL — ABNORMAL HIGH (ref 0–99)
Triglycerides: 119 mg/dL (ref 0–149)
VLDL Cholesterol Cal: 21 mg/dL (ref 5–40)

## 2020-10-11 LAB — SPECIMEN STATUS REPORT

## 2020-10-11 NOTE — Progress Notes (Signed)
Total cholesterol and LDL elevated.  Discuss lifestyle modification with patient e.g. increase exercise, fiber, fruits, vegetables, lean meat, and omega 3/fish intake and decrease saturated fat.  If patient following strict diet and exercise program already please schedule follow up appointment with primary care physician   Cholesterol is higher than last check was she fasting for 8 hours ? Wok hard on diet and recheck in 6 months may benefit from statin- a cholesterol  lowering medication to prebent cardiovascular risk factors. If she was fasting and wants to discus medication , lets schedule follow up virtual or in person.

## 2020-10-18 ENCOUNTER — Encounter: Payer: Self-pay | Admitting: Physician Assistant

## 2020-10-21 ENCOUNTER — Telehealth: Payer: Self-pay

## 2020-10-21 ENCOUNTER — Encounter: Payer: Self-pay | Admitting: Adult Health

## 2020-10-21 NOTE — Telephone Encounter (Signed)
Being that she is having headaches and has had recent medication changes would be best for her to be evaluates while in [place] as not comfortable sending knowing she is not checking pressure. Elevated blood pressure could be the cause of her headaches and she should be evaluated there for this- this week is advised.

## 2020-10-21 NOTE — Telephone Encounter (Signed)
Patient states that she will be in [place] till June, she states that she will check blood pressure and find a doctor to follow up for her blood pressure if it remains high. KW

## 2020-10-21 NOTE — Telephone Encounter (Signed)
Advised patient as below, she states that she is back in [place] and will be there for the next few months, patient states that she has an appt up there to see rheumatologist next week. Patient states that she does not check her blood pressure because she believes fear/anxiety of knowing blood pressure is elevated will cause her blood pressure reading to be elevated when taken. Patient would like for you to send in new prescription for HCTZ to [facility] in [place] ( store phone number [phone]). KW

## 2020-10-21 NOTE — Telephone Encounter (Signed)
Noted sent patient Mychart message.

## 2020-10-21 NOTE — Telephone Encounter (Signed)
Called and spoke with patient on the phone who reports that she would like to switch back to her previous blood pressure medication. Patient was started on Losartan 25mg  on 10/08/20 patient states since starting medication she has been waking up with headaches, she states that she feels that previous medication worked better for her. Please advise. KW

## 2020-10-21 NOTE — Telephone Encounter (Signed)
See my message below. KW

## 2020-10-21 NOTE — Telephone Encounter (Signed)
Is she referring to the hydrochlorothiazide 25mg  po qd she was previously on ? She saw [facility] and was given losartan as her blood pressure was still high.  Is she checking her blood pressure at home ?  Ok to switch back but would advise follow up in office in 2- 3 weeks to recheck blood pressure and sooner if any of her symptoms worsening at anytime seek care.

## 2020-10-21 NOTE — Telephone Encounter (Signed)
Copied from [place] [phone]. Topic: General - Other >> Oct 21, 2020  1:40 PM Pawlus, Monica A wrote: Reason for CRM: Pt wanted a call back from [place] or her nurse regarding her medication, is not happy on the losartan (COZAAR) 25 MG tablet and wanted to know if she could be on something else.

## 2020-11-16 ENCOUNTER — Encounter: Payer: Self-pay | Admitting: Pulmonary Disease

## 2020-12-30 ENCOUNTER — Ambulatory Visit: Payer: Self-pay | Admitting: Adult Health

## 2021-04-17 ENCOUNTER — Emergency Department: Payer: 59

## 2021-04-17 ENCOUNTER — Emergency Department
Admission: EM | Admit: 2021-04-17 | Discharge: 2021-04-17 | Disposition: A | Discharge status: Home / Self Care | Payer: 59 | Attending: Emergency Medicine | Admitting: Emergency Medicine

## 2021-04-17 ENCOUNTER — Encounter: Payer: Self-pay | Admitting: Emergency Medicine

## 2021-04-17 ENCOUNTER — Other Ambulatory Visit: Payer: Self-pay

## 2021-04-17 DIAGNOSIS — N201 Calculus of ureter: Secondary | ICD-10-CM

## 2021-04-17 DIAGNOSIS — Z87891 Personal history of nicotine dependence: Secondary | ICD-10-CM | POA: Diagnosis not present

## 2021-04-17 DIAGNOSIS — K219 Gastro-esophageal reflux disease without esophagitis: Secondary | ICD-10-CM | POA: Insufficient documentation

## 2021-04-17 DIAGNOSIS — I10 Essential (primary) hypertension: Secondary | ICD-10-CM | POA: Insufficient documentation

## 2021-04-17 DIAGNOSIS — N200 Calculus of kidney: Secondary | ICD-10-CM

## 2021-04-17 DIAGNOSIS — N132 Hydronephrosis with renal and ureteral calculous obstruction: Secondary | ICD-10-CM | POA: Insufficient documentation

## 2021-04-17 DIAGNOSIS — R109 Unspecified abdominal pain: Secondary | ICD-10-CM | POA: Diagnosis present

## 2021-04-17 DIAGNOSIS — Z79899 Other long term (current) drug therapy: Secondary | ICD-10-CM | POA: Diagnosis not present

## 2021-04-17 LAB — CBC
HCT: 40 % (ref 36.0–46.0)
Hemoglobin: 13.8 g/dL (ref 12.0–15.0)
MCH: 31.7 pg (ref 26.0–34.0)
MCHC: 34.5 g/dL (ref 30.0–36.0)
MCV: 92 fL (ref 80.0–100.0)
Platelets: 271 10*3/uL (ref 150–400)
RBC: 4.35 MIL/uL (ref 3.87–5.11)
RDW: 12.9 % (ref 11.5–15.5)
WBC: 6.8 10*3/uL (ref 4.0–10.5)
nRBC: 0 % (ref 0.0–0.2)

## 2021-04-17 LAB — BASIC METABOLIC PANEL
Anion gap: 11 (ref 5–15)
BUN: 16 mg/dL (ref 6–20)
CO2: 25 mmol/L (ref 22–32)
Calcium: 9.4 mg/dL (ref 8.9–10.3)
Chloride: 102 mmol/L (ref 98–111)
Creatinine, Ser: 0.83 mg/dL (ref 0.44–1.00)
GFR, Estimated: >60 mL/min (ref >60)
Glucose, Bld: 109 mg/dL — ABNORMAL HIGH (ref 70–99)
Potassium: 3.6 mmol/L (ref 3.5–5.1)
Sodium: 138 mmol/L (ref 135–145)

## 2021-04-17 LAB — URINALYSIS, COMPLETE (UACMP) WITH MICROSCOPIC
Glucose, UA: NEGATIVE mg/dL
Leukocytes,Ua: NEGATIVE
Nitrite: NEGATIVE
Protein, ur: 30 mg/dL — AB
RBC / HPF: >50 RBC/hpf — ABNORMAL HIGH (ref 0–5)
Specific Gravity, Urine: >1.03 — ABNORMAL HIGH (ref 1.005–1.030)
pH: 5.5 (ref 5.0–8.0)

## 2021-04-17 MED ORDER — HYDROMORPHONE HCL 1 MG/ML IJ SOLN
0.5000 mg | Freq: Once | INTRAMUSCULAR | Status: AC
  Administered 2021-04-17: 0.5 mg via INTRAVENOUS
  Filled 2021-04-17: qty 1

## 2021-04-17 MED ORDER — LACTATED RINGERS IV BOLUS
1000.0000 mL | Freq: Once | INTRAVENOUS | Status: AC
  Administered 2021-04-17: 1000 mL via INTRAVENOUS

## 2021-04-17 MED ORDER — OXYCODONE-ACETAMINOPHEN 5-325 MG PO TABS
1.0000 | ORAL_TABLET | ORAL | Status: DC | PRN
  Administered 2021-04-17: 1 via ORAL
  Filled 2021-04-17: qty 1

## 2021-04-17 MED ORDER — ONDANSETRON HCL 4 MG/2ML IJ SOLN
4.0000 mg | Freq: Once | INTRAMUSCULAR | Status: AC
  Administered 2021-04-17: 4 mg via INTRAVENOUS
  Filled 2021-04-17: qty 2

## 2021-04-17 MED ORDER — KETOROLAC TROMETHAMINE 30 MG/ML IJ SOLN
15.0000 mg | Freq: Once | INTRAMUSCULAR | Status: AC
  Administered 2021-04-17: 15 mg via INTRAVENOUS
  Filled 2021-04-17: qty 1

## 2021-04-17 MED ORDER — ONDANSETRON 4 MG PO TBDP: 4.0000 mg | ORAL_TABLET | Freq: Three times a day (TID) | ORAL | 0 refills | Status: DC | PRN

## 2021-04-17 MED ORDER — ONDANSETRON 4 MG PO TBDP
4.0000 mg | ORAL_TABLET | Freq: Once | ORAL | Status: AC
  Administered 2021-04-17: 4 mg via ORAL
  Filled 2021-04-17: qty 1

## 2021-04-17 NOTE — ED Provider Notes (Signed)
[facility] Emergency Department Provider Note ____________________________________________   Event Date/Time   First MD Initiated Contact with Patient 04/17/21 1548     (approximate)  I have reviewed the triage vital signs and the nursing notes.  HISTORY  Chief Complaint Flank Pain   HPI Joan Johnson is a 50 y.o. femalewho presents to the ED for evaluation of flank pain.   Chart review indicates history of obesity and nephrolithiasis.  Patient presents to the ED for evaluation of 1 day of severe left-sided flank pain, nausea and vomiting.  Reports 10/10 intensity, sharp left flank pain that started this morning when she woke up.  Reports associated nausea and countless episodes of nonbloody nonbilious emesis.  Pain radiating towards her groin on the left side.  Denies fevers, dysuria, syncopal episodes, frontal chest pain.  Denies falls or injury.  Reports that she felt normal yesterday.    Past Medical History:  Diagnosis Date   Allergy    Arthritis    History of kidney stones    Hypertension    Migraine     Patient Active Problem List   Diagnosis Date Noted   Globus sensation 09/10/2020   Allergic rhinitis 09/01/2020   Hair loss 09/01/2020   Hypertension 09/01/2020   Hx of migraines 07/29/2020   Gastroesophageal reflux disease 07/29/2020   Dysphagia 07/29/2020   Colon cancer screening 07/29/2020   Vitamin D deficiency 07/29/2020   Lung nodule 07/29/2020   Vision changes 07/29/2020   Fatigue 07/29/2020   Spasm of cervical paraspinous muscle 07/29/2020   Rheumatoid arthritis with positive rheumatoid factor ([place]) 10/17/2017   Class 1 obesity without serious comorbidity in adult 09/19/2017    Past Surgical History:  Procedure Laterality Date   [place]   COLONOSCOPY WITH PROPOFOL N/A 10/01/2020   Procedure: COLONOSCOPY WITH PROPOFOL;   Surgeon: Lin Landsman, MD;  Location[facility] ENDOSCOPY;  Service: Gastroenterology;  Laterality: N/A;   COSMETIC SURGERY     ESOPHAGOGASTRODUODENOSCOPY (EGD) WITH PROPOFOL N/A 10/01/2020   Procedure: ESOPHAGOGASTRODUODENOSCOPY (EGD) WITH PROPOFOL;  Surgeon: Lin Landsman, MD;  Location: [facility];  Service: Gastroenterology;  Laterality: N/A;   HERNIA REPAIR     OTHER SURGICAL HISTORY     tummy tuck   OTHER SURGICAL HISTORY     carpal tunnel   TUBAL LIGATION      Prior to Admission medications   Medication Sig Start Date End Date Taking? Authorizing Provider  ondansetron (ZOFRAN ODT) 4 MG disintegrating tablet Take 1 tablet (4 mg total) by mouth every 8 (eight) hours as needed for nausea or vomiting. 04/17/21  Yes Vladimir Crofts, MD  ALPRAZolam Duanne Moron) 0.25 MG tablet Take 1 tablet (0.25 mg total) by mouth 2 (two) times daily as needed for anxiety. 10/08/20   Mar Daring, PA-C  fluticasone (FLONASE) 50 MCG/ACT nasal spray Place 2 sprays into both nostrils daily. 09/01/20   Flinchum, Kelby Aline, FNP  Krill Oil 1000 MG CAPS Take by mouth.    [provider]  loratadine (CLARITIN) 10 MG tablet Take 10 mg by mouth daily.    [provider]  losartan (COZAAR) 25 MG tablet Take 1 tablet (25 mg total) by mouth daily. 10/08/20   Mar Daring, PA-C  Vitamin D, Ergocalciferol, (DRISDOL) 1.25 MG (50000 UNIT) CAPS capsule Take 1 capsule (50,000 Units total) by mouth  every 7 (seven) days. (taking one tablet per week) walk in lab in office 1-2 weeks after completing prescription. 08/04/20   Flinchum, Kelby Aline, FNP    Allergies Shellfish allergy  Family History  Problem Relation Age of Onset   Heart murmur Mother    Thyroid disease Daughter     Social History Social History   Tobacco Use   Smoking status: Former    Packs/day: 0.50    Years: 21.00    Pack years: 10.50    Types: Cigarettes    Quit date: 03/29/2018    Years since quitting: 3.0    Smokeless tobacco: Never  Vaping Use   Vaping Use: Never used  Substance Use Topics   Alcohol use: Yes    Alcohol/week: 2.0 standard drinks    Types: 2 Glasses of wine per week   Drug use: Never    Review of Systems  Constitutional: No fever/chills Eyes: No visual changes. ENT: No sore throat. Cardiovascular: Denies chest pain. Respiratory: Denies shortness of breath. Gastrointestinal: Positive for left flank pain, radiating towards her left groin.  Nausea and vomiting.  No diarrhea.  No constipation. Genitourinary: Negative for dysuria. Musculoskeletal: Negative for back pain. Skin: Negative for rash. Neurological: Negative for headaches, focal weakness or numbness.  ____________________________________________   PHYSICAL EXAM:  VITAL SIGNS: Vitals:   04/17/21 1123  BP: 129/86  Pulse: 81  Resp: 18  Temp: 98.1 F (36.7 C)  SpO2: 99%    Constitutional: Alert and oriented.  Obese.  Obviously uncomfortable, laying on her right side and a rocking in bed. Eyes: Conjunctivae are normal. PERRL. EOMI. Head: Atraumatic. Nose: No congestion/rhinnorhea. Mouth/Throat: Mucous membranes are moist.  Oropharynx non-erythematous. Neck: No stridor. No cervical spine tenderness to palpation. Cardiovascular: Normal rate, regular rhythm. Grossly normal heart sounds.  Good peripheral circulation. Respiratory: Normal respiratory effort.  No retractions. Lungs CTAB. Gastrointestinal: Soft , nondistended.  Point localizing left-sided abdominal tenderness without peritoneal features.  Left-sided CVA tenderness is present.  None on the right and right-sided abdomen is benign. Musculoskeletal: No lower extremity tenderness nor edema.  No joint effusions. No signs of acute trauma. Neurologic:  Normal speech and language. No gross focal neurologic deficits are appreciated. No gait instability noted. Skin:  Skin is warm, dry and intact. No rash noted. Psychiatric: Mood and affect are normal.  Speech and behavior are normal. ____________________________________________   LABS (all labs ordered are listed, but only abnormal results are displayed)  Labs Reviewed  BASIC METABOLIC PANEL - Abnormal; Notable for the following components:      Result Value   Glucose, Bld 109 (*)    All other components within normal limits  URINALYSIS, COMPLETE (UACMP) WITH MICROSCOPIC - Abnormal; Notable for the following components:   Color, Urine AMBER (*)    APPearance CLOUDY (*)    Specific Gravity, Urine >1.030 (*)    Hgb urine dipstick LARGE (*)    Bilirubin Urine SMALL (*)    Ketones, ur TRACE (*)    Protein, ur 30 (*)    RBC / HPF >50 (*)    Bacteria, UA MANY (*)    All other components within normal limits  CBC   ____________________________________________  12 Lead EKG   ____________________________________________  RADIOLOGY  ED MD interpretation: CT renal study reviewed by me with distal left ureteral stone with associated hydronephrosis.  Official radiology report(s): CT Renal Stone Study  Result Date: 04/17/2021 CLINICAL DATA:  Left flank pain. EXAM: CT ABDOMEN AND PELVIS WITHOUT  CONTRAST TECHNIQUE: Multidetector CT imaging of the abdomen and pelvis was performed following the standard protocol without IV contrast. COMPARISON:  None. FINDINGS: Lower chest: No acute abnormality. Bilateral breast implants are partially imaged. Hepatobiliary: A 7 mm cyst is seen in the left hepatic lobe (series 5, image 35). No gallstones, gallbladder wall thickening, or biliary dilatation. Pancreas: Unremarkable. No pancreatic ductal dilatation or surrounding inflammatory changes. Spleen: Normal in size without focal abnormality. Adrenals/Urinary Tract: Adrenal glands are unremarkable. An obstructing 4 mm calculus is seen at the left ureterovesical junction resulting in moderate left hydroureteronephrosis. Multiple additional nonobstructing calculi are seen in the left kidney, measuring up to 6  mm. The right kidney is normal, without renal calculi, focal lesion, or hydronephrosis. The urinary bladder is nearly empty. Stomach/Bowel: Stomach is within normal limits. No pericecal inflammatory changes are noted to suggest acute appendicitis. No evidence of bowel wall thickening, distention, or inflammatory changes. Vascular/Lymphatic: No significant vascular findings are present. No enlarged abdominal or pelvic lymph nodes. Reproductive: Uterus and bilateral adnexa are unremarkable. Other: No abdominal wall hernia or abnormality. No abdominopelvic ascites. Musculoskeletal: No acute or significant osseous findings. IMPRESSION: Obstructing 4 mm calculus at the left ureterovesical junction resulting in moderate left hydroureteronephrosis. Electronically Signed   By: Zerita Boers M.D.   On: 04/17/2021 12:11    ____________________________________________   PROCEDURES and INTERVENTIONS  Procedure(s) performed (including Critical Care):  Procedures  Medications  oxyCODONE-acetaminophen (PERCOCET/ROXICET) 5-325 MG per tablet 1 tablet (1 tablet Oral Given 04/17/21 1130)  ondansetron (ZOFRAN-ODT) disintegrating tablet 4 mg (4 mg Oral Given 04/17/21 1130)  lactated ringers bolus 1,000 mL (1,000 mLs Intravenous New Bag/Given 04/17/21 1622)  ondansetron (ZOFRAN) injection 4 mg (4 mg Intravenous Given 04/17/21 1622)  ketorolac (TORADOL) 30 MG/ML injection 15 mg (15 mg Intravenous Given 04/17/21 1623)  HYDROmorphone (DILAUDID) injection 0.5 mg (0.5 mg Intravenous Given 04/17/21 1623)    ____________________________________________   MDM / ED COURSE   50 year old woman with history of nephrolithiasis presents to the ED with symptomatic left renal colic, with a distal ureteral stone, passing in the ED and amenable to outpatient management.  Normal vitals.  No evidence of renal dysfunction, sepsis or infected urine.  CT with distal left ureteral stone with some mild hydronephrosis associated with this.   After fluid resuscitation and analgesics, she clinically passes the stone here in the ED.  We will discharge with return precautions and referral to urology.  Clinical Course as of 04/17/21 1718  Sat Apr 17, 2021  1714 Reassessed.  Patient reports feeling much better.  Has some residual mild discomfort, likely due to hydronephrosis.  Husband is now at the bedside.  We discussed kidney stones, urology follow-up and return precautions.  We will pursue p.o. challenge and expected outpatient management. [DS]    Clinical Course User Index [DS] Vladimir Crofts, MD    ____________________________________________   FINAL CLINICAL IMPRESSION(S) / ED DIAGNOSES  Final diagnoses:  Ureterolithiasis  Nephrolithiasis     ED Discharge Orders          Ordered    ondansetron (ZOFRAN ODT) 4 MG disintegrating tablet  Every 8 hours PRN        04/17/21 1717             Ritaj Dullea   Note:  This document was prepared using Dragon voice recognition software and may include unintentional dictation errors.    Vladimir Crofts, MD 04/17/21 [id number]

## 2021-04-17 NOTE — ED Triage Notes (Addendum)
Pt via POV from home. Pt c/o L sided flank pain and nausea since this AM. Pt has a hx of kidney stone states this feel similar. Pt is A&Ox4 but seems to be uncomfortable and in pain.

## 2021-04-17 NOTE — Discharge Instructions (Addendum)
Use Tylenol for pain and fevers.  Up to 1000 mg per dose, up to 4 times per day.  Do not take more than 4000 mg of Tylenol/acetaminophen within 24 hours..  Use naproxen/Aleve for anti-inflammatory pain relief. Use up to 500mg every 12 hours. Do not take more frequently than this. Do not use other NSAIDs (ibuprofen, Advil) while taking this medication. It is safe to take Tylenol with this.   

## 2021-04-19 ENCOUNTER — Other Ambulatory Visit: Payer: Self-pay

## 2021-04-19 ENCOUNTER — Emergency Department: Payer: 59

## 2021-04-19 ENCOUNTER — Ambulatory Visit: Payer: Self-pay | Admitting: *Deleted

## 2021-04-19 ENCOUNTER — Emergency Department
Admission: EM | Admit: 2021-04-19 | Discharge: 2021-04-19 | Disposition: A | Discharge status: Home / Self Care | Payer: 59 | Attending: Emergency Medicine | Admitting: Emergency Medicine

## 2021-04-19 DIAGNOSIS — R3129 Other microscopic hematuria: Secondary | ICD-10-CM

## 2021-04-19 DIAGNOSIS — N23 Unspecified renal colic: Secondary | ICD-10-CM | POA: Insufficient documentation

## 2021-04-19 DIAGNOSIS — Z87891 Personal history of nicotine dependence: Secondary | ICD-10-CM | POA: Insufficient documentation

## 2021-04-19 DIAGNOSIS — R109 Unspecified abdominal pain: Secondary | ICD-10-CM | POA: Diagnosis present

## 2021-04-19 DIAGNOSIS — K219 Gastro-esophageal reflux disease without esophagitis: Secondary | ICD-10-CM | POA: Insufficient documentation

## 2021-04-19 DIAGNOSIS — Z79899 Other long term (current) drug therapy: Secondary | ICD-10-CM | POA: Insufficient documentation

## 2021-04-19 DIAGNOSIS — I10 Essential (primary) hypertension: Secondary | ICD-10-CM | POA: Diagnosis not present

## 2021-04-19 LAB — URINALYSIS, COMPLETE (UACMP) WITH MICROSCOPIC
Bilirubin Urine: NEGATIVE
Glucose, UA: NEGATIVE mg/dL
Ketones, ur: NEGATIVE mg/dL
Leukocytes,Ua: NEGATIVE
Nitrite: NEGATIVE
Protein, ur: NEGATIVE mg/dL
RBC / HPF: >50 RBC/hpf — ABNORMAL HIGH (ref 0–5)
Specific Gravity, Urine: 1.02 (ref 1.005–1.030)
pH: 6 (ref 5.0–8.0)

## 2021-04-19 MED ORDER — OXYCODONE-ACETAMINOPHEN 5-325 MG PO TABS: 1.0000 | ORAL_TABLET | ORAL | Status: DC | PRN

## 2021-04-19 MED ORDER — ACETAMINOPHEN 500 MG PO TABS
1000.0000 mg | ORAL_TABLET | Freq: Once | ORAL | Status: AC
  Administered 2021-04-19: 1000 mg via ORAL
  Filled 2021-04-19: qty 2

## 2021-04-19 MED ORDER — ONDANSETRON HCL 4 MG/2ML IJ SOLN: 4.0000 mg | Freq: Once | INTRAMUSCULAR | Status: DC

## 2021-04-19 MED ORDER — KETOROLAC TROMETHAMINE 30 MG/ML IJ SOLN
30.0000 mg | Freq: Once | INTRAMUSCULAR | Status: AC
  Administered 2021-04-19: 30 mg via INTRAMUSCULAR
  Filled 2021-04-19: qty 1

## 2021-04-19 MED ORDER — OXYCODONE HCL 5 MG PO TABS: 5.0000 mg | ORAL_TABLET | Freq: Three times a day (TID) | ORAL | 0 refills | Status: DC | PRN

## 2021-04-19 MED ORDER — ONDANSETRON 4 MG PO TBDP
4.0000 mg | ORAL_TABLET | Freq: Once | ORAL | Status: AC
  Administered 2021-04-19: 4 mg via ORAL
  Filled 2021-04-19: qty 1

## 2021-04-19 MED ORDER — LACTATED RINGERS IV BOLUS: 1000.0000 mL | Freq: Once | INTRAVENOUS | Status: DC

## 2021-04-19 MED ORDER — KETOROLAC TROMETHAMINE 30 MG/ML IJ SOLN: 15.0000 mg | Freq: Once | INTRAMUSCULAR | Status: DC

## 2021-04-19 NOTE — Telephone Encounter (Signed)
Pt seen in ED 04/14/21, imaging showed kidney stone. ED note states stone was passed. Pt calling today with 15/10 left lower back pain. States "I did not pass the stone, I just felt better because they gave me pain medication." Reports "Felt fine yesterday, during night pain started again, now excruciating" Also reports nausea. Pt sounds distressed. Advised ED, declines. Requesting quick referral to urologist. Advised needs care/eval now, referrals take time to schedule. Assured pt NT would route to practice for PCPs review and final disposition.  Last saw M. Flinchum 3/23/22Pt stated "I'll just take care of this myself."  CB# if needed [phone]    Reason for Disposition  [1] SEVERE pain (e.g., excruciating, scale 8-10) AND [2] not improved after pain medicine  Answer Assessment - Initial Assessment Questions 1. MAIN CONCERN OR SYMPTOM:  "What is your main concern right now?" "What question do you have?" "What's the main symptom you're worried about?" (e.g., blood in urine, flank pain)     Left side flank, back 2. ONSET: "When did the  *No Answer*  start?"     BAck today, ED saturday 3. BETTER-SAME-WORSE: "Are you getting better, staying the same, or getting worse compared to how you felt at your last visit to the doctor (most recent medical visit)?"     worse 4. VISIT DATE: "When were you seen?" (Date)     ED SAturday 5. VISIT DOCTOR: "What is the name of the doctor (or NP/PA) taking care of you now?"     Unsure 6. VISIT DIAGNOSIS:  "What was the main symptom or problem that you were seen for?" "Were you given a diagnosis?"      Kidney stones 7. TREATMENT: "Did you have any treatment for your kidney stone?" (e.g., none, doctor exam, lithotripsy, medicines, stent) If Yes, ask: "When did you have this treatment?"     No. IVFs pain meds 8. NEXT APPOINTMENT: "Do you have a follow-up appointment with your doctor?"     Referral to urology 9. PAIN: "Is there any pain?" If Yes, ask: "How bad is  it?"  (Scale 0-10; or mild, moderate, severe)    - NONE (0): no pain    - MILD (1-3): doesn't interfere with normal activities     - MODERATE (4-7): interferes with normal activities or awakens from sleep     - SEVERE (8-10): excruciating pain, unable to do any normal activities     15/10 10. FEVER: "Do you have a fever?" If Yes, ask: "What is it, how was it measured, and when did it start?"       no 11. OTHER SYMPTOMS: "Do you have any other symptoms?" (e.g., abdomen pain, blood in urine, vomiting)        Nausea  Protocols used: Kidney Stone Follow-up Call-A-[facility]

## 2021-04-19 NOTE — ED Triage Notes (Signed)
Patient reports dx with kidney stone on Saturday. Patient c/o left flank pain.

## 2021-04-19 NOTE — ED Notes (Signed)
See triage note  presents with flank pain states she developed some pain on Saturday  pain is left flank area

## 2021-04-19 NOTE — Telephone Encounter (Signed)
FYI

## 2021-04-19 NOTE — Discharge Instructions (Signed)
Please take Tylenol and ibuprofen/Advil for your pain.  It is safe to take them together, or to alternate them every few hours.  Take up to 1000mg  of Tylenol at a time, up to 4 times per day.  Do not take more than 4000 mg of Tylenol in 24 hours.  For ibuprofen, take 400-600 mg, 4-5 times per day.  Use oxycodone as needed on top of this for more severe/breakthrough pain.  Do not drive while take this medication.  Follow-up with urologist on Wednesday.  Return to ED with any worsening symptoms or fevers despite these measures.

## 2021-04-19 NOTE — ED Provider Notes (Signed)
[facility] Emergency Department Provider Note ____________________________________________   Event Date/Time   First MD Initiated Contact with Patient 04/19/21 1241     (approximate)  I have reviewed the triage vital signs and the nursing notes.  HISTORY  Chief Complaint Flank Pain   HPI Joan Johnson is a 50 y.o. femalewho presents to the ED for evaluation of flank pain.  Chart review indicates I saw the patient 2 days ago and remember her distinctly.  She was seen for acute left-sided flank pain and diagnosed with distal left-sided ureterolithiasis, provided rehydration, analgesia and had clinical passage of the stone in the ED.  Patient returns to the ED due to recurrence of her pain.  She reports feeling well after discharge after about 24 hours of, until developing some mild aching pain to her left flank last night.  She slept okay.  Awoke this morning and developed worsening sharp pain from her left flank radiating towards her left groin.  Up to 10/10 intensity, now waning 6/10 intensity without intervention or medication.  She denies any fevers, dysuria, diarrhea, emesis, syncopal episodes.  Does report some associated nausea.  Past Medical History:  Diagnosis Date   Allergy    Arthritis    History of kidney stones    Hypertension    Migraine     Patient Active Problem List   Diagnosis Date Noted   Globus sensation 09/10/2020   Allergic rhinitis 09/01/2020   Hair loss 09/01/2020   Hypertension 09/01/2020   Hx of migraines 07/29/2020   Gastroesophageal reflux disease 07/29/2020   Dysphagia 07/29/2020   Colon cancer screening 07/29/2020   Vitamin D deficiency 07/29/2020   Lung nodule 07/29/2020   Vision changes 07/29/2020   Fatigue 07/29/2020   Spasm of cervical paraspinous muscle 07/29/2020   Rheumatoid arthritis with positive rheumatoid factor ([place]) 10/17/2017   Class 1 obesity without serious comorbidity in adult 09/19/2017     Past Surgical History:  Procedure Laterality Date   [place]   COLONOSCOPY WITH PROPOFOL N/A 10/01/2020   Procedure: COLONOSCOPY WITH PROPOFOL;  Surgeon: Lin Landsman, MD;  Location: [facility] ENDOSCOPY;  Service: Gastroenterology;  Laterality: N/A;   COSMETIC SURGERY     ESOPHAGOGASTRODUODENOSCOPY (EGD) WITH PROPOFOL N/A 10/01/2020   Procedure: ESOPHAGOGASTRODUODENOSCOPY (EGD) WITH PROPOFOL;  Surgeon: Lin Landsman, MD;  Location: [place];  Service: Gastroenterology;  Laterality: N/A;   HERNIA REPAIR     OTHER SURGICAL HISTORY     tummy tuck   OTHER SURGICAL HISTORY     carpal tunnel   TUBAL LIGATION      Prior to Admission medications   Medication Sig Start Date End Date Taking? Authorizing Provider  oxyCODONE (ROXICODONE) 5 MG immediate release tablet Take 1 tablet (5 mg total) by mouth every 8 (eight) hours as needed. 04/19/21 04/19/22 Yes Vladimir Crofts, MD  ALPRAZolam Duanne Moron) 0.25 MG tablet Take 1 tablet (0.25 mg total) by mouth 2 (two) times daily as needed for anxiety. 10/08/20   Mar Daring, PA-C  fluticasone (FLONASE) 50 MCG/ACT nasal spray Place 2 sprays into both nostrils daily. 09/01/20   Flinchum, Kelby Aline, FNP  Krill Oil 1000 MG CAPS Take by mouth.    [provider]  loratadine (CLARITIN) 10 MG tablet Take 10 mg by mouth daily.    [provider]  losartan (COZAAR) 25  MG tablet Take 1 tablet (25 mg total) by mouth daily. 10/08/20   Mar Daring, PA-C  ondansetron (ZOFRAN ODT) 4 MG disintegrating tablet Take 1 tablet (4 mg total) by mouth every 8 (eight) hours as needed for nausea or vomiting. 04/17/21   Vladimir Crofts, MD  Vitamin D, Ergocalciferol, (DRISDOL) 1.25 MG (50000 UNIT) CAPS capsule Take 1 capsule (50,000 Units total) by mouth every 7 (seven) days. (taking one tablet per week) walk in lab in office 1-2 weeks after completing  prescription. 08/04/20   Flinchum, Kelby Aline, FNP    Allergies Shellfish allergy  Family History  Problem Relation Age of Onset   Heart murmur Mother    Thyroid disease Daughter     Social History Social History   Tobacco Use   Smoking status: Former    Packs/day: 0.50    Years: 21.00    Pack years: 10.50    Types: Cigarettes    Quit date: 03/29/2018    Years since quitting: 3.0   Smokeless tobacco: Never  Vaping Use   Vaping Use: Never used  Substance Use Topics   Alcohol use: Yes    Alcohol/week: 2.0 standard drinks    Types: 2 Glasses of wine per week   Drug use: Never    Review of Systems  Constitutional: No fever/chills Eyes: No visual changes. ENT: No sore throat. Cardiovascular: Denies chest pain. Respiratory: Denies shortness of breath. Gastrointestinal: No abdominal pain.  No nausea, no vomiting.  No diarrhea.  No constipation. Genitourinary: Negative for dysuria. Positive for left flank pain radiating towards her groin Musculoskeletal: Negative for back pain. Skin: Negative for rash. Neurological: Negative for headaches, focal weakness or numbness.   ____________________________________________   PHYSICAL EXAM:  VITAL SIGNS: Vitals:   04/19/21 1232  BP: 116/84  Pulse: 79  Resp: 17  Temp: 97.7 F (36.5 C)  SpO2: 99%     Constitutional: Alert and oriented. Well appearing and in no acute distress.  Obese.  Laying on her left side.  Comfortable without distress.  Conversational full sentences. Eyes: Conjunctivae are normal. PERRL. EOMI. Head: Atraumatic. Nose: No congestion/rhinnorhea. Mouth/Throat: Mucous membranes are moist.  Oropharynx non-erythematous. Neck: No stridor. No cervical spine tenderness to palpation. Cardiovascular: Normal rate, regular rhythm. Grossly normal heart sounds.  Good peripheral circulation. Respiratory: Normal respiratory effort.  No retractions. Lungs CTAB. Gastrointestinal: Soft , nondistended.  Very mild and  poorly localizing left-sided flank tenderness and left-sided CVA tenderness, no peritoneal features or localizing symptoms.  No skin changes, erythema or induration. Benign frontal abdomen. Musculoskeletal: No lower extremity tenderness nor edema.  No joint effusions. No signs of acute trauma. Neurologic:  Normal speech and language. No gross focal neurologic deficits are appreciated. No gait instability noted. Skin:  Skin is warm, dry and intact. No rash noted. Psychiatric: Mood and affect are normal. Speech and behavior are normal.  ____________________________________________   LABS (all labs ordered are listed, but only abnormal results are displayed)  Labs Reviewed  URINALYSIS, COMPLETE (UACMP) WITH MICROSCOPIC - Abnormal; Notable for the following components:      Result Value   APPearance CLEAR (*)    Hgb urine dipstick LARGE (*)    RBC / HPF >50 (*)    Bacteria, UA FEW (*)    All other components within normal limits   ____________________________________________  12 Lead EKG   ____________________________________________  RADIOLOGY  ED MD interpretation:    Official radiology report(s): No results found.  ____________________________________________   PROCEDURES and  INTERVENTIONS  Procedure(s) performed (including Critical Care):  Procedures  Medications  ketorolac (TORADOL) 30 MG/ML injection 30 mg (30 mg Intramuscular Given 04/19/21 1306)  ondansetron (ZOFRAN-ODT) disintegrating tablet 4 mg (4 mg Oral Given 04/19/21 1305)  acetaminophen (TYLENOL) tablet 1,000 mg (1,000 mg Oral Given 04/19/21 1305)    ____________________________________________   MDM / ED COURSE   50 year old female with history of recent ureterolithiasis presents to the ED with recurrence of pain, now waning.  Shared decision making with the patient is plan to pursue urinalysis and symptomatic control.  Declines blood work and CT imaging at this time, do think is reasonable.  She  appears clinically well without evidence of systemic illness, sepsis, peritoneal abdominal pathology.  Resolving symptoms on recheck after Toradol and Tylenol.  Urinalysis without infectious features, and demonstrate stigmata of ureterolithiasis.  Anticipate another passed stone.  She has follow-up with urology on Wednesday and I see no indications for more emergent evaluation.  We will discharge with symptomatic measures and return precautions for the ED.  Clinical Course as of 04/19/21 1348  Mon Apr 19, 2021  1254 We discussed plan of care. No iv, no blood work.  [DS]  [id number].  [DS]  [id number] Patient reports feeling better.  We discussed urinalysis with signs of ureterolithiasis.  Has benign examination.  We discussed following up with urology on Wednesday and return precautions for the ED. [DS]    Clinical Course User Index [DS] Vladimir Crofts, MD    ____________________________________________   FINAL CLINICAL IMPRESSION(S) / ED DIAGNOSES  Final diagnoses:  Ureteral colic  Other microscopic hematuria     ED Discharge Orders          Ordered    oxyCODONE (ROXICODONE) 5 MG immediate release tablet  Every 8 hours PRN        04/19/21 1348             Joan Johnson   Note:  This document was prepared using Systems analyst and may include unintentional dictation errors.    Vladimir Crofts, MD 04/19/21 [id number]

## 2021-04-20 NOTE — Progress Notes (Signed)
04/21/21 10:47 AM   Joan Johnson Aug 01, 1971 [id number]  Referring provider:  Doreen Beam, FNP [address]  [place] [address] Chief Complaint  Patient presents with   Nephrolithiasis     HPI: Joan Johnson is a 50 y.o.female who presents today for further evaluation of stones.   She was seen and evaluated in the ED on 04/17/2021 for evaluation of left-sided flank pain. CT renal stone study revealed obstructive 4 mm calculus at the left UVJ resulting in moderate left hydroureteronephrosis. He urinalysis showed large blood, trace ketones, >50 RBCs, many bacteria, and calcium oxalate crystals were present.   She was seen again in the ED on 04/19/2021 for worsening left flank pain. Her urinalysis revealed large blood, >50 RBCs, and few bacteria.   She reports today that she underwent shockwave lithotripsy for a previous stone in the kidney. She states she usually can pass her stones but her recent encounter was very painful and she was unable to pass the stone.  She is still feeling pain today but she is able to manage it without medication.  No fevers or chills.  No dysuria.  She does not like narcotics.  She has codeine No. 3 and Zofran at home.  She was not given Flomax per her report or urinary strainer.   PMH: Past Medical History:  Diagnosis Date   Allergy    Arthritis    History of kidney stones    Hypertension    Migraine     Surgical History: Past Surgical History:  Procedure Laterality Date   [place]   COLONOSCOPY WITH PROPOFOL N/A 10/01/2020   Procedure: COLONOSCOPY WITH PROPOFOL;  Surgeon: Lin Landsman, MD;  Location: [facility] ENDOSCOPY;  Service: Gastroenterology;  Laterality: N/A;   COSMETIC SURGERY     ESOPHAGOGASTRODUODENOSCOPY (EGD) WITH PROPOFOL N/A 10/01/2020   Procedure: ESOPHAGOGASTRODUODENOSCOPY (EGD) WITH PROPOFOL;   Surgeon: Lin Landsman, MD;  Location: [place];  Service: Gastroenterology;  Laterality: N/A;   HERNIA REPAIR     OTHER SURGICAL HISTORY     tummy tuck   OTHER SURGICAL HISTORY     carpal tunnel   TUBAL LIGATION      Home Medications:  Allergies as of 04/21/2021       Reactions   Shellfish Allergy Hives        Medication List        Accurate as of April 21, 2021 10:47 AM. If you have any questions, ask your nurse or doctor.          STOP taking these medications    fluticasone 50 MCG/ACT nasal spray Commonly known as: FLONASE Stopped by: Hollice Espy, MD   loratadine 10 MG tablet Commonly known as: CLARITIN Stopped by: Hollice Espy, MD   ondansetron 4 MG disintegrating tablet Commonly known as: Zofran ODT Stopped by: Hollice Espy, MD   oxyCODONE 5 MG immediate release tablet Commonly known as: Roxicodone Stopped by: Hollice Espy, MD   Vitamin D (Ergocalciferol) 1.25 MG (50000 UNIT) Caps capsule Commonly known as: DRISDOL Stopped by: Hollice Espy, MD       TAKE these medications    ALPRAZolam 0.25 MG tablet Commonly known as: XANAX Take 1 tablet (0.25 mg total) by mouth 2 (two) times daily as needed for anxiety.   atorvastatin 10 MG tablet Commonly known as: LIPITOR Take  10 mg by mouth daily.   hydroxychloroquine 200 MG tablet Commonly known as: PLAQUENIL Take 200 mg by mouth daily.   Krill Oil 1000 MG Caps Take by mouth.   losartan 25 MG tablet Commonly known as: COZAAR Take 1 tablet (25 mg total) by mouth daily.   spironolactone 25 MG tablet Commonly known as: ALDACTONE Take 25 mg by mouth 2 (two) times daily.   tamsulosin 0.4 MG Caps capsule Commonly known as: FLOMAX Take 1 capsule (0.4 mg total) by mouth daily. Started by: Hollice Espy, MD        Allergies:  Allergies  Allergen Reactions   Shellfish Allergy Hives    Family History: Family History  Problem Relation Age of Onset   Heart murmur  Mother    Thyroid disease Daughter     Social History:  reports that she quit smoking about 3 years ago. Her smoking use included cigarettes. She has a 10.50 pack-year smoking history. She has never used smokeless tobacco. She reports current alcohol use of about 2.0 standard drinks per week. She reports that she does not use drugs.   Physical Exam: BP 108/71   Pulse 73   Ht 5\' 3"  (1.6 m)   Wt 184 lb (83.5 kg)   BMI 32.59 kg/m   Constitutional:  Alert and oriented, No acute distress.  Companied today by her 2 young daughters. HEENT: [place] AT, moist mucus membranes.  Trachea midline, no masses. Cardiovascular: No clubbing, cyanosis, or edema. Respiratory: Normal respiratory effort, no increased work of breathing. Skin: No rashes, bruises or suspicious lesions. Neurologic: Grossly intact, no focal deficits, moving all 4 extremities. Psychiatric: Normal mood and affect.  Laboratory Data:  Lab Results  Component Value Date   CREATININE 0.83 04/17/2021     Pertinent Imaging: CLINICAL DATA:  Left flank pain.   EXAM: CT ABDOMEN AND PELVIS WITHOUT CONTRAST   TECHNIQUE: Multidetector CT imaging of the abdomen and pelvis was performed following the standard protocol without IV contrast.   COMPARISON:  None.   FINDINGS: Lower chest: No acute abnormality. Bilateral breast implants are partially imaged.   Hepatobiliary: A 7 mm cyst is seen in the left hepatic lobe (series 5, image 35). No gallstones, gallbladder wall thickening, or biliary dilatation.   Pancreas: Unremarkable. No pancreatic ductal dilatation or surrounding inflammatory changes.   Spleen: Normal in size without focal abnormality.   Adrenals/Urinary Tract: Adrenal glands are unremarkable. An obstructing 4 mm calculus is seen at the left ureterovesical junction resulting in moderate left hydroureteronephrosis. Multiple additional nonobstructing calculi are seen in the left kidney, measuring up to 6 mm. The  right kidney is normal, without renal calculi, focal lesion, or hydronephrosis. The urinary bladder is nearly empty.   Stomach/Bowel: Stomach is within normal limits. No pericecal inflammatory changes are noted to suggest acute appendicitis. No evidence of bowel wall thickening, distention, or inflammatory changes.   Vascular/Lymphatic: No significant vascular findings are present. No enlarged abdominal or pelvic lymph nodes.   Reproductive: Uterus and bilateral adnexa are unremarkable.   Other: No abdominal wall hernia or abnormality. No abdominopelvic ascites.   Musculoskeletal: No acute or significant osseous findings.   IMPRESSION: Obstructing 4 mm calculus at the left ureterovesical junction resulting in moderate left hydroureteronephrosis.     Electronically Signed   By: Zerita Boers M.D.   On: 04/17/2021 12:11   I have personally reviewed the images and agree with radiologist interpretation.  Additionally she does have left distal ureteral stones x2.  She also has multiple nonobstructing stones.  Assessment & Plan:    Left distal ureteral stone  - We discussed various treatment options for urolithiasis including observation with or without medical expulsive therapy, shockwave lithotripsy (SWL), ureteroscopy and laser lithotripsy with stent placement, and percutaneous nephrolithotomy.   We discussed that management is based on stone size, location, density, patient co-morbidities, and patient preference.    Stones <60mm in size have a >80% spontaneous passage rate. Data surrounding the use of tamsulosin for medical expulsive therapy is controversial, but meta analyses suggests it is most efficacious for distal stones between 5-63mm in size. Possible side effects include dizziness/lightheadedness, and retrograde ejaculation.   SWL has a lower stone free rate in a single procedure, but also a lower complication rate compared to ureteroscopy and avoids a stent and  associated stent related symptoms. Possible complications include renal hematoma, steinstrasse, and need for additional treatment. We discussed the role of his increased skin to stone distance can lead to decreased efficacy with shockwave lithotripsy.   Ureteroscopy with laser lithotripsy and stent placement has a higher stone free rate than SWL in a single procedure, however increased complication rate including possible infection, ureteral injury, bleeding, and stent related morbidity. Common stent related symptoms include dysuria, urgency/frequency, and flank pain.   After an extensive discussion of the risks and benefits of the above treatment options, the patient would like to proceed with ESWL  -  Since she chose ESWL a KUB is needed to make sure we can see the stone   - Flomax prescribed; urinary strainer provided  -She does not have a ride tomorrow, husband is out of town.  She is like to do this next week.  She passes her stone in the interim, she will let us know.  We will still need to follow her for her left-sided nonobstructing stones if she passes the distal stones.  Left hydronephrosis  -Secondary to #1  Flank pain  - No concern for infection, urine negative today.    I,Kailey Littlejohn,acting as a Education administrator for Hollice Espy, MD.,have documented all relevant documentation on the behalf of Hollice Espy, MD,as directed by  Hollice Espy, MD while in the presence of Hollice Espy, MD.  I have reviewed the above documentation for accuracy and completeness, and I agree with the above.   Hollice Espy, MD    [facility] [address] [place], [place] [address] [phone]

## 2021-04-21 ENCOUNTER — Ambulatory Visit
Admission: RE | Admit: 2021-04-21 | Discharge: 2021-04-21 | Disposition: A | Discharge status: Home / Self Care | Payer: 59 | Attending: Urology | Admitting: Urology

## 2021-04-21 ENCOUNTER — Ambulatory Visit: Payer: 59 | Admitting: Urology

## 2021-04-21 ENCOUNTER — Other Ambulatory Visit: Payer: Self-pay

## 2021-04-21 ENCOUNTER — Encounter: Payer: Self-pay | Admitting: Urology

## 2021-04-21 ENCOUNTER — Ambulatory Visit
Admission: RE | Admit: 2021-04-21 | Discharge: 2021-04-21 | Disposition: A | Discharge status: Home / Self Care | Payer: 59 | Source: Ambulatory Visit | Attending: Urology | Admitting: Urology

## 2021-04-21 VITALS — BP 108/71 | HR 73 | Ht 63.0 in | Wt 184.0 lb

## 2021-04-21 DIAGNOSIS — N200 Calculus of kidney: Secondary | ICD-10-CM

## 2021-04-21 MED ORDER — TAMSULOSIN HCL 0.4 MG PO CAPS: 0.4000 mg | ORAL_CAPSULE | Freq: Every day | ORAL | 0 refills | Status: DC

## 2021-04-22 ENCOUNTER — Telehealth: Payer: Self-pay | Admitting: *Deleted

## 2021-04-22 LAB — MICROSCOPIC EXAMINATION
Bacteria, UA: NONE SEEN
RBC, Urine: NONE SEEN /hpf (ref 0–2)

## 2021-04-22 LAB — URINALYSIS, COMPLETE
Bilirubin, UA: NEGATIVE
Glucose, UA: NEGATIVE
Ketones, UA: NEGATIVE
Leukocytes,UA: NEGATIVE
Nitrite, UA: NEGATIVE
Protein,UA: NEGATIVE
RBC, UA: NEGATIVE
Specific Gravity, UA: 1.025 (ref 1.005–1.030)
Urobilinogen, Ur: 2 mg/dL — ABNORMAL HIGH (ref 0.2–1.0)
pH, UA: 6 (ref 5.0–7.5)

## 2021-04-22 NOTE — Telephone Encounter (Addendum)
Patient advised, voiced understanding. Scheduled follow up.  ----- Message from Hollice Espy, MD sent at 04/22/2021  8:33 AM EDT ----- I believe this patient probably passed her stone, don't see it on the KUB and her urine was clear in the office.  I think we should cancel her ESWL next week and see how she feels.  We can see her back in 1 month to check in and talk about whether she wants to treat her nonobstructing stones.  Hollice Espy, MD

## 2021-05-02 ENCOUNTER — Other Ambulatory Visit: Payer: Self-pay | Admitting: Urology

## 2021-05-16 IMAGING — CR DG ANKLE COMPLETE 3+V*L*
3 series · 3 of 3 positions shown · non-contrast
Comparison: None.

CLINICAL DATA: Nontraumatic left ankle edema, bruising

EXAM:
LEFT ANKLE COMPLETE - 3+ VIEW

[ankle ap]
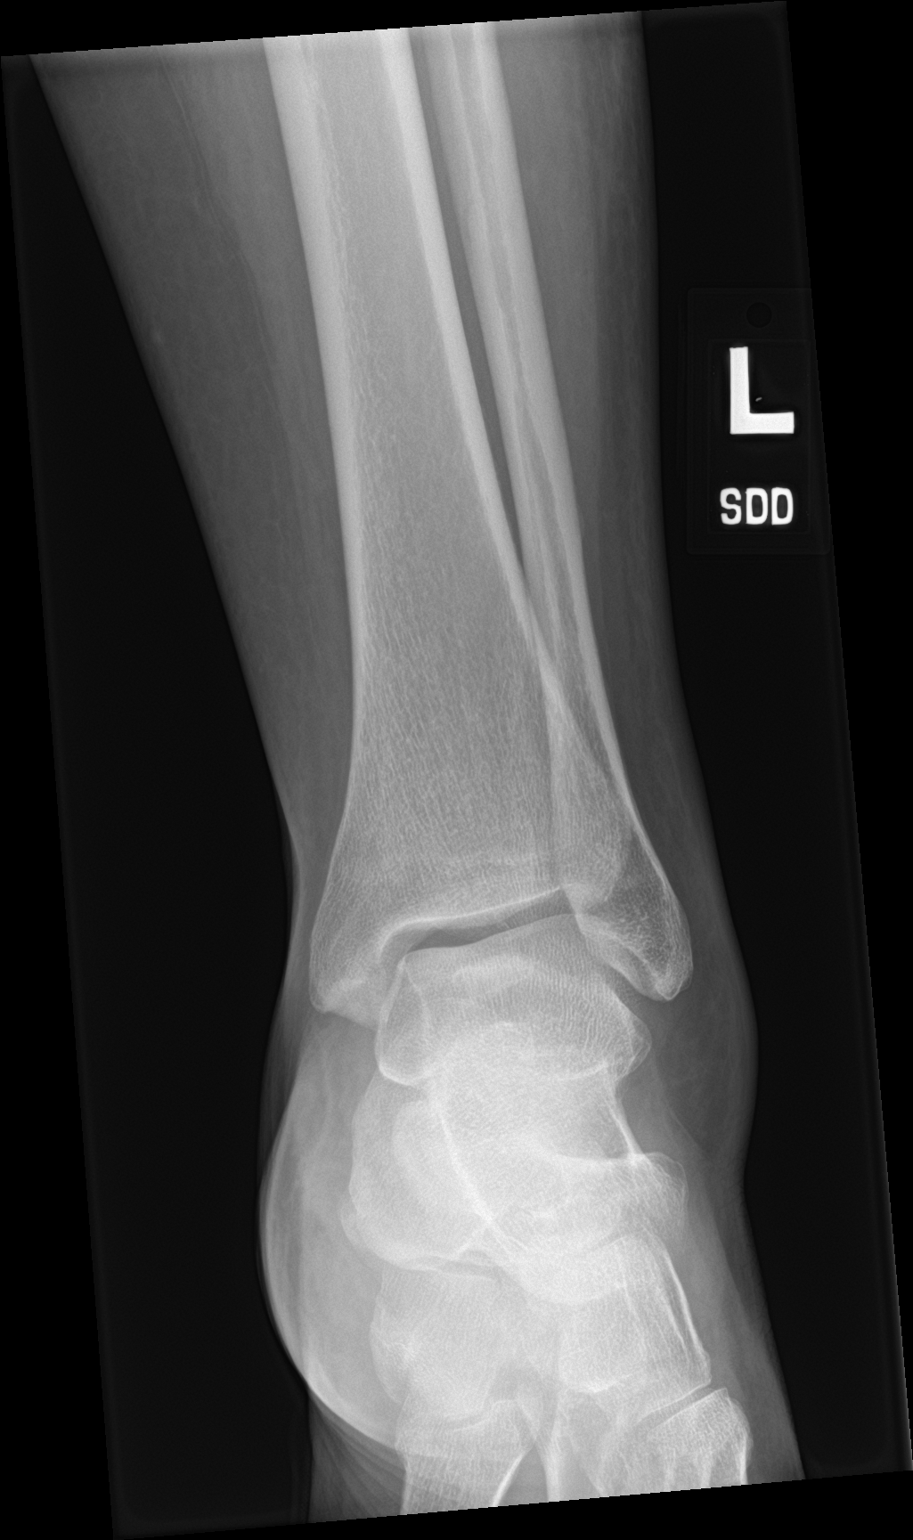

[ankle obl]
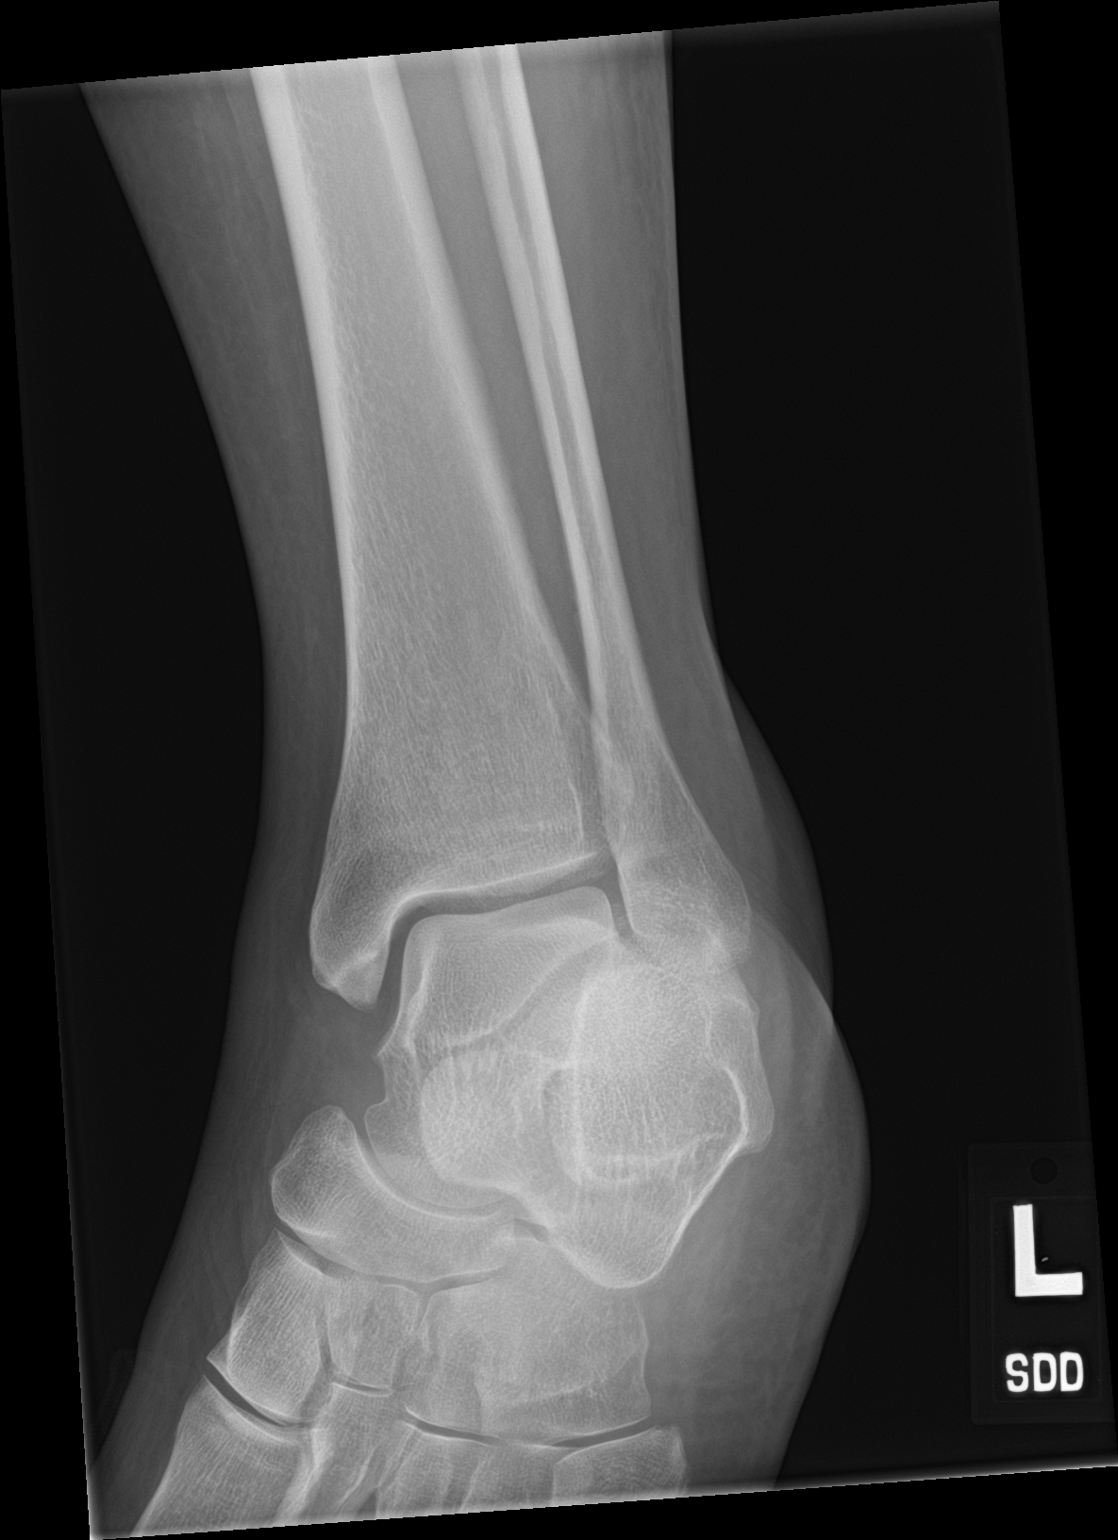

[ankle lat]
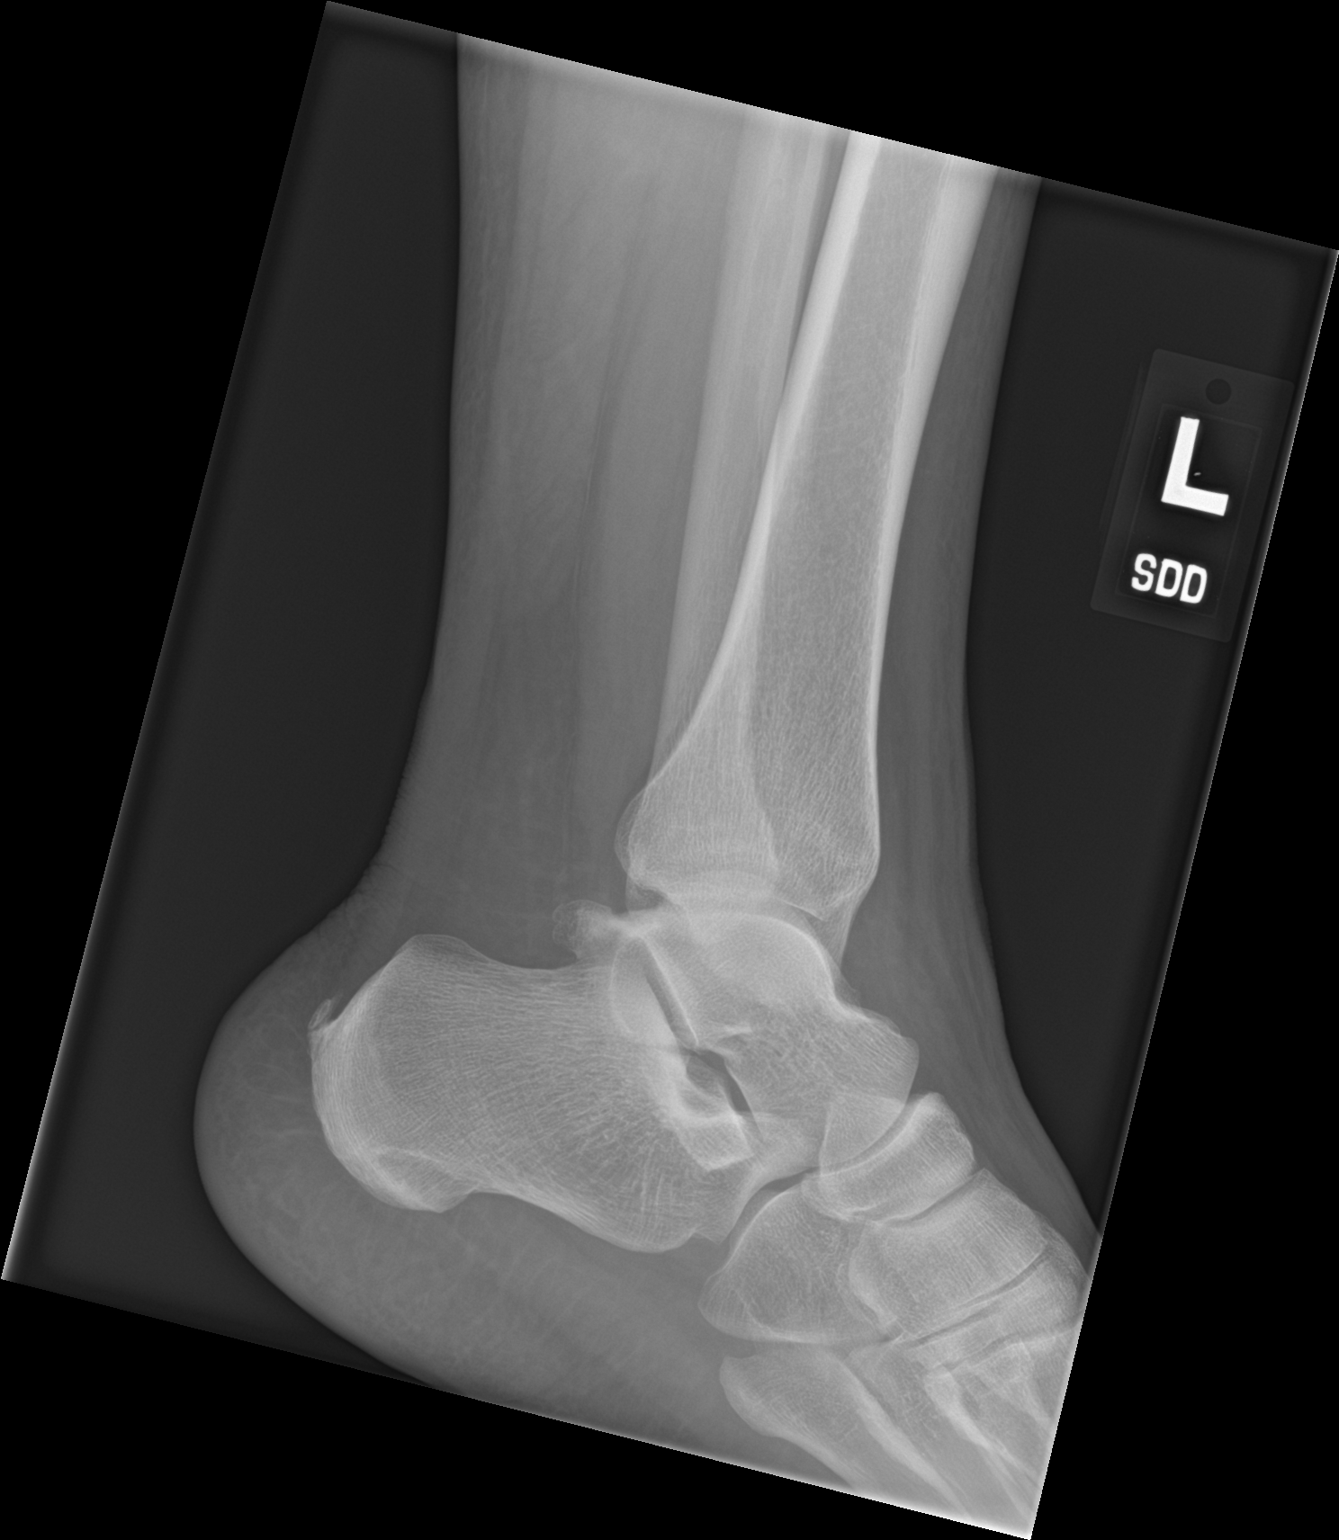

[3 of 3 positions shown; findings below may reference images not displayed]

FINDINGS: Frontal, oblique, lateral views of the left ankle are obtained. No
fracture, subluxation, or dislocation. Joint spaces are well
preserved. There is mild lateral soft tissue swelling.
IMPRESSION: 1. Lateral soft tissue swelling.  No acute bony abnormality.

## 2021-05-24 NOTE — Progress Notes (Incomplete)
05/24/21 10:08 AM   Joan Johnson Jul 19, 1971 [id number]  Referring provider:  Doreen Beam, FNP [address]  [place] [address] No chief complaint on file.    HPI: Joan Johnson is a 50 y.o.female with a personal history of left distal ureteral stone, left hydronephrosis, and flank pain, who presents today for 1 month follow-up.   On 04/17/2021 she underwent a CT renal stone study for left sided flank pain that revealed obstructive 4 mm calculus at the left UVJ resulting in moderate left hydroureteronephrosis.   On 04/21/2021 she underwent a KUB to further evaluate kidney stone, it revealed a left renal calculi, interval passage of the previously seen UVJ stone in the left, and punctuate phlebolith in the left hemipelvis versus likely a stone in the distal left ureter.   She underwent ESWL for a previous stone in the past.       PMH: Past Medical History:  Diagnosis Date   Allergy    Arthritis    History of kidney stones    Hypertension    Migraine     Surgical History: Past Surgical History:  Procedure Laterality Date   [place]   COLONOSCOPY WITH PROPOFOL N/A 10/01/2020   Procedure: COLONOSCOPY WITH PROPOFOL;  Surgeon: Lin Landsman, MD;  Location: [facility] ENDOSCOPY;  Service: Gastroenterology;  Laterality: N/A;   COSMETIC SURGERY     ESOPHAGOGASTRODUODENOSCOPY (EGD) WITH PROPOFOL N/A 10/01/2020   Procedure: ESOPHAGOGASTRODUODENOSCOPY (EGD) WITH PROPOFOL;  Surgeon: Lin Landsman, MD;  Location: [place];  Service: Gastroenterology;  Laterality: N/A;   HERNIA REPAIR     OTHER SURGICAL HISTORY     tummy tuck   OTHER SURGICAL HISTORY     carpal tunnel   TUBAL LIGATION      Home Medications:  Allergies as of 05/25/2021       Reactions   Shellfish Allergy Hives        Medication List        Accurate as of May 24, 2021 10:08 AM. If you have any questions, ask your nurse or doctor.          ALPRAZolam 0.25 MG tablet Commonly known as: XANAX Take 1 tablet (0.25 mg total) by mouth 2 (two) times daily as needed for anxiety.   atorvastatin 10 MG tablet Commonly known as: LIPITOR Take 10 mg by mouth daily.   hydroxychloroquine 200 MG tablet Commonly known as: PLAQUENIL Take 200 mg by mouth daily.   Krill Oil 1000 MG Caps Take by mouth.   losartan 25 MG tablet Commonly known as: COZAAR Take 1 tablet (25 mg total) by mouth daily.   spironolactone 25 MG tablet Commonly known as: ALDACTONE Take 25 mg by mouth 2 (two) times daily.   tamsulosin 0.4 MG Caps capsule Commonly known as: FLOMAX TAKE 1 CAPSULE(0.4 MG) BY MOUTH DAILY        Allergies:  Allergies  Allergen Reactions   Shellfish Allergy Hives    Family History: Family History  Problem Relation Age of Onset   Heart murmur Mother    Thyroid disease Daughter     Social History:  reports that she quit smoking about 3 years ago. Her smoking use included cigarettes. She has a 10.50 pack-year smoking history. She has never used smokeless tobacco. She reports current alcohol use of about 2.0 standard  drinks per week. She reports that she does not use drugs.   Physical Exam: There were no vitals taken for this visit.  Constitutional:  Alert and oriented, No acute distress. HEENT: [place] AT, moist mucus membranes.  Trachea midline, no masses. Cardiovascular: No clubbing, cyanosis, or edema. Respiratory: Normal respiratory effort, no increased work of breathing. Skin: No rashes, bruises or suspicious lesions. Neurologic: Grossly intact, no focal deficits, moving all 4 extremities. Psychiatric: Normal mood and affect.  Laboratory Data:  Lab Results  Component Value Date   CREATININE 0.83 04/17/2021     Urinalysis   Pertinent Imaging: CLINICAL DATA:  Kidney stone.   EXAM: ABDOMEN - 1 VIEW   COMPARISON:  CT dated  04/17/2021.   FINDINGS: Several small stones noted in the left kidney. No radiopaque foci noted over the right renal silhouette. There is a punctate radiopaque focus in the left hemipelvis which may represent a phlebolith or corresponds to the previously seen stone in the distal left ureter. The larger stone seen at the left UVJ on the prior CT is no longer visualized on this radiograph.   No bowel dilatation or evidence of obstruction. No free air. No acute osseous pathology.   IMPRESSION: 1. Left renal calculi. 2. Interval passage of the previously seen stone at the left UVJ. 3. Punctate phlebolith in the left hemipelvis versus less likely a stone in the distal left ureter.     Electronically Signed   By: Anner Crete M.D.   On: 04/22/2021 02:29   Assessment & Plan:     No follow-ups on file.  I,Kailey Littlejohn,acting as a Education administrator for Hollice Espy, MD.,have documented all relevant documentation on the behalf of Hollice Espy, MD,as directed by  Hollice Espy, MD while in the presence of Hollice Espy, [place] [address], [place], [place] [address] [phone]

## 2021-05-25 ENCOUNTER — Ambulatory Visit: Payer: 59 | Admitting: Urology

## 2021-05-26 ENCOUNTER — Ambulatory Visit: Payer: 59 | Admitting: Obstetrics and Gynecology

## 2021-06-01 DIAGNOSIS — M8580 Other specified disorders of bone density and structure, unspecified site: Secondary | ICD-10-CM

## 2021-06-01 HISTORY — DX: Other specified disorders of bone density and structure, unspecified site: M85.80

## 2021-06-02 ENCOUNTER — Ambulatory Visit: Payer: Self-pay

## 2021-06-02 NOTE — Patient Instructions (Signed)
Preventive Care 40-51 Years Old, Female Preventive care refers to lifestyle choices and visits with your health care provider that can promote health and wellness. This includes: A yearly physical exam. This is also called an annual wellness visit. Regular dental and eye exams. Immunizations. Screening for certain conditions. Healthy lifestyle choices, such as: Eating a healthy diet. Getting regular exercise. Not using drugs or products that contain nicotine and tobacco. Limiting alcohol use. What can I expect for my preventive care visit? Physical exam Your health care provider will check your: Height and weight. These may be used to calculate your BMI (body mass index). BMI is a measurement that tells if you are at a healthy weight. Heart rate and blood pressure. Body temperature. Skin for abnormal spots. Counseling Your health care provider may ask you questions about your: Past medical problems. Family's medical history. Alcohol, tobacco, and drug use. Emotional well-being. Home life and relationship well-being. Sexual activity. Diet, exercise, and sleep habits. Work and work environment. Access to firearms. Method of birth control. Menstrual cycle. Pregnancy history. What immunizations do I need? Vaccines are usually given at various ages, according to a schedule. Your health care provider will recommend vaccines for you based on your age, medical history, and lifestyle or other factors, such as travel or where you work. What tests do I need? Blood tests Lipid and cholesterol levels. These may be checked every 5 years, or more often if you are over 51 years old. Hepatitis C test. Hepatitis B test. Screening Lung cancer screening. You may have this screening every year starting at age 51 if you have a 30-pack-year history of smoking and currently smoke or have quit within the past 15 years. Colorectal cancer screening. All adults should have this screening starting at  age 51 and continuing until age 75. Your health care provider may recommend screening at age 45 if you are at increased risk. You will have tests every 1-10 years, depending on your results and the type of screening test. Diabetes screening. This is done by checking your blood sugar (glucose) after you have not eaten for a while (fasting). You may have this done every 1-3 years. Mammogram. This may be done every 1-2 years. Talk with your health care provider about when you should start having regular mammograms. This may depend on whether you have a family history of breast cancer. BRCA-related cancer screening. This may be done if you have a family history of breast, ovarian, tubal, or peritoneal cancers. Pelvic exam and Pap test. This may be done every 3 years starting at age 21. Starting at age 30, this may be done every 5 years if you have a Pap test in combination with an HPV test. Other tests STD (sexually transmitted disease) testing, if you are at risk. Bone density scan. This is done to screen for osteoporosis. You may have this scan if you are at high risk for osteoporosis. Talk with your health care provider about your test results, treatment options, and if necessary, the need for more tests. Follow these instructions at home: Eating and drinking  Eat a diet that includes fresh fruits and vegetables, whole grains, lean protein, and low-fat dairy products. Take vitamin and mineral supplements as recommended by your health care provider. Do not drink alcohol if: Your health care provider tells you not to drink. You are pregnant, may be pregnant, or are planning to become pregnant. If you drink alcohol: Limit how much you have to 0-1 drink a day. Be   aware of how much alcohol is in your drink. In the [place] one drink equals one 12 oz bottle of beer (355 mL), one 5 oz glass of wine (148 mL), or one 1 oz glass of hard liquor (44 mL). Lifestyle Take daily care of your teeth and  gums. Brush your teeth every morning and night with fluoride toothpaste. Floss one time each day. Stay active. Exercise for at least 30 minutes 5 or more days each week. Do not use any products that contain nicotine or tobacco, such as cigarettes, e-cigarettes, and chewing tobacco. If you need help quitting, ask your health care provider. Do not use drugs. If you are sexually active, practice safe sex. Use a condom or other form of protection to prevent STIs (sexually transmitted infections). If you do not wish to become pregnant, use a form of birth control. If you plan to become pregnant, see your health care provider for a prepregnancy visit. If told by your health care provider, take low-dose aspirin daily starting at age 51. Find healthy ways to cope with stress, such as: Meditation, yoga, or listening to music. Journaling. Talking to a trusted person. Spending time with friends and family. Safety Always wear your seat belt while driving or riding in a vehicle. Do not drive: If you have been drinking alcohol. Do not ride with someone who has been drinking. When you are tired or distracted. While texting. Wear a helmet and other protective equipment during sports activities. If you have firearms in your house, make sure you follow all gun safety procedures. What's next? Visit your health care provider once a year for an annual wellness visit. Ask your health care provider how often you should have your eyes and teeth checked. Stay up to date on all vaccines. This information is not intended to replace advice given to you by your health care provider. Make sure you discuss any questions you have with your health care provider. Document Revised: 09/25/2020 Document Reviewed: 03/29/2018 Elsevier Patient Education  2022 [employer].

## 2021-06-02 NOTE — Progress Notes (Signed)
Established patient visit   Patient: Joan Johnson   DOB: 09-04-1970   50 y.o. Female  MRN: [id number] Visit Date: 06/03/2021  Today's healthcare provider: [name], PA-C   Cc. TMJ pain  Subjective    HPI  Rosetta is a 50 y/o female who presents today with concerns of left jaw/ear pain for the last week.  She states she has struggled with TMJ previously and this feels similar, a throbbing pain that is worse to touch.  Reports some chronic nasal congestion but denies any upper respiratory symptoms.  She also recently moved back to the area from [place] is requesting some refills of her spironolactone which she takes for hair loss as well as the losartan for her high blood pressure.  She has plans to travel via plane back to [place] and requesting Xanax for this plane ride.  She does not take this medication daily and only needs it for travel.  She has questions regarding memory loss, she drives a schoolbus and a coworker recently asked her if she had dementia as she did not recognize one of the children immediately. Jenny reports this was not her usual bus route and she has only been in this job for the last 3 weeks.This makes her very paranoid that she has is having memory issues.  Reports other things: not remembering if she put deodorant on, forgetting things she said in a conversation with her daughter a week ago, walking into her room and forgetting what she came in for.  Reports all family members deny all her ideas of having dementia or memory loss.  She also used to take [facility] for weight loss when she lived in [place].  He is wondering if she can restart this medication here.   Medications: Outpatient Medications Prior to Visit  Medication Sig   atorvastatin (LIPITOR) 10 MG tablet Take 10 mg by mouth daily.   hydroxychloroquine (PLAQUENIL) 200 MG tablet Take 200 mg by mouth daily.   Krill Oil 1000 MG CAPS Take by mouth.   [DISCONTINUED] ALPRAZolam (XANAX)  0.25 MG tablet Take 1 tablet (0.25 mg total) by mouth 2 (two) times daily as needed for anxiety.   [DISCONTINUED] losartan (COZAAR) 50 MG tablet Take 50 mg by mouth daily.   [DISCONTINUED] spironolactone (ALDACTONE) 25 MG tablet Take 25 mg by mouth 2 (two) times daily.   [DISCONTINUED] losartan (COZAAR) 25 MG tablet Take 1 tablet (25 mg total) by mouth daily. (Patient not taking: Reported on 06/03/2021)   [DISCONTINUED] tamsulosin (FLOMAX) 0.4 MG CAPS capsule TAKE 1 CAPSULE(0.4 MG) BY MOUTH DAILY (Patient not taking: Reported on 06/03/2021)   No facility-administered medications prior to visit.    Review of Systems  Constitutional:  Positive for fatigue. Negative for fever and unexpected weight change.  HENT:  Positive for congestion and ear pain.   Respiratory:  Negative for cough and shortness of breath.   Cardiovascular:  Negative for chest pain.  Musculoskeletal:  Negative for arthralgias, neck pain and neck stiffness.  Neurological:  Negative for light-headedness.       Potential memory issues   Last CBC Lab Results  Component Value Date   WBC 6.8 04/17/2021   HGB 13.8 04/17/2021   HCT 40.0 04/17/2021   MCV 92.0 04/17/2021   MCH 31.7 04/17/2021   RDW 12.9 04/17/2021   PLT 271 89/21/1942   Last metabolic panel Lab Results  Component Value Date   GLUCOSE 109 (H) 04/17/2021  NA 138 04/17/2021   K 3.6 04/17/2021   CL 102 04/17/2021   CO2 25 04/17/2021   BUN 16 04/17/2021   CREATININE 0.83 04/17/2021   GFRNONAA >60 04/17/2021   CALCIUM 9.4 04/17/2021   PROT 7.3 10/08/2020   ALBUMIN 4.7 10/08/2020   LABGLOB 2.6 10/08/2020   AGRATIO 1.8 10/08/2020   BILITOT 1.1 10/08/2020   ALKPHOS 85 10/08/2020   AST 18 10/08/2020   ALT 27 10/08/2020   ANIONGAP 11 04/17/2021   Last lipids Lab Results  Component Value Date   CHOL 218 (H) 10/08/2020   HDL 48 10/08/2020   LDLCALC 149 (H) 10/08/2020   TRIG 119 10/08/2020   CHOLHDL 4.5 (H) 10/08/2020   Last hemoglobin A1c No  results found for: HGBA1C Last thyroid functions Lab Results  Component Value Date   TSH 1.380 07/30/2020   Last vitamin D Lab Results  Component Value Date   VD25OH 36.1 10/08/2020   Last vitamin B12 and Folate Lab Results  Component Value Date   VITAMINB12 362 07/30/2020       Objective    BP 124/80 (BP Location: Left Arm, Patient Position: Sitting, Cuff Size: Normal)   Pulse 91   Temp 97.7 F (36.5 C) (Oral)   Ht 5\' 2"  (1.575 m)   Wt 185 lb 1.6 oz (84 kg)   SpO2 100%   BMI 33.86 kg/m  BP Readings from Last 3 Encounters:  06/03/21 124/80  04/21/21 108/71  04/19/21 116/84   Wt Readings from Last 3 Encounters:  06/03/21 185 lb 1.6 oz (84 kg)  04/21/21 184 lb (83.5 kg)  04/19/21 184 lb (83.5 kg)      Physical Exam Constitutional:      General: She is not in acute distress.    Appearance: Normal appearance.  HENT:     Head:     Jaw: There is normal jaw occlusion. Tenderness present. No pain on movement.     Comments: Tenderness to left masseter    Right Ear: Tympanic membrane, ear canal and external ear normal. There is no impacted cerumen.     Nose: Congestion present. No rhinorrhea.     Mouth/Throat:     Mouth: Mucous membranes are moist.  Cardiovascular:     Rate and Rhythm: Normal rate.  Pulmonary:     Effort: Pulmonary effort is normal.  Neurological:     General: No focal deficit present.     Mental Status: She is alert and oriented to person, place, and time.     No results found for any visits on 06/03/21.  Assessment & Plan     Problem List Items Addressed This Visit       Cardiovascular and Mediastinum   Hypertension    Pt taking Losartan 50 mg daily.  BP today 124/80, stable and controlled. She has also been taking spironolactone 25 mg BID for hair loss--this may contribute to the well controlled BP. Unsure what BP was before initiation.       Relevant Medications   spironolactone (ALDACTONE) 25 MG tablet   losartan (COZAAR) 50 MG  tablet     Musculoskeletal and Integument   TMJ (temporomandibular joint syndrome) - Primary    Advised naproxen 500 mg BID for pain and inflammation relief. Flexeril 5 mg up to TID--do not take while driving, best before bed.      Relevant Medications   cyclobenzaprine (FLEXERIL) 5 MG tablet   naproxen (NAPROSYN) 500 MG tablet     Other  Hair thinning    [place] spironolactone 25 mg bid for hair thinning, she has been doing this for the last 3-6 months. We can rediscuss need for medication or potential referall to derm at another visit. Requested refill, complied.       Relevant Medications   spironolactone (ALDACTONE) 25 MG tablet   Morbid obesity (HCC)    Pt interested in [place] she states she took for 1 mo in [place] Will check labs first and recommend follow up in the next 1-2 months to discuss options for weight loss further       Relevant Orders   Lipid Profile   Comprehensive Metabolic Panel (CMET)   HgB A1c   Nasal congestion    Pt taking zyrtec daily. Encouraged flonase daily for at least two weeks to see if it benefits her congestion.      Mixed hyperlipidemia    Not currently taking statin consistently per pt is taking krill oil. Will recheck and determine need for statin      Relevant Medications   spironolactone (ALDACTONE) 25 MG tablet   losartan (COZAAR) 50 MG tablet   Other Relevant Orders   Lipid Profile   Comprehensive Metabolic Panel (CMET)   HgB A1c   Memory changes    Reassured patient with changing jobs and moving, it is normal to be a little more forgetful. Advised her to trust her family's opinion, if they notice some unusual forgetfulness or changes, we can re-discuss.  No abnormalities appreciated during visit, but we can monitor.      Other Visit Diagnoses     Situational anxiety       Relevant Medications   ALPRAZolam (XANAX) 0.25 MG tablet        Return in about 1 month (around 07/03/2021) for  hypertension, weight Management, hyperlipidemia.      I, [name], PA-C have reviewed all documentation for this visit. The documentation on  06/03/2021  for the exam, diagnosis, procedures, and orders are all accurate and complete.    [name], PA-C  [facility] [phone] (phone) [phone] (fax)  [place]

## 2021-06-02 NOTE — Telephone Encounter (Signed)
Pt called back and states she has had Left earache since yesterday morning.  She c/o of pain 4/10. Denies fever or runny nose or congestion. States that she does use Q-tips but hasn't lately. She also says that she sleeps with a fan and doesn't know if that could have caused her ear pain or not. Advised that first available appt was for 06/08/21 but due to pt needing to be seen sooner, called and spoke to [place], [facility] who states Ria Comment, [place] has appt tomorrow at 1040. Verifed with pt that the appt is ok and she agreed. Appt scheduled by Elmyra Ricks, FC. Care advice given and pt verbalized understanding.    Message from Valere Dross sent at 06/02/2021  9:44 AM EDT  Pt called in stating she thinks she has a ear ache and wanted someone to reach out to see what she can do. Please advise.     Reason for Disposition  Earache  (Exceptions: brief ear pain of < 60 minutes duration, earache occurring during air travel  Answer Assessment - Initial Assessment Questions 1. LOCATION: "Which ear is involved?"     Left 2. ONSET: "When did the ear start hurting"      Yesterday morning 3. SEVERITY: "How bad is the pain?"  (Scale 1-10; mild, moderate or severe)   - MILD (1-3): doesn't interfere with normal activities    - MODERATE (4-7): interferes with normal activities or awakens from sleep    - SEVERE (8-10): excruciating pain, unable to do any normal activities      Comes and goes, 4 4. URI SYMPTOMS: "Do you have a runny nose or cough?"     no 5. FEVER: "Do you have a fever?" If Yes, ask: "What is your temperature, how was it measured, and when did it start?"     No 6. CAUSE: "Have you been swimming recently?", "How often do you use Q-TIPS?", "Have you had any recent air travel or scuba diving?"     Qtips 7. OTHER SYMPTOMS: "Do you have any other symptoms?" (e.g., headache, stiff neck, dizziness, vomiting, runny nose, decreased hearing)     Migraines but not current 8. PREGNANCY: "Is there any chance you are  pregnant?" "When was your last menstrual period?"     No  Protocols used: Bethann Punches

## 2021-06-03 ENCOUNTER — Ambulatory Visit: Payer: 59 | Admitting: Physician Assistant

## 2021-06-03 ENCOUNTER — Encounter: Payer: Self-pay | Admitting: Physician Assistant

## 2021-06-03 ENCOUNTER — Other Ambulatory Visit: Payer: Self-pay

## 2021-06-03 VITALS — BP 124/80 | HR 91 | Temp 97.7°F | Ht 62.0 in | Wt 185.1 lb

## 2021-06-03 DIAGNOSIS — F418 Other specified anxiety disorders: Secondary | ICD-10-CM | POA: Diagnosis not present

## 2021-06-03 DIAGNOSIS — R0981 Nasal congestion: Secondary | ICD-10-CM | POA: Diagnosis not present

## 2021-06-03 DIAGNOSIS — I10 Essential (primary) hypertension: Secondary | ICD-10-CM

## 2021-06-03 DIAGNOSIS — M26609 Unspecified temporomandibular joint disorder, unspecified side: Secondary | ICD-10-CM | POA: Diagnosis not present

## 2021-06-03 DIAGNOSIS — E782 Mixed hyperlipidemia: Secondary | ICD-10-CM

## 2021-06-03 DIAGNOSIS — L659 Nonscarring hair loss, unspecified: Secondary | ICD-10-CM | POA: Diagnosis not present

## 2021-06-03 DIAGNOSIS — R413 Other amnesia: Secondary | ICD-10-CM

## 2021-06-03 MED ORDER — LOSARTAN POTASSIUM 50 MG PO TABS: 50.0000 mg | ORAL_TABLET | Freq: Every day | ORAL | 2 refills | Status: DC

## 2021-06-03 MED ORDER — SPIRONOLACTONE 25 MG PO TABS: 25.0000 mg | ORAL_TABLET | Freq: Two times a day (BID) | ORAL | 0 refills | Status: DC

## 2021-06-03 MED ORDER — NAPROXEN 500 MG PO TABS: 500.0000 mg | ORAL_TABLET | Freq: Two times a day (BID) | ORAL | 0 refills | Status: AC

## 2021-06-03 MED ORDER — ALPRAZOLAM 0.25 MG PO TABS: 0.2500 mg | ORAL_TABLET | Freq: Two times a day (BID) | ORAL | 0 refills | Status: DC | PRN

## 2021-06-03 MED ORDER — CYCLOBENZAPRINE HCL 5 MG PO TABS: 5.0000 mg | ORAL_TABLET | Freq: Three times a day (TID) | ORAL | 1 refills | Status: DC | PRN

## 2021-06-03 NOTE — Assessment & Plan Note (Signed)
Pt taking zyrtec daily. Encouraged flonase daily for at least two weeks to see if it benefits her congestion.

## 2021-06-03 NOTE — Assessment & Plan Note (Signed)
Pt interested in [place] she states she took for 1 mo in [place] Will check labs first and recommend follow up in the next 1-2 months to discuss options for weight loss further

## 2021-06-03 NOTE — Assessment & Plan Note (Signed)
[place] spironolactone 25 mg bid for hair thinning, she has been doing this for the last 3-6 months. We can rediscuss need for medication or potential referall to derm at another visit. Requested refill, complied.

## 2021-06-03 NOTE — Assessment & Plan Note (Signed)
Advised naproxen 500 mg BID for pain and inflammation relief. Flexeril 5 mg up to TID--do not take while driving, best before bed.

## 2021-06-03 NOTE — Assessment & Plan Note (Signed)
Not currently taking statin consistently per pt is taking krill oil. Will recheck and determine need for statin

## 2021-06-03 NOTE — Assessment & Plan Note (Signed)
Pt taking Losartan 50 mg daily.  BP today 124/80, stable and controlled. She has also been taking spironolactone 25 mg BID for hair loss--this may contribute to the well controlled BP. Unsure what BP was before initiation.

## 2021-06-03 NOTE — Assessment & Plan Note (Signed)
Reassured patient with changing jobs and moving, it is normal to be a little more forgetful. Advised her to trust her family's opinion, if they notice some unusual forgetfulness or changes, we can re-discuss.  No abnormalities appreciated during visit, but we can monitor.

## 2021-06-08 NOTE — Progress Notes (Signed)
06/09/21 11:03 AM   Joan Johnson May 10, 1971 [id number]  Referring provider:  Doreen Beam, FNP [address]  [place] [address] Chief Complaint  Patient presents with   Nephrolithiasis     HPI: Joan Johnson is a 50 y.o.female with a personal history of left distal ureteral stone, left hydronephrosis, and flank pain, who presents today for 1 month follow-up.  CT abdomen and pelvis revealed adrenal glands are unremarkable. An obstructing 4 mm calculus is seen at the left ureterovesical junction resulting in moderate left hydroureteronephrosis. Multiple additional nonobstructing calculi are seen in the left kidney, measuring up to 6 mm. The right kidney is normal, without renal calculi, focal lesion, or hydronephrosis. The urinary bladder is nearly empty.  CT renal stone study on 04/21/2021 revealed obstructive 4 mm calculus at the left UVJ resulting in moderate left hydroureteronephrosis.  04/21/2021 KUB revealed left renal calculi, interval passage of left UVJ stone, and punctate phlebolith in the left hemipelvis versus likely a stone in the distal left ureter.   She underwent shockwave lithotripsy for a previous stone in the kidney for which she brings the operative report today, in 2018 in [place].  She returns today primarily to discuss management of her nonobstructing stones, multiple up to 6 to 7 mm all on the left.  PMH: Past Medical History:  Diagnosis Date   Allergy    Arthritis    History of kidney stones    Hypertension    Migraine     Surgical History: Past Surgical History:  Procedure Laterality Date   [place]   COLONOSCOPY WITH PROPOFOL N/A 10/01/2020   Procedure: COLONOSCOPY WITH PROPOFOL;  Surgeon: Lin Landsman, MD;  Location: [facility] ENDOSCOPY;  Service: Gastroenterology;  Laterality: N/A;   COSMETIC SURGERY      ESOPHAGOGASTRODUODENOSCOPY (EGD) WITH PROPOFOL N/A 10/01/2020   Procedure: ESOPHAGOGASTRODUODENOSCOPY (EGD) WITH PROPOFOL;  Surgeon: Lin Landsman, MD;  Location: [place];  Service: Gastroenterology;  Laterality: N/A;   HERNIA REPAIR     OTHER SURGICAL HISTORY     tummy tuck   OTHER SURGICAL HISTORY     carpal tunnel   TUBAL LIGATION      Home Medications:  Allergies as of 06/09/2021       Reactions   Shellfish Allergy Hives        Medication List        Accurate as of June 09, 2021 11:03 AM. If you have any questions, ask your nurse or doctor.          ALPRAZolam 0.25 MG tablet Commonly known as: XANAX Take 1 tablet (0.25 mg total) by mouth 2 (two) times daily as needed for anxiety.   atorvastatin 10 MG tablet Commonly known as: LIPITOR Take 10 mg by mouth daily.   cyclobenzaprine 5 MG tablet Commonly known as: FLEXERIL Take 1 tablet (5 mg total) by mouth 3 (three) times daily as needed for muscle spasms.   hydroxychloroquine 200 MG tablet Commonly known as: PLAQUENIL Take 200 mg by mouth daily.   Krill Oil 1000 MG Caps Take by mouth.   losartan 50 MG tablet Commonly known as: COZAAR Take 1 tablet (50 mg total) by mouth daily.   naproxen 500 MG tablet Commonly known as: Naprosyn Take 1 tablet (500 mg total) by mouth 2 (two) times daily with a meal  for 7 days.   spironolactone 25 MG tablet Commonly known as: ALDACTONE Take 1 tablet (25 mg total) by mouth 2 (two) times daily.        Allergies:  Allergies  Allergen Reactions   Shellfish Allergy Hives    Family History: Family History  Problem Relation Age of Onset   Heart murmur Mother    Thyroid disease Daughter     Social History:  reports that she quit smoking about 3 years ago. Her smoking use included cigarettes. She has a 10.50 pack-year smoking history. She has never used smokeless tobacco. She reports current alcohol use of about 2.0 standard drinks per week. She reports  that she does not use drugs.   Physical Exam: BP 112/78   Pulse 92   Ht 5\' 2"  (1.575 m)   Wt 185 lb (83.9 kg)   BMI 33.84 kg/m   Constitutional:  Alert and oriented, No acute distress. HEENT: [place] AT, moist mucus membranes.  Trachea midline, no masses. Cardiovascular: No clubbing, cyanosis, or edema. Respiratory: Normal respiratory effort, no increased work of breathing. Skin: No rashes, bruises or suspicious lesions. Neurologic: Grossly intact, no focal deficits, moving all 4 extremities. Psychiatric: Normal mood and affect.  Laboratory Data:  Lab Results  Component Value Date   CREATININE 0.76 06/08/2021    Assessment & Plan:    Left kidney stones   - We discussed various treatment options for urolithiasis including observation with or without medical expulsive therapy, shockwave lithotripsy (SWL), ureteroscopy and laser lithotripsy with stent placement, and percutaneous nephrolithotomy.   We discussed that management is based on stone size, location, density, patient co-morbidities, and patient preference.    We discussed given the multifocal nature of the stones in the left kidney, ureteroscopy would make most sense such that the entire stone burden could be treated with 1 procedure.   Ureteroscopy with laser lithotripsy and stent placement has a higher stone free rate than SWL in a single procedure, however increased complication rate including possible infection, ureteral injury, bleeding, and stent related morbidity. Common stent related symptoms include dysuria, urgency/frequency, and flank pain.   After an extensive discussion of the risks and benefits of the above treatment options, the patient would like to proceed with ureteroscopy.    Return for surgery will call to schedule.  I,Kailey Littlejohn,acting as a Education administrator for Hollice Espy, MD.,have documented all relevant documentation on the behalf of Hollice Espy, MD,as directed by  Hollice Espy, MD while in the  presence of Hollice Espy, MD.   I have reviewed the above documentation for accuracy and completeness, and I agree with the above.   Hollice Espy, MD   [facility] [address] [place], [place] [address] [phone]

## 2021-06-09 ENCOUNTER — Other Ambulatory Visit: Payer: Self-pay | Admitting: Physician Assistant

## 2021-06-09 ENCOUNTER — Ambulatory Visit: Payer: 59 | Admitting: Urology

## 2021-06-09 ENCOUNTER — Other Ambulatory Visit: Payer: Self-pay | Admitting: Urology

## 2021-06-09 ENCOUNTER — Other Ambulatory Visit: Payer: Self-pay

## 2021-06-09 VITALS — BP 112/78 | HR 92 | Ht 62.0 in | Wt 185.0 lb

## 2021-06-09 DIAGNOSIS — N200 Calculus of kidney: Secondary | ICD-10-CM | POA: Diagnosis not present

## 2021-06-09 DIAGNOSIS — E782 Mixed hyperlipidemia: Secondary | ICD-10-CM

## 2021-06-09 LAB — LIPID PANEL
Chol/HDL Ratio: 4.7 ratio — ABNORMAL HIGH (ref 0.0–4.4)
Cholesterol, Total: 223 mg/dL — ABNORMAL HIGH (ref 100–199)
HDL: 47 mg/dL (ref >39)
LDL Chol Calc (NIH): 156 mg/dL — ABNORMAL HIGH (ref 0–99)
Triglycerides: 113 mg/dL (ref 0–149)
VLDL Cholesterol Cal: 20 mg/dL (ref 5–40)

## 2021-06-09 LAB — COMPREHENSIVE METABOLIC PANEL
ALT: 17 IU/L (ref 0–32)
AST: 16 IU/L (ref 0–40)
Albumin/Globulin Ratio: 1.9 (ref 1.2–2.2)
Albumin: 4.2 g/dL (ref 3.8–4.9)
Alkaline Phosphatase: 58 IU/L (ref 44–121)
BUN/Creatinine Ratio: 20 (ref 9–23)
BUN: 15 mg/dL (ref 6–24)
Bilirubin Total: 1 mg/dL (ref 0.0–1.2)
CO2: 25 mmol/L (ref 20–29)
Calcium: 9.5 mg/dL (ref 8.7–10.2)
Chloride: 106 mmol/L (ref 96–106)
Creatinine, Ser: 0.76 mg/dL (ref 0.57–1.00)
Globulin, Total: 2.2 g/dL (ref 1.5–4.5)
Glucose: 83 mg/dL (ref 70–99)
Potassium: 5.4 mmol/L — ABNORMAL HIGH (ref 3.5–5.2)
Sodium: 142 mmol/L (ref 134–144)
Total Protein: 6.4 g/dL (ref 6.0–8.5)
eGFR: 95 mL/min/{1.73_m2} (ref >59)

## 2021-06-09 LAB — HEMOGLOBIN A1C
Est. average glucose Bld gHb Est-mCnc: 100 mg/dL
Hgb A1c MFr Bld: 5.1 % (ref 4.8–5.6)

## 2021-06-09 MED ORDER — ATORVASTATIN CALCIUM 10 MG PO TABS: 10.0000 mg | ORAL_TABLET | Freq: Every day | ORAL | 3 refills | Status: DC

## 2021-06-09 NOTE — Patient Instructions (Signed)
Ureteroscopy °Ureteroscopy is a procedure to check for and treat problems inside part of the urinary tract. In this procedure, a thin, flexible tube with a light at the end (ureteroscope) is used to look at the inside of the kidneys and the ureters. The ureters are the tubes that carry urine from the kidneys to the bladder. The ureteroscope is inserted into one or both of the ureters. °You may need this procedure if you have frequent urinary tract infections (UTIs), blood in your urine, or a stone in one of your ureters. A ureteroscopy can be done: °To find the cause of urine blockage in a ureter and to evaluate other abnormalities inside the ureters or kidneys. °To remove stones. °To remove or treat growths of tissue (polyps), abnormal tissue, and some types of tumors. °To remove a tissue sample and check it for disease under a microscope (biopsy). °Tell a health care provider about: °Any allergies you have. °All medicines you are taking, including vitamins, herbs, eye drops, creams, and over-the-counter medicines. °Any problems you or family members have had with anesthetic medicines. °Any blood disorders you have. °Any surgeries you have had. °Any medical conditions you have. °Whether you are pregnant or may be pregnant. °What are the risks? °Generally, this is a safe procedure. However, problems may occur, including: °Bleeding. °Infection. °Allergic reactions to medicines. °Scarring that narrows the ureter (stricture). °Creating a hole in the ureter (perforation). °What happens before the procedure? °Staying hydrated °Follow instructions from your health care provider about hydration, which may include: °Up to 2 hours before the procedure - you may continue to drink clear liquids, such as water, clear fruit juice, black coffee, and plain tea. ° °Eating and drinking restrictions °Follow instructions from your health care provider about eating and drinking, which may include: °8 hours before the procedure - stop  eating heavy meals or foods, such as meat, fried foods, or fatty foods. °6 hours before the procedure - stop eating light meals or foods, such as toast or cereal. °6 hours before the procedure - stop drinking milk or drinks that contain milk. °2 hours before the procedure - stop drinking clear liquids. °Medicines °Ask your health care provider about: °Changing or stopping your regular medicines. This is especially important if you are taking diabetes medicines or blood thinners. °Taking medicines such as aspirin and ibuprofen. These medicines can thin your blood. Do not take these medicines unless your health care provider tells you to take them. °Taking over-the-counter medicines, vitamins, herbs, and supplements. °General instructions °Do not use any products that contain nicotine or tobacco for at least 4 weeks before the procedure. These products include cigarettes, e-cigarettes, and chewing tobacco. If you need help quitting, ask your health care provider. °You may have a urine sample taken to check for infection. °Plan to have someone take you home from the hospital or clinic. °If you will be going home right after the procedure, plan to have someone with you for 24 hours. °Ask your health care provider what steps will be taken to help prevent infection. These may include: °Washing skin with a germ-killing soap. °Receiving antibiotic medicine. °What happens during the procedure? ° °An IV will be inserted into one of your veins. °You will be given one or more of the following: °A medicine to help you relax (sedative). °A medicine to make you fall asleep (general anesthetic). °A medicine that is injected into your spine to numb the area below and slightly above the injection site (spinal anesthetic). °The   part of your body that drains urine from your bladder (urethra) will be cleaned with a germ-killing solution. °The ureteroscope will be passed through your urethra into your bladder. °A salt-water solution will  be sent through the ureteroscope to fill your bladder. This will help the health care provider see the openings of your ureters more clearly. °The ureteroscope will be passed into your ureter. °If a growth is found, a biopsy may be done. °If a stone is found, it may be removed through the ureteroscope, or the stone may be broken up using a laser, shock waves, or electrical energy. °In some cases, if the ureter is too small, a tube may be inserted that keeps the ureter open (ureteral stent). The stent may be left in place for 1 or 2 weeks to keep the ureter open, and then the ureteroscopy procedure will be done. °The scope will be removed, and your bladder will be emptied. °The procedure may vary among health care providers and hospitals. °What can I expect after the procedure? °After your procedure, it is common to have: °Your blood pressure, heart rate, breathing rate, and blood oxygen level monitored until you leave the hospital or clinic. °A burning sensation when you urinate. You may be asked to urinate. °Blood in your urine. °Mild discomfort in your bladder area or kidney area when urinating. °A need to urinate more often or urgently. °Follow these instructions at home: °Medicines °Take over-the-counter and prescription medicines only as told by your health care provider. °If you were prescribed an antibiotic medicine, take it as told by your health care provider. Do not stop taking the antibiotic even if you start to feel better. °General instructions ° °If you were given a sedative during the procedure, it can affect you for several hours. Do not drive or operate machinery until your health care provider says that it is safe. °To relieve burning, take a warm bath or hold a warm washcloth over your groin. °Drink enough fluid to keep your urine pale yellow. °Drink two 8-ounce (237 mL) glasses of water every hour for the first 2 hours after you get home. °Continue to drink water often at home. °You can eat what  you normally do. °Keep all follow-up visits as told by your health care provider. This is important. °If you had a ureteral stent placed, ask your health care provider when you need to return to have it removed. °Contact a health care provider if you have: °Chills or a fever. °Burning pain for longer than 24 hours after the procedure. °Blood in your urine for longer than 24 hours after the procedure. °Get help right away if you have: °Large amounts of blood in your urine. °Blood clots in your urine. °Severe pain. °Chest pain or trouble breathing. °The feeling of a full bladder and you are unable to urinate. °These symptoms may represent a serious problem that is an emergency. Do not wait to see if the symptoms will go away. Get medical help right away. Call your local emergency services (911 in the [place] °Summary °Ureteroscopy is a procedure to check for and treat problems inside part of the urinary tract. °In this procedure, a thin, flexible tube with a light at the end (ureteroscope) is used to look at the inside of the kidneys and the ureters. °You may need this procedure if you have frequent urinary tract infections (UTIs), blood in your urine, or a stone in a ureter. °This information is not intended to replace advice given to   you by your health care provider. Make sure you discuss any questions you have with your health care provider. °Document Revised: 03/31/2021 Document Reviewed: 04/24/2019 °Elsevier Patient Education © 2022 [employer] ° °

## 2021-06-09 NOTE — Progress Notes (Signed)
Surgical Physician Order Form  * Scheduling expectation : Next Available  *Length of Case:   *Clearance needed: no  *Anticoagulation Instructions: Hold all anticoagulants  *Aspirin Instructions: Ok to continue Aspirin  *Post-op visit Date/Instructions:  1 month with [facility] prior  *Diagnosis: Left Nephrolithiasis  *Procedure: Ureteroscopy w/laser lithotripsy & stent placement/exchange (86282)  -Admit type: OUTpatient  -Anesthesia: General  -VTE Prophylaxis Standing Order SCD's       Other:   -Standing Lab Orders Per Anesthesia    Lab other: UA&Urine Culture  -Standing Test orders EKG/Chest x-ray per Anesthesia       Test other:   - Medications:     Ancef 2gm IV   Other Instructions:

## 2021-06-10 ENCOUNTER — Encounter: Payer: Self-pay | Admitting: Urology

## 2021-06-11 NOTE — Progress Notes (Signed)
[facility] Posting Form   Surgery Date/Time: Date: 07/19/2021  Surgeon: Dr. Hollice Espy, MD  Surgery Location: Day Surgery  Inpt ( No  )   Outpt (Yes)   Obs ( No  )   Diagnosis: N20.0 Left Nephrolithiasis  -CPT: 56314  Surgery: Left URS/LL/Stent Placement  Stop Anticoagulations: Yes, May continue ASA  Cardiac/Medical/Pulmonary Clearance needed: No  *Orders entered into EPIC  Date: 06/11/21   *Case booked in [place]  Date: 06/10/2021  *Notified pt of Surgery: Date: 06/10/2021  PRE-OP UA & CX: yes  *Placed into Prior Authorization Work Que Date: 06/11/21   Assistant/laser/rep:No

## 2021-06-16 NOTE — Progress Notes (Signed)
PCP: [name], PA-C   Chief Complaint  Patient presents with   Gynecologic Exam    No concerns    HPI:      Ms. Nida Manfredi is a 50 y.o. G3P3000 whose LMP was No LMP recorded. Patient is postmenopausal., presents today for her annual examination.  Her menses are absent due to menopause; pt states LMP was in her 30s after IVF tx, never did HRT. No PMB. She has been having occas vasomotor sx recently but had gone yrs without any sx.  Sex activity: single partner, contraception - post menopausal status. She does have vaginal dryness and dyspareunia, not improved with lubricants. Has never done vag ERT.  Last Pap: ~3 yrs ago with home GYN; Results were: no abnormalities  per pt report. No hx of abn paps.   Last mammogram: 04/09/20 at [facility]: Results were: normal--routine follow-up in 12 months There is no FH of breast cancer. There is no FH of ovarian cancer. The patient does do self-breast exams.  Colonoscopy: 3/22 with [facility] with polyps; Repeat due after 7 years.  DEXA: ~6 yrs ago in [place], osteopenia per pt report  Tobacco use: The patient denies current or previous tobacco use. Alcohol use: social drinker No drug use Exercise: min active  She does get adequate calcium but not Vitamin D in her diet.  Labs with PCP.   Patient Active Problem List   Diagnosis Date Noted   Premature menopause 06/17/2021   Osteopenia 06/17/2021   TMJ (temporomandibular joint syndrome) 06/03/2021   Nasal congestion 06/03/2021   Mixed hyperlipidemia 06/03/2021   Memory changes 06/03/2021   Hair thinning 09/01/2020   Hypertension 09/01/2020   Hx of migraines 07/29/2020   Gastroesophageal reflux disease 07/29/2020   Colon cancer screening 07/29/2020   Vitamin D deficiency 07/29/2020   Lung nodule 07/29/2020   Rheumatoid arthritis with positive rheumatoid factor ([place]) 10/17/2017   Morbid obesity ([place]) 09/19/2017    Past Surgical History:  Procedure Laterality Date   ANKLE  SURGERY     AUGMENTATION MAMMAPLASTY     Breast Implant   [facility]   COLONOSCOPY WITH PROPOFOL N/A 10/01/2020   Procedure: COLONOSCOPY WITH PROPOFOL;  Surgeon: Lin Landsman, MD;  Location[facility] ENDOSCOPY;  Service: Gastroenterology;  Laterality: N/A;   COSMETIC SURGERY     ESOPHAGOGASTRODUODENOSCOPY (EGD) WITH PROPOFOL N/A 10/01/2020   Procedure: ESOPHAGOGASTRODUODENOSCOPY (EGD) WITH PROPOFOL;  Surgeon: Lin Landsman, MD;  Location: [facility];  Service: Gastroenterology;  Laterality: N/A;   HERNIA REPAIR     OTHER SURGICAL HISTORY     tummy tuck   OTHER SURGICAL HISTORY     carpal tunnel   TUBAL LIGATION      Family History  Problem Relation Age of Onset   Heart murmur Mother    Thyroid disease Daughter     Social History   Socioeconomic History   Marital status: Married    Spouse name: Not on file   Number of children: Not on file   Years of education: Not on file   Highest education level: Not on file  Occupational History   Not on file  Tobacco Use   Smoking status: Former    Packs/day: 0.50    Years: 21.00    Pack years: 10.50    Types: Cigarettes    Quit date: 03/29/2018    Years since quitting: 3.2   Smokeless tobacco: Never  Vaping Use   Vaping  Use: Never used  Substance and Sexual Activity   Alcohol use: Yes    Alcohol/week: 2.0 standard drinks    Types: 2 Glasses of wine per week   Drug use: Never   Sexual activity: Yes    Birth control/protection: Post-menopausal  Other Topics Concern   Not on file  Social History Narrative   ** Merged History Encounter **       Social Determinants of Health   Financial Resource Strain: Not on file  Food Insecurity: Not on file  Transportation Needs: Not on file  Physical Activity: Not on file  Stress: Not on file  Social Connections: Not on file  Intimate Partner Violence: Not on file     Current Outpatient Medications:    ALPRAZolam (XANAX) 0.25 MG tablet,  Take 1 tablet (0.25 mg total) by mouth 2 (two) times daily as needed for anxiety., Disp: 4 tablet, Rfl: 0   atorvastatin (LIPITOR) 10 MG tablet, Take 1 tablet (10 mg total) by mouth daily., Disp: 90 tablet, Rfl: 3   cyclobenzaprine (FLEXERIL) 5 MG tablet, Take 1 tablet (5 mg total) by mouth 3 (three) times daily as needed for muscle spasms., Disp: 30 tablet, Rfl: 1   estradiol (ESTRACE) 0.1 MG/GM vaginal cream, Insert 1g nightly for 1 wk, then 1 g once weekly as maintenace, Disp: 42.5 g, Rfl: 1   hydroxychloroquine (PLAQUENIL) 200 MG tablet, Take 200 mg by mouth daily., Disp: , Rfl:    Krill Oil 1000 MG CAPS, Take by mouth., Disp: , Rfl:    losartan (COZAAR) 50 MG tablet, Take 1 tablet (50 mg total) by mouth daily., Disp: 90 tablet, Rfl: 2   spironolactone (ALDACTONE) 25 MG tablet, Take 1 tablet (25 mg total) by mouth 2 (two) times daily., Disp: 180 tablet, Rfl: 0     ROS:  Review of Systems  Constitutional:  Negative for fatigue, fever and unexpected weight change.  Respiratory:  Negative for cough, shortness of breath and wheezing.   Cardiovascular:  Negative for chest pain, palpitations and leg swelling.  Gastrointestinal:  Negative for blood in stool, constipation, diarrhea, nausea and vomiting.  Endocrine: Negative for cold intolerance, heat intolerance and polyuria.  Genitourinary:  Positive for dyspareunia. Negative for dysuria, flank pain, frequency, genital sores, hematuria, menstrual problem, pelvic pain, urgency, vaginal bleeding, vaginal discharge and vaginal pain.  Musculoskeletal:  Negative for back pain, joint swelling and myalgias.  Skin:  Negative for rash.  Neurological:  Negative for dizziness, syncope, light-headedness, numbness and headaches.  Hematological:  Negative for adenopathy.  Psychiatric/Behavioral:  Negative for agitation, confusion, sleep disturbance and suicidal ideas. The patient is not nervous/anxious.   BREAST: No symptoms    Objective: BP 96/64    Ht 5\' 3"  (1.6 m)   Wt 186 lb (84.4 kg)   BMI 32.95 kg/m    Physical Exam Constitutional:      Appearance: She is well-developed.  Genitourinary:     Vulva normal.     Right Labia: No rash, tenderness or lesions.    Left Labia: No tenderness, lesions or rash.    No vaginal discharge, erythema or tenderness.     No vaginal atrophy present.     Right Adnexa: not tender and no mass present.    Left Adnexa: not tender and no mass present.    No cervical friability or polyp.     Uterus is not enlarged or tender.  Breasts:    Right: No mass, nipple discharge, skin change or tenderness.  Left: No mass, nipple discharge, skin change or tenderness.  Neck:     Thyroid: No thyromegaly.  Cardiovascular:     Rate and Rhythm: Normal rate and regular rhythm.     Heart sounds: Normal heart sounds. No murmur heard. Pulmonary:     Effort: Pulmonary effort is normal.     Breath sounds: Normal breath sounds.  Abdominal:     Palpations: Abdomen is soft.     Tenderness: There is no abdominal tenderness. There is no guarding or rebound.  Musculoskeletal:        General: Normal range of motion.     Cervical back: Normal range of motion.  Lymphadenopathy:     Cervical: No cervical adenopathy.  Neurological:     General: No focal deficit present.     Mental Status: She is alert and oriented to person, place, and time.     Cranial Nerves: No cranial nerve deficit.  Skin:    General: Skin is warm and dry.  Psychiatric:        Mood and Affect: Mood normal.        Behavior: Behavior normal.        Thought Content: Thought content normal.        Judgment: Judgment normal.  Vitals reviewed.    Assessment/Plan:  Encounter for annual routine gynecological examination  Cervical cancer screening - Plan: Cytology - PAP  Screening for HPV (human papillomavirus) - Plan: Cytology - PAP  Encounter for screening mammogram for malignant neoplasm of breast - Plan: MM 3D SCREEN BREAST BILATERAL; pt  to sched mammo  Dyspareunia in female - Plan: estradiol (ESTRACE) 0.1 MG/GM vaginal cream; try vag ERT. Rx estrace crm, cont lubricants. Vag tissue looks pretty healthy  Vaginal dryness, menopausal - Plan: estradiol (ESTRACE) 0.1 MG/GM vaginal cream  Screening for osteoporosis - Plan: DG Bone Density; check DEXA. Will f/u with results. Pt to sched with mamm[facility] at [facility]  Premature menopause - Plan: DG Bone Density  Osteopenia, unspecified location - Plan: DG Bone Density   Meds ordered this encounter  Medications   estradiol (ESTRACE) 0.1 MG/GM vaginal cream    Sig: Insert 1g nightly for 1 wk, then 1 g once weekly as maintenace    Dispense:  42.5 g    Refill:  1    Order Specific Question:   Supervising Provider    Answer:   Gae Dry [[id number]]            GYN counsel breast self exam, mammography screening, menopause, adequate intake of calcium and vitamin D, diet and exercise    F/U  Return in about 1 year (around 06/17/2022).  Coretha Creswell B. Jayleigh Notarianni, PA-C 06/17/2021 11:51 AM

## 2021-06-17 ENCOUNTER — Ambulatory Visit (INDEPENDENT_AMBULATORY_CARE_PROVIDER_SITE_OTHER): Payer: 59 | Admitting: Obstetrics and Gynecology

## 2021-06-17 ENCOUNTER — Encounter: Payer: Self-pay | Admitting: Obstetrics and Gynecology

## 2021-06-17 ENCOUNTER — Other Ambulatory Visit (HOSPITAL_COMMUNITY)
Admission: RE | Admit: 2021-06-17 | Discharge: 2021-06-17 | Disposition: A | Discharge status: Home / Self Care | Payer: 59 | Source: Ambulatory Visit | Attending: Obstetrics and Gynecology | Admitting: Obstetrics and Gynecology

## 2021-06-17 ENCOUNTER — Other Ambulatory Visit: Payer: Self-pay

## 2021-06-17 VITALS — BP 96/64 | Ht 63.0 in | Wt 186.0 lb

## 2021-06-17 DIAGNOSIS — Z124 Encounter for screening for malignant neoplasm of cervix: Secondary | ICD-10-CM | POA: Insufficient documentation

## 2021-06-17 DIAGNOSIS — Z1231 Encounter for screening mammogram for malignant neoplasm of breast: Secondary | ICD-10-CM

## 2021-06-17 DIAGNOSIS — Z1151 Encounter for screening for human papillomavirus (HPV): Secondary | ICD-10-CM

## 2021-06-17 DIAGNOSIS — Z01419 Encounter for gynecological examination (general) (routine) without abnormal findings: Secondary | ICD-10-CM | POA: Diagnosis not present

## 2021-06-17 DIAGNOSIS — M8580 Other specified disorders of bone density and structure, unspecified site: Secondary | ICD-10-CM

## 2021-06-17 DIAGNOSIS — E28319 Asymptomatic premature menopause: Secondary | ICD-10-CM

## 2021-06-17 DIAGNOSIS — Z13820 Encounter for screening for osteoporosis: Secondary | ICD-10-CM

## 2021-06-17 DIAGNOSIS — N951 Menopausal and female climacteric states: Secondary | ICD-10-CM

## 2021-06-17 DIAGNOSIS — N941 Unspecified dyspareunia: Secondary | ICD-10-CM

## 2021-06-17 MED ORDER — ESTRADIOL 0.1 MG/GM VA CREA: TOPICAL_CREAM | VAGINAL | 1 refills | Status: DC

## 2021-06-17 NOTE — Patient Instructions (Signed)
I value your feedback and you entrusting us with your care. If you get a [facility] patient survey, I would appreciate you taking the time to let us know about your experience today. Thank you!   [facility]: [phone]  

## 2021-06-25 LAB — CYTOLOGY - PAP
Adequacy: ABSENT
Comment: NEGATIVE
Diagnosis: NEGATIVE
High risk HPV: NEGATIVE

## 2021-06-28 ENCOUNTER — Ambulatory Visit: Payer: 59 | Admitting: Family Medicine

## 2021-06-29 ENCOUNTER — Other Ambulatory Visit: Payer: Self-pay

## 2021-06-29 ENCOUNTER — Ambulatory Visit: Payer: 59 | Admitting: Physician Assistant

## 2021-06-29 ENCOUNTER — Encounter: Payer: Self-pay | Admitting: Physician Assistant

## 2021-06-29 DIAGNOSIS — I10 Essential (primary) hypertension: Secondary | ICD-10-CM

## 2021-06-29 MED ORDER — TIRZEPATIDE 2.5 MG/0.5ML ~~LOC~~ SOAJ: 2.5000 mg | SUBCUTANEOUS | 0 refills | Status: DC

## 2021-06-29 MED ORDER — TIRZEPATIDE 5 MG/0.5ML ~~LOC~~ SOAJ: 5.0000 mg | SUBCUTANEOUS | 1 refills | Status: DC

## 2021-06-29 NOTE — Assessment & Plan Note (Signed)
Stable and well controlled on medication-- losartan 50 mg daily, spironolactone 25 mg BID (for hair loss).  Normal BP in office today, pt aware to call if sxs of low bp-- increased fatigue, orthostatic dizziness, lightheadedness. Will f/u in 3 mo to see progression

## 2021-06-29 NOTE — Progress Notes (Signed)
Established patient visit   Patient: Joan Johnson   DOB: [birth year]   50 y.o. Female  MRN: [id number] Visit Date: 06/29/2021  Today's healthcare provider: [name], PA-C   Cc. HTN f/u, weight management  Subjective    HPI  Hypertension, follow-up  BP Readings from Last 3 Encounters:  06/29/21 112/90  06/17/21 96/64  06/09/21 112/78   Wt Readings from Last 3 Encounters:  06/29/21 192 lb 4.8 oz (87.2 kg)  06/17/21 186 lb (84.4 kg)  06/09/21 185 lb (83.9 kg)     She was last seen for hypertension 3 weeks ago.  BP at that visit was 124/80. Management since that visit includes continue current treatment (spironolactone 25MG and losartan 50MG). Reports having a low blood pressure when she went to the OBGYN which made her nervous-- 96/64. Denies symptoms of dizziness, increased fatigue.  She reports good compliance with treatment. She is not having side effects.  She is following a Low Sodium diet. She is not exercising. She does not smoke.  Outside blood pressures are none. Symptoms: No chest pain No chest pressure  No palpitations No syncope  No dyspnea No orthopnea  No paroxysmal nocturnal dyspnea No lower extremity edema   Pertinent labs: Lab Results  Component Value Date   CHOL 223 (H) 06/08/2021   HDL 47 06/08/2021   LDLCALC 156 (H) 06/08/2021   TRIG 113 06/08/2021   CHOLHDL 4.7 (H) 06/08/2021   Lab Results  Component Value Date   NA 142 06/08/2021   K 5.4 (H) 06/08/2021   CREATININE 0.76 06/08/2021   EGFR 95 06/08/2021   GLUCOSE 83 06/08/2021   TSH 1.380 07/30/2020     The 10-year ASCVD risk score (Arnett DK, et al., 2019) is: 1.9%   ---------------------------------------------------------------------------------------- Patient has questions for weight loss management.  She was on [facility] when  she used to live in [place] for about a month and liked her experience.  She is discussed trying diet and exercise previously and feels like  she needs to kick start on her weight loss.     Medications: Outpatient Medications Prior to Visit  Medication Sig   ALPRAZolam (XANAX) 0.25 MG tablet Take 1 tablet (0.25 mg total) by mouth 2 (two) times daily as needed for anxiety.   atorvastatin (LIPITOR) 10 MG tablet Take 1 tablet (10 mg total) by mouth daily.   cyclobenzaprine (FLEXERIL) 5 MG tablet Take 1 tablet (5 mg total) by mouth 3 (three) times daily as needed for muscle spasms.   estradiol (ESTRACE) 0.1 MG/GM vaginal cream Insert 1g nightly for 1 wk, then 1 g once weekly as maintenace   hydroxychloroquine (PLAQUENIL) 200 MG tablet Take 200 mg by mouth daily.   Krill Oil 1000 MG CAPS Take by mouth.   losartan (COZAAR) 50 MG tablet Take 1 tablet (50 mg total) by mouth daily.   spironolactone (ALDACTONE) 25 MG tablet Take 1 tablet (25 mg total) by mouth 2 (two) times daily.   No facility-administered medications prior to visit.    Review of Systems  Constitutional:  Negative for fatigue and fever.  Respiratory:  Negative for cough and shortness of breath.   Cardiovascular:  Negative for chest pain.  Neurological:  Negative for light-headedness.  All other systems reviewed and are negative.  Last CBC Lab Results  Component Value Date   WBC 6.8 04/17/2021   HGB 13.8 04/17/2021   HCT 40.0 04/17/2021   MCV 92.0 04/17/2021   MCH 31.7  04/17/2021   RDW 12.9 04/17/2021   PLT 271 28/00/3491   Last metabolic panel Lab Results  Component Value Date   GLUCOSE 83 06/08/2021   NA 142 06/08/2021   K 5.4 (H) 06/08/2021   CL 106 06/08/2021   CO2 25 06/08/2021   BUN 15 06/08/2021   CREATININE 0.76 06/08/2021   EGFR 95 06/08/2021   CALCIUM 9.5 06/08/2021   PROT 6.4 06/08/2021   ALBUMIN 4.2 06/08/2021   LABGLOB 2.2 06/08/2021   AGRATIO 1.9 06/08/2021   BILITOT 1.0 06/08/2021   ALKPHOS 58 06/08/2021   AST 16 06/08/2021   ALT 17 06/08/2021   ANIONGAP 11 04/17/2021   Last lipids Lab Results  Component Value Date   CHOL  223 (H) 06/08/2021   HDL 47 06/08/2021   LDLCALC 156 (H) 06/08/2021   TRIG 113 06/08/2021   CHOLHDL 4.7 (H) 06/08/2021   Last hemoglobin A1c Lab Results  Component Value Date   HGBA1C 5.1 06/08/2021   Last thyroid functions Lab Results  Component Value Date   TSH 1.380 07/30/2020   Last vitamin D Lab Results  Component Value Date   VD25OH 36.1 10/08/2020   Last vitamin B12 and Folate Lab Results  Component Value Date   VITAMINB12 362 07/30/2020       Objective    BP 112/90 (BP Location: Left Arm, Patient Position: Sitting, Cuff Size: Large)   Pulse 95   Temp 98.4 F (36.9 C) (Oral)   Ht '5\' 3"'  (1.6 m)   Wt 192 lb 4.8 oz (87.2 kg)   SpO2 100%   BMI 34.06 kg/m  BP Readings from Last 3 Encounters:  06/29/21 112/90  06/17/21 96/64  06/09/21 112/78   Wt Readings from Last 3 Encounters:  06/29/21 192 lb 4.8 oz (87.2 kg)  06/17/21 186 lb (84.4 kg)  06/09/21 185 lb (83.9 kg)      Physical Exam Constitutional:      Appearance: Normal appearance. She is not ill-appearing.  Cardiovascular:     Rate and Rhythm: Normal rate.  Pulmonary:     Effort: Pulmonary effort is normal.  Neurological:     Mental Status: She is alert and oriented to person, place, and time.  Psychiatric:        Speech: Speech is rapid and pressured and tangential.        Behavior: Behavior is cooperative.        Thought Content: Thought content normal.    No results found for any visits on 06/29/21.  Assessment & Plan     Problem List Items Addressed This Visit       Cardiovascular and Mediastinum   Hypertension    Stable and well controlled on medication-- losartan 50 mg daily, spironolactone 25 mg BID (for hair loss).  Normal BP in office today, pt aware to call if sxs of low bp-- increased fatigue, orthostatic dizziness, lightheadedness. Will f/u in 3 mo to see progression        Other   Morbid obesity ([place]) - Primary    Discussed different forms of medical weight loss  management, including injectables. Pt was on saxenda previously but disliked how she felt, hx of taking Mounjaro without side effects. Described in detail how to take [facility], rx. Start with 2.5 mg subq injection once weekly x 4 weeks and then increase to 5 mg subq injection once weekly x 4 week. Advised if expensive to check website for copay card. She had questions about the black box  warning of thyroid cancer-- reassured and explained. Pt aware to call if side effects.  Discussed these medications work most appropriately when combined with a balanced diet and increased exercise.  Will f/u 3 mo.       Relevant Medications   tirzepatide [facility]) 2.5 MG/0.5ML Pen   tirzepatide (MOUNJARO) 5 MG/0.5ML Pen     Return in about 3 months (around 09/28/2021) for weight Management, hypertension.      I, [name], PA-C have reviewed all documentation for this visit. The documentation on  06/29/2021 for the exam, diagnosis, procedures, and orders are all accurate and complete.    [name], PA-C  [facility] [phone] (phone) [phone] (fax)  [place]

## 2021-06-29 NOTE — Assessment & Plan Note (Addendum)
Discussed different forms of medical weight loss management, including injectables. Pt was on saxenda previously but disliked how she felt, hx of taking Mounjaro without side effects. Described in detail how to take [facility], rx. Start with 2.5 mg subq injection once weekly x 4 weeks and then increase to 5 mg subq injection once weekly x 4 week. Advised if expensive to check website for copay card. She had questions about the black box warning of thyroid cancer-- reassured and explained. Pt aware to call if side effects.  Discussed these medications work most appropriately when combined with a balanced diet and increased exercise.  Will f/u 3 mo.

## 2021-07-01 ENCOUNTER — Encounter: Payer: Self-pay | Admitting: Obstetrics and Gynecology

## 2021-07-02 ENCOUNTER — Encounter: Payer: Self-pay | Admitting: Obstetrics and Gynecology

## 2021-07-06 ENCOUNTER — Other Ambulatory Visit
Admission: RE | Admit: 2021-07-06 | Discharge: 2021-07-06 | Disposition: A | Discharge status: Home / Self Care | Payer: 59 | Source: Ambulatory Visit | Attending: Urology | Admitting: Urology

## 2021-07-06 ENCOUNTER — Other Ambulatory Visit: Payer: Self-pay

## 2021-07-06 DIAGNOSIS — I10 Essential (primary) hypertension: Secondary | ICD-10-CM

## 2021-07-06 HISTORY — DX: Anxiety disorder, unspecified: F41.9

## 2021-07-06 NOTE — Patient Instructions (Addendum)
Your procedure is scheduled on: 07/19/21 - Monday Report to the Registration Desk on the 1st floor of the [facility]. To find out your arrival time, please call [phone] between 1PM - 3PM on: 07/16/21 - Friday  Report to the [facility] for labs/Ekg on 07/07/21 - at 20 am.  REMEMBER: Instructions that are not followed completely may result in serious medical risk, up to and including death; or upon the discretion of your surgeon and anesthesiologist your surgery may need to be rescheduled.  Do not eat food or drink any fluids after midnight the night before surgery.  No gum chewing, lozengers or hard candies.   TAKE THESE MEDICATIONS THE MORNING OF SURGERY WITH A SIP OF WATER:  - acetaminophen (TYLENOL) 500 MG tablet if needed. - ALPRAZolam (XANAX) 0.25 MG tablet if needed.   One week prior to surgery: Stop Anti-inflammatories (NSAIDS) such as Advil, Aleve, Ibuprofen, Motrin, Naproxen, Naprosyn and Aspirin based products such as Excedrin, Goodys Powder, BC Powder.  Stop ANY OVER THE COUNTER supplements until after surgery.  You may however, continue to take Tylenol if needed for pain up until the day of surgery.  No Alcohol for 24 hours before or after surgery.  No Smoking including e-cigarettes for 24 hours prior to surgery.  No chewable tobacco products for at least 6 hours prior to surgery.  No nicotine patches on the day of surgery.  Do not use any "recreational" drugs for at least a week prior to your surgery.  Please be advised that the combination of cocaine and anesthesia may have negative outcomes, up to and including death. If you test positive for cocaine, your surgery will be cancelled.  On the morning of surgery brush your teeth with toothpaste and water, you may rinse your mouth with mouthwash if you wish. Do not swallow any toothpaste or mouthwash.  Take shower/bath the morning of your procedure , you may apply deodorant.  Do not wear jewelry,  make-up, hairpins, clips or nail polish.  Do not wear lotions, powders, or perfumes.   Do not shave body from the neck down 48 hours prior to surgery just in case you cut yourself which could leave a site for infection.  Also, freshly shaved skin may become irritated if using the CHG soap.  Contact lenses, hearing aids and dentures may not be worn into surgery.  Do not bring valuables to the hospital. [facility] is not responsible for any missing/lost belongings or valuables.   Notify your doctor if there is any change in your medical condition (cold, fever, infection).  Wear comfortable clothing (specific to your surgery type) to the hospital.  After surgery, you can help prevent lung complications by doing breathing exercises.  Take deep breaths and cough every 1-2 hours. Your doctor may order a device called an Incentive Spirometer to help you take deep breaths. When coughing or sneezing, hold a pillow firmly against your incision with both hands. This is called "splinting." Doing this helps protect your incision. It also decreases belly discomfort.  If you are being admitted to the hospital overnight, leave your suitcase in the car. After surgery it may be brought to your room.  If you are being discharged the day of surgery, you will not be allowed to drive home. You will need a responsible adult (18 years or older) to drive you home and stay with you that night.   If you are taking public transportation, you will need to have a responsible  adult (18 years or older) with you. Please confirm with your physician that it is acceptable to use public transportation.   Please call the [facility] Dept. at [phone] if you have any questions about these instructions.  Surgery Visitation Policy:  Patients undergoing a surgery or procedure may have one family member or support person with them as long as that person is not COVID-19 positive or experiencing its symptoms.   That person may remain in the waiting area during the procedure and may rotate out with other people.  Inpatient Visitation:    Visiting hours are 7 a.m. to 8 p.m. Up to two visitors ages 16+ are allowed at one time in a patient room. The visitors may rotate out with other people during the day. Visitors must check out when they leave, or other visitors will not be allowed. One designated support person may remain overnight. The visitor must pass COVID-19 screenings, use hand sanitizer when entering and exiting the patient's room and wear a mask at all times, including in the patient's room. Patients must also wear a mask when staff or their visitor are in the room. Masking is required regardless of vaccination status.

## 2021-07-07 ENCOUNTER — Encounter
Admission: RE | Admit: 2021-07-07 | Discharge: 2021-07-07 | Disposition: A | Discharge status: Home / Self Care | Payer: 59 | Source: Ambulatory Visit | Attending: Urology | Admitting: Urology

## 2021-07-07 ENCOUNTER — Encounter: Payer: Self-pay | Admitting: Urgent Care

## 2021-07-07 ENCOUNTER — Other Ambulatory Visit: Admission: RE | Admit: 2021-07-07 | Payer: 59 | Source: Ambulatory Visit

## 2021-07-07 DIAGNOSIS — I10 Essential (primary) hypertension: Secondary | ICD-10-CM | POA: Diagnosis present

## 2021-07-07 LAB — CBC
HCT: 40.4 % (ref 36.0–46.0)
Hemoglobin: 13.6 g/dL (ref 12.0–15.0)
MCH: 31.6 pg (ref 26.0–34.0)
MCHC: 33.7 g/dL (ref 30.0–36.0)
MCV: 93.7 fL (ref 80.0–100.0)
Platelets: 285 10*3/uL (ref 150–400)
RBC: 4.31 MIL/uL (ref 3.87–5.11)
RDW: 12.5 % (ref 11.5–15.5)
WBC: 6.1 10*3/uL (ref 4.0–10.5)
nRBC: 0 % (ref 0.0–0.2)

## 2021-07-07 LAB — BASIC METABOLIC PANEL
Anion gap: 6 (ref 5–15)
BUN: 12 mg/dL (ref 6–20)
CO2: 28 mmol/L (ref 22–32)
Calcium: 9.5 mg/dL (ref 8.9–10.3)
Chloride: 104 mmol/L (ref 98–111)
Creatinine, Ser: 0.61 mg/dL (ref 0.44–1.00)
GFR, Estimated: >60 mL/min (ref >60)
Glucose, Bld: 119 mg/dL — ABNORMAL HIGH (ref 70–99)
Potassium: 3.8 mmol/L (ref 3.5–5.1)
Sodium: 138 mmol/L (ref 135–145)

## 2021-07-08 ENCOUNTER — Telehealth: Payer: Self-pay

## 2021-07-08 ENCOUNTER — Ambulatory Visit: Payer: Self-pay | Admitting: *Deleted

## 2021-07-08 NOTE — Telephone Encounter (Signed)
Copied from [place] [id number]. Topic: General - Other >> Jul 08, 2021  8:36 AM Tessa Lerner A wrote: Reason for CRM: The patient has called to share that they still have not heard from their insurance provider about their approval status for coverage of their insulin   Please contact the patient further if needed

## 2021-07-08 NOTE — Telephone Encounter (Signed)
Called pt informed her that since her provider here did not prescribe or inform her to take the medication that she would need to follow up with her GYN to see what they say. Pt verbalized understanding.  KP

## 2021-07-08 NOTE — Telephone Encounter (Signed)
Lightheaded and blurred vision approximately 1-2 hours after 2nd daily dose of Calcium carbonate 600 mg tabs prescribed by GYN b/c mild bone loss.    Chief Complaint: She would like to know if she can stop taking the Calcium.  Symptoms: Started taking Calcium Carbonate 600 mg twice daily 2 days ago and after evening dose taken along with Vit D3 2000u she experienced above symptoms.  Frequency: Does not know how long the symptoms last as she falls asleep feeling this way.  Pertinent Negatives: Patient denies all other symptoms. Disposition: [] ED /[] Urgent Care (no appt availability in office) / [] Appointment(In office/virtual)/ []  [facility]/ [] Home Care/ [x] Refused Recommended Disposition  Additional Notes:  Encouraged patient to consult with the prescribing provider regarding symptoms.  She will call GYN. She would like pcp's thoughts on if she even needs to take the Calcium and Vit D.  Routing to provider for advice.   Reason for Disposition . [1] Caller has medicine question about med NOT prescribed by PCP AND [2] triager unable to answer question (e.g., compatibility with other med, storage)  Answer Assessment - Initial Assessment Questions 1. NAME of MEDICATION: "What medicine are you calling about?"     Calcium Carbonate 600 mg  2. QUESTION: "What is your question?" (e.g., double dose of medicine, side effect)     Do I need this medication? See recent lab results 3. PRESCRIBING HCP: "Who prescribed it?" Reason: if prescribed by specialist, call should be referred to that group.     GYN 4. SYMPTOMS: "Do you have any symptoms?"     Lightheaded and blurred vision after 2 nd daily dose for 2 days now 5. SEVERITY: If symptoms are present, ask "Are they mild, moderate or severe?"     moderate 6. PREGNANCY:  "Is there any chance that you are pregnant?" "When was your last menstrual period?"     na  Protocols used: Medication Question Call-A-AH

## 2021-07-09 ENCOUNTER — Other Ambulatory Visit: Payer: Self-pay

## 2021-07-09 ENCOUNTER — Other Ambulatory Visit: Payer: 59

## 2021-07-09 DIAGNOSIS — N200 Calculus of kidney: Secondary | ICD-10-CM

## 2021-07-09 LAB — URINALYSIS, COMPLETE
Bilirubin, UA: NEGATIVE
Glucose, UA: NEGATIVE
Ketones, UA: NEGATIVE
Leukocytes,UA: NEGATIVE
Nitrite, UA: NEGATIVE
Protein,UA: NEGATIVE
RBC, UA: NEGATIVE
Specific Gravity, UA: 1.015 (ref 1.005–1.030)
Urobilinogen, Ur: 1 mg/dL (ref 0.2–1.0)
pH, UA: 7 (ref 5.0–7.5)

## 2021-07-09 LAB — MICROSCOPIC EXAMINATION: RBC, Urine: NONE SEEN /hpf (ref 0–2)

## 2021-07-09 NOTE — Telephone Encounter (Signed)
PA was started.

## 2021-07-13 LAB — CULTURE, URINE COMPREHENSIVE

## 2021-07-14 ENCOUNTER — Encounter: Payer: Self-pay | Admitting: Obstetrics and Gynecology

## 2021-07-19 ENCOUNTER — Encounter
Admission: RE | Disposition: A | Discharge status: Home / Self Care | Payer: Self-pay | Source: Home / Self Care | Attending: Urology

## 2021-07-19 ENCOUNTER — Ambulatory Visit: Payer: 59 | Admitting: Certified Registered"

## 2021-07-19 ENCOUNTER — Ambulatory Visit: Payer: 59

## 2021-07-19 ENCOUNTER — Ambulatory Visit
Admission: RE | Admit: 2021-07-19 | Discharge: 2021-07-19 | Disposition: A | Discharge status: Home / Self Care | Payer: 59 | Attending: Urology | Admitting: Urology

## 2021-07-19 ENCOUNTER — Telehealth: Payer: Self-pay | Admitting: Physician Assistant

## 2021-07-19 ENCOUNTER — Encounter: Payer: Self-pay | Admitting: Urology

## 2021-07-19 ENCOUNTER — Other Ambulatory Visit: Payer: Self-pay

## 2021-07-19 DIAGNOSIS — Z87442 Personal history of urinary calculi: Secondary | ICD-10-CM | POA: Diagnosis not present

## 2021-07-19 DIAGNOSIS — Z79899 Other long term (current) drug therapy: Secondary | ICD-10-CM | POA: Diagnosis not present

## 2021-07-19 DIAGNOSIS — I10 Essential (primary) hypertension: Secondary | ICD-10-CM | POA: Insufficient documentation

## 2021-07-19 DIAGNOSIS — Z87891 Personal history of nicotine dependence: Secondary | ICD-10-CM | POA: Diagnosis not present

## 2021-07-19 DIAGNOSIS — N1330 Unspecified hydronephrosis: Secondary | ICD-10-CM | POA: Diagnosis not present

## 2021-07-19 DIAGNOSIS — N200 Calculus of kidney: Secondary | ICD-10-CM | POA: Diagnosis present

## 2021-07-19 DIAGNOSIS — N2889 Other specified disorders of kidney and ureter: Secondary | ICD-10-CM | POA: Diagnosis not present

## 2021-07-19 HISTORY — PX: CYSTOSCOPY/URETEROSCOPY/HOLMIUM LASER/STENT PLACEMENT: SHX6546

## 2021-07-19 HISTORY — PX: CYSTOSCOPY W/ RETROGRADES: SHX1426

## 2021-07-19 SURGERY — CYSTOSCOPY/URETEROSCOPY/HOLMIUM LASER/STENT PLACEMENT
Anesthesia: General | Laterality: Left

## 2021-07-19 MED ORDER — ACETAMINOPHEN 10 MG/ML IV SOLN
INTRAVENOUS | Status: AC
  Filled 2021-07-19: qty 100

## 2021-07-19 MED ORDER — OXYBUTYNIN CHLORIDE 5 MG PO TABS: 5.0000 mg | ORAL_TABLET | Freq: Three times a day (TID) | ORAL | 0 refills | Status: DC | PRN

## 2021-07-19 MED ORDER — SUGAMMADEX SODIUM 500 MG/5ML IV SOLN
INTRAVENOUS | Status: DC | PRN
  Administered 2021-07-19: 300 mg via INTRAVENOUS

## 2021-07-19 MED ORDER — CEFAZOLIN SODIUM-DEXTROSE 2-4 GM/100ML-% IV SOLN
2.0000 g | INTRAVENOUS | Status: AC
  Administered 2021-07-19: 13:00:00 2 g via INTRAVENOUS

## 2021-07-19 MED ORDER — IOHEXOL 180 MG/ML  SOLN
INTRAMUSCULAR | Status: DC | PRN
  Administered 2021-07-19: 13:00:00 10 mL

## 2021-07-19 MED ORDER — ROCURONIUM BROMIDE 100 MG/10ML IV SOLN
INTRAVENOUS | Status: DC | PRN
  Administered 2021-07-19: 50 mg via INTRAVENOUS

## 2021-07-19 MED ORDER — FENTANYL CITRATE (PF) 100 MCG/2ML IJ SOLN
INTRAMUSCULAR | Status: DC | PRN
  Administered 2021-07-19: 50 ug via INTRAVENOUS

## 2021-07-19 MED ORDER — ONDANSETRON HCL 4 MG/2ML IJ SOLN
INTRAMUSCULAR | Status: DC | PRN
  Administered 2021-07-19 (×2): 4 mg via INTRAVENOUS

## 2021-07-19 MED ORDER — GLYCOPYRROLATE 0.2 MG/ML IJ SOLN
INTRAMUSCULAR | Status: DC | PRN
  Administered 2021-07-19: .2 mg via INTRAVENOUS

## 2021-07-19 MED ORDER — ACETAMINOPHEN 10 MG/ML IV SOLN
INTRAVENOUS | Status: DC | PRN
  Administered 2021-07-19: 1000 mg via INTRAVENOUS

## 2021-07-19 MED ORDER — KETOROLAC TROMETHAMINE 30 MG/ML IJ SOLN
INTRAMUSCULAR | Status: DC | PRN
  Administered 2021-07-19: 30 mg via INTRAVENOUS

## 2021-07-19 MED ORDER — LACTATED RINGERS IV SOLN: INTRAVENOUS | Status: DC

## 2021-07-19 MED ORDER — FAMOTIDINE 20 MG PO TABS: 20.0000 mg | ORAL_TABLET | Freq: Once | ORAL | Status: AC

## 2021-07-19 MED ORDER — TAMSULOSIN HCL 0.4 MG PO CAPS: 0.4000 mg | ORAL_CAPSULE | Freq: Every day | ORAL | 0 refills | Status: DC

## 2021-07-19 MED ORDER — ORAL CARE MOUTH RINSE: 15.0000 mL | Freq: Once | OROMUCOSAL | Status: AC

## 2021-07-19 MED ORDER — FENTANYL CITRATE (PF) 100 MCG/2ML IJ SOLN
INTRAMUSCULAR | Status: AC
  Filled 2021-07-19: qty 2

## 2021-07-19 MED ORDER — FENTANYL CITRATE (PF) 100 MCG/2ML IJ SOLN
25.0000 ug | INTRAMUSCULAR | Status: DC | PRN
  Administered 2021-07-19 (×3): 25 ug via INTRAVENOUS

## 2021-07-19 MED ORDER — ONDANSETRON HCL 4 MG/2ML IJ SOLN
INTRAMUSCULAR | Status: AC
  Filled 2021-07-19: qty 2

## 2021-07-19 MED ORDER — PROPOFOL 10 MG/ML IV BOLUS
INTRAVENOUS | Status: DC | PRN
  Administered 2021-07-19: 150 mg via INTRAVENOUS

## 2021-07-19 MED ORDER — CHLORHEXIDINE GLUCONATE 0.12 % MT SOLN: 15.0000 mL | Freq: Once | OROMUCOSAL | Status: AC

## 2021-07-19 MED ORDER — HYDROCODONE-ACETAMINOPHEN 5-325 MG PO TABS
1.0000 | ORAL_TABLET | Freq: Four times a day (QID) | ORAL | 0 refills | Status: DC | PRN
Start: 2021-07-19 — End: 2021-09-22

## 2021-07-19 MED ORDER — FAMOTIDINE 20 MG PO TABS
ORAL_TABLET | ORAL | Status: AC
  Administered 2021-07-19: 10:00:00 20 mg via ORAL
  Filled 2021-07-19: qty 1

## 2021-07-19 MED ORDER — ONDANSETRON HCL 4 MG/2ML IJ SOLN
4.0000 mg | Freq: Once | INTRAMUSCULAR | Status: AC | PRN
  Administered 2021-07-19: 15:00:00 4 mg via INTRAVENOUS

## 2021-07-19 MED ORDER — LIDOCAINE HCL (CARDIAC) PF 100 MG/5ML IV SOSY
PREFILLED_SYRINGE | INTRAVENOUS | Status: DC | PRN
  Administered 2021-07-19: 100 mg via INTRAVENOUS

## 2021-07-19 MED ORDER — FENTANYL CITRATE (PF) 100 MCG/2ML IJ SOLN
INTRAMUSCULAR | Status: AC
  Administered 2021-07-19: 14:00:00 25 ug via INTRAVENOUS
  Filled 2021-07-19: qty 2

## 2021-07-19 MED ORDER — CEFAZOLIN SODIUM-DEXTROSE 2-4 GM/100ML-% IV SOLN
INTRAVENOUS | Status: AC
  Filled 2021-07-19: qty 100

## 2021-07-19 MED ORDER — MIDAZOLAM HCL 2 MG/2ML IJ SOLN
INTRAMUSCULAR | Status: AC
  Filled 2021-07-19: qty 2

## 2021-07-19 MED ORDER — CHLORHEXIDINE GLUCONATE 0.12 % MT SOLN
OROMUCOSAL | Status: AC
  Administered 2021-07-19: 10:00:00 15 mL via OROMUCOSAL
  Filled 2021-07-19: qty 15

## 2021-07-19 MED ORDER — MIDAZOLAM HCL 2 MG/2ML IJ SOLN
INTRAMUSCULAR | Status: DC | PRN
  Administered 2021-07-19: 2 mg via INTRAVENOUS

## 2021-07-19 MED ORDER — DEXAMETHASONE SODIUM PHOSPHATE 10 MG/ML IJ SOLN
INTRAMUSCULAR | Status: DC | PRN
  Administered 2021-07-19: 10 mg via INTRAVENOUS

## 2021-07-19 SURGICAL SUPPLY — 35 items
ADH LQ OCL WTPRF AMP STRL LF (MISCELLANEOUS) ×1
ADHESIVE MASTISOL STRL (MISCELLANEOUS) ×2 IMPLANT
BAG DRAIN CYSTO-URO LG1000N (MISCELLANEOUS) ×3 IMPLANT
BASKET ZERO TIP 1.9FR (BASKET) IMPLANT
BRUSH SCRUB 4% CHG (MISCELLANEOUS) ×2 IMPLANT
BRUSH SCRUB EZ 1% IODOPHOR (MISCELLANEOUS) ×1 IMPLANT
BSKT STON RTRVL ZERO TP 1.9FR (BASKET)
CATH URET FLEX-TIP 2 LUMEN 10F (CATHETERS) IMPLANT
CATH URETL OPEN 5X70 (CATHETERS) ×3 IMPLANT
CNTNR SPEC 2.5X3XGRAD LEK (MISCELLANEOUS)
CONT SPEC 4OZ STER OR WHT (MISCELLANEOUS)
CONT SPEC 4OZ STRL OR WHT (MISCELLANEOUS)
CONTAINER SPEC 2.5X3XGRAD LEK (MISCELLANEOUS) IMPLANT
DRAPE UTILITY 15X26 TOWEL STRL (DRAPES) ×3 IMPLANT
DRSG TEGADERM 2-3/8X2-3/4 SM (GAUZE/BANDAGES/DRESSINGS) ×2 IMPLANT
GAUZE 4X4 16PLY ~~LOC~~+RFID DBL (SPONGE) ×6 IMPLANT
GLOVE SURG ENC MOIS LTX SZ6.5 (GLOVE) ×3 IMPLANT
GOWN STRL REUS W/ TWL LRG LVL3 (GOWN DISPOSABLE) ×2 IMPLANT
GOWN STRL REUS W/TWL LRG LVL3 (GOWN DISPOSABLE) ×6
GUIDEWIRE GREEN .038 145CM (MISCELLANEOUS) IMPLANT
GUIDEWIRE STR DUAL SENSOR (WIRE) ×3 IMPLANT
INFUSOR MANOMETER BAG 3000ML (MISCELLANEOUS) ×3 IMPLANT
IV NS IRRIG 3000ML ARTHROMATIC (IV SOLUTION) ×3 IMPLANT
KIT TURNOVER CYSTO (KITS) ×3 IMPLANT
MANIFOLD NEPTUNE II (INSTRUMENTS) ×1 IMPLANT
PACK CYSTO AR (MISCELLANEOUS) ×3 IMPLANT
SET CYSTO W/LG BORE CLAMP LF (SET/KITS/TRAYS/PACK) ×3 IMPLANT
SHEATH URETERAL 12FRX35CM (MISCELLANEOUS) IMPLANT
STENT URET 6FRX24 CONTOUR (STENTS) ×2 IMPLANT
STENT URET 6FRX26 CONTOUR (STENTS) IMPLANT
SURGILUBE 2OZ TUBE FLIPTOP (MISCELLANEOUS) ×3 IMPLANT
TRACTIP FLEXIVA PULSE ID 200 (Laser) ×3 IMPLANT
WATER STERILE IRR 1000ML POUR (IV SOLUTION) ×3 IMPLANT
WATER STERILE IRR 500ML POUR (IV SOLUTION) ×3 IMPLANT
boston scientific navigator access sheath ×2 IMPLANT

## 2021-07-19 NOTE — Anesthesia Preprocedure Evaluation (Addendum)
Anesthesia Evaluation  Patient identified by MRN, date of birth, ID band Patient awake    Airway Mallampati: II       Dental  (+) Teeth Intact   Pulmonary neg pulmonary ROS, former smoker,    breath sounds clear to auscultation       Cardiovascular hypertension, Pt. on medications  Rhythm:Regular     Neuro/Psych  Headaches, Anxiety    GI/Hepatic Neg liver ROS, GERD  ,  Endo/Other  negative endocrine ROS  Renal/GU negative Renal ROS  negative genitourinary   Musculoskeletal   Abdominal Normal abdominal exam  (+)   Peds negative pediatric ROS (+)  Hematology negative hematology ROS (+)   Anesthesia Other Findings   Reproductive/Obstetrics                             Anesthesia Physical Anesthesia Plan  ASA: 2  Anesthesia Plan: General   Post-op Pain Management:    Induction: Intravenous  PONV Risk Score and Plan:   Airway Management Planned: LMA and Oral ETT  Additional Equipment:   Intra-op Plan:   Post-operative Plan:   Informed Consent: I have reviewed the patients History and Physical, chart, labs and discussed the procedure including the risks, benefits and alternatives for the proposed anesthesia with the patient or authorized representative who has indicated his/her understanding and acceptance.       Plan Discussed with: CRNA and Surgeon  Anesthesia Plan Comments:         Anesthesia Quick Evaluation

## 2021-07-19 NOTE — Op Note (Signed)
Date of procedure: 07/19/21  Preoperative diagnosis:  History of left distal ureteral calculus Left kidney stones  Postoperative diagnosis:  As above  Procedure: Left ureteroscopy with laser lithotripsy Left retrograde pyelogram Left ureteral stent placement Interpretation fluoroscopy less than 30 minutes  Surgeon: Hollice Espy, MD  Anesthesia: General  Complications: None  Intraoperative findings: Interval passage of left distal ureteral calculus.  Nonobstructing stone burden including Randall's plaques and left upper pole as well as extreme lower pole stone treated/dusted.  No significant residual stone fragments.  No extravasation.  Stent left on tether.  EBL: Minimal  Specimens: None  Drains: 6 x 24 French double-J ureteral stent on tether on left  Indication: Joan Johnson is a 50 y.o. patient with left distal ureteral calculus as well as some nonobstructing stone burden.  After reviewing the management options for treatment, she elected to proceed with the above surgical procedure(s). We have discussed the potential benefits and risks of the procedure, side effects of the proposed treatment, the likelihood of the patient achieving the goals of the procedure, and any potential problems that might occur during the procedure or recuperation. Informed consent has been obtained.  Description of procedure:  The patient was taken to the operating room and general anesthesia was induced.  The patient was placed in the dorsal lithotomy position, prepped and draped in the usual sterile fashion, and preoperative antibiotics were administered. A preoperative time-out was performed.   A 21 French cystoscope was advanced per urethra to the bladder.  Attention was turned to the left ureteral orifice.  On scout imaging, the distal ureteral calculus was not readily seen.  The left UO was intubated using a 5 [place] open-ended ureteral catheter and gentle retrograde pyelogram was performed  on the side which outlined the ureter no filling defects were appreciated.  A sensor wire was then placed up to the level of the kidney which went easily.  This is snapped in place as a safety wire.  Additional flexible ureteroscope was then brought in and advanced all the way up to the proximal ureter no stones or any ureteral injuries or fragments were appreciated.  I then advanced a Super Stiff wire through the scope up to the level of the kidney.  The scope was then removed.  A 11/13 French navigator access sheath was advanced over the working wire up to level the proximal ureter which went easily.  The wire and inner cannula were removed.  A dual digital channel 8 French ureteroscope was then brought in and advanced into the kidney.  The stone was identified in the extreme lower pole, approximately 4 mm as well as a Randall's plaque in the left upper pole.  I brought in 242 laser micron fiber and using settings of 0.3 J and 80 Hz, I started with the upper pole fragment/Randall's plaque.  Upon unroofing this, 3 additional stones were identified within the same pouch.  These are all fragmented and removed/dusted.  There is also a small embedded stone just lateral to this was also treated.  I then navigated the scope into the extreme lower pole where I dusted approximately 4 mm stone.  This was somewhat difficult to navigate but ultimately successful.  No residual stone fragments were appreciated.  Finally, contrast was injected again through the scope.  There is some mild hydronephrosis presumed from the irrigation under pressure but no extravasation was created roadmap to ensure that each every calyx was directly visualized and no additional stone fragment remaining.  I  then backed the scope down the length the ureter inspecting along the way.  There were no ureteral injuries or fragments identified.  Lastly, a 6 x 2 4 French double-J ureteral stent was advanced over the wire up to the level of the kidney.  Upon  removal of the wire, there is a full coil of the stent both within the renal pelvis as well as within the bladder.  The stent string was left affixed to the patient's left inner thigh using Mastisol and Tegaderm.  She was cleaned and dried, repositioned supine position, reversed myesthesia, taken to the PACU in stable condition.  Plan: She remove her own stent Thursday.  If she does not feel comfortable doing this, we can have our nursing staff/MAs remove it.  We will see her in 4 weeks with renal stone prior.  All questions were answered.  Hollice Espy, M.D.

## 2021-07-19 NOTE — Telephone Encounter (Signed)
[place] [facility] faxed refill request for the following medications:   tirzepatide [facility]) 2.5 MG/0.5ML Pen   Please advise.

## 2021-07-19 NOTE — Anesthesia Procedure Notes (Signed)
Procedure Name: Intubation Date/Time: 07/19/2021 12:57 PM Performed by: Kelton Pillar, CRNA Pre-anesthesia Checklist: Patient identified, Emergency Drugs available, Suction available and Patient being monitored Patient Re-evaluated:Patient Re-evaluated prior to induction Oxygen Delivery Method: Circle system utilized Preoxygenation: Pre-oxygenation with 100% oxygen Induction Type: IV induction Ventilation: Mask ventilation without difficulty Laryngoscope Size: McGraph and 3 Grade View: Grade I Tube type: Oral Tube size: 7.0 mm Number of attempts: 1 Airway Equipment and Method: Stylet and Oral airway Placement Confirmation: ETT inserted through vocal cords under direct vision, positive ETCO2, breath sounds checked- equal and bilateral and CO2 detector Secured at: 21 cm Tube secured with: Tape Dental Injury: Teeth and Oropharynx as per pre-operative assessment

## 2021-07-19 NOTE — Discharge Instructions (Addendum)
You have a ureteral stent in place.  This is a tube that extends from your kidney to your bladder.  This may cause urinary bleeding, burning with urination, and urinary frequency.  Please call our office or present to the ED if you develop fevers >101 or pain which is not able to be controlled with oral pain medications.  You may be given either Flomax and/ or ditropan to help with bladder spasms and stent pain in addition to pain medications.    Your stent is on a string which is taped to your left inner thigh.  On Thursday morning, you may untape this and pull the string gently until the entire stent is removed.  If he have any issues with this or feel uncomfortable removing it on your own, please schedule appointment for our staff to help remove.  [facility] [address] [place], [place] [address] [phone]     AMBULATORY SURGERY  DISCHARGE INSTRUCTIONS   The drugs that you were given will stay in your system until tomorrow so for the next 24 hours you should not:  Drive an automobile Make any legal decisions Drink any alcoholic beverage   You may resume regular meals tomorrow.  Today it is better to start with liquids and gradually work up to solid foods.  You may eat anything you prefer, but it is better to start with liquids, then soup and crackers, and gradually work up to solid foods.   Please notify your doctor immediately if you have any unusual bleeding, trouble breathing, redness and pain at the surgery site, drainage, fever, or pain not relieved by medication.    Additional Instructions:        Please contact your physician with any problems or Same Day Surgery at [phone], Monday through Friday 6 am to 4 pm, or [place] at [facility] number at [phone].

## 2021-07-19 NOTE — Transfer of Care (Signed)
Immediate Anesthesia Transfer of Care Note  Patient: Joan Johnson  Procedure(s) Performed: CYSTOSCOPY/URETEROSCOPY/HOLMIUM LASER/STENT PLACEMENT (Left) CYSTOSCOPY WITH RETROGRADE PYELOGRAM (Left)  Patient Location: PACU  Anesthesia Type:General  Level of Consciousness: awake, drowsy and patient cooperative  Airway & Oxygen Therapy: Patient Spontanous Breathing  Post-op Assessment: Report given to RN and Post -op Vital signs reviewed and stable  Post vital signs: Reviewed and stable  Last Vitals:  Vitals Value Taken Time  BP 113/65 07/19/21 1337  Temp 36.3 C 07/19/21 1337  Pulse 97 07/19/21 1338  Resp 14 07/19/21 1338  SpO2 100 % 07/19/21 1338  Vitals shown include unvalidated device data.  Last Pain:  Vitals:   07/19/21 1007  TempSrc: Temporal  PainSc: 0-No pain         Complications: No notable events documented.

## 2021-07-19 NOTE — Anesthesia Postprocedure Evaluation (Signed)
Anesthesia Post Note  Patient: Joan Johnson  Procedure(s) Performed: CYSTOSCOPY/URETEROSCOPY/HOLMIUM LASER/STENT PLACEMENT (Left) CYSTOSCOPY WITH RETROGRADE PYELOGRAM (Left)  Patient location during evaluation: PACU Anesthesia Type: General Level of consciousness: awake and alert, awake and oriented Pain management: pain level controlled Vital Signs Assessment: post-procedure vital signs reviewed and stable Respiratory status: spontaneous breathing, nonlabored ventilation and respiratory function stable Cardiovascular status: blood pressure returned to baseline and stable Postop Assessment: no apparent nausea or vomiting Anesthetic complications: no   No notable events documented.   Last Vitals:  Vitals:   07/19/21 1415 07/19/21 1423  BP: 106/73 112/75  Pulse: 74 81  Resp: 13 15  Temp: (!) 36.1 C (!) 36.2 C  SpO2: 97% 99%    Last Pain:  Vitals:   07/19/21 1423  TempSrc: Temporal  PainSc: 0-No pain                 Phill Mutter

## 2021-07-19 NOTE — H&P (Signed)
07/19/21  [facility] CTAB   Jersi Mcmaster 05-11-1971 [id number]   Referring provider:  Doreen Beam, FNP [address]  [place] [address]    Chief Complaint  Patient presents with   Nephrolithiasis        HPI: Joan Johnson is a 50 y.o.female with a personal history of left distal ureteral stone, left hydronephrosis, and flank pain, who presents today for 1 month follow-up.   CT abdomen and pelvis revealed adrenal glands are unremarkable. An obstructing 4 mm calculus is seen at the left ureterovesical junction resulting in moderate left hydroureteronephrosis. Multiple additional nonobstructing calculi are seen in the left kidney, measuring up to 6 mm. The right kidney is normal, without renal calculi, focal lesion, or hydronephrosis. The urinary bladder is nearly empty.   CT renal stone study on 04/21/2021 revealed obstructive 4 mm calculus at the left UVJ resulting in moderate left hydroureteronephrosis.   04/21/2021 KUB revealed left renal calculi, interval passage of left UVJ stone, and punctate phlebolith in the left hemipelvis versus likely a stone in the distal left ureter.    She underwent shockwave lithotripsy for a previous stone in the kidney for which she brings the operative report today, in 2018 in [place].   She returns today primarily to discuss management of her nonobstructing stones, multiple up to 6 to 7 mm all on the left.   PMH:     Past Medical History:  Diagnosis Date   Allergy     Arthritis     History of kidney stones     Hypertension     Migraine        Surgical History:      Past Surgical History:  Procedure Laterality Date   [place]   COLONOSCOPY WITH PROPOFOL N/A 10/01/2020    Procedure: COLONOSCOPY WITH PROPOFOL;  Surgeon: Lin Landsman, MD;  Location: [facility] END[facility];  Service: Gastroenterology;  Laterality:  N/A;   COSMETIC SURGERY       ESOPHAGOGASTRODUODENOSCOPY (EGD) WITH PROPOFOL N/A 10/01/2020    Procedure: ESOPHAGOGASTRODUODENOSCOPY (EGD) WITH PROPOFOL;  Surgeon: Lin Landsman, MD;  Location: [facility];  Service: Gastroenterology;  Laterality: N/A;   HERNIA REPAIR       OTHER SURGICAL HISTORY        tummy tuck   OTHER SURGICAL HISTORY        carpal tunnel   TUBAL LIGATION          Home Medications:  Allergies as of 06/09/2021         Reactions    Shellfish Allergy Hives            Medication List           Accurate as of June 09, 2021 11:03 AM. If you have any questions, ask your nurse or doctor.              ALPRAZolam 0.25 MG tablet Commonly known as: XANAX Take 1 tablet (0.25 mg total) by mouth 2 (two) times daily as needed for anxiety.    atorvastatin 10 MG tablet Commonly known as: LIPITOR Take 10 mg by mouth daily.    cyclobenzaprine 5 MG tablet Commonly known as: FLEXERIL Take 1 tablet (5 mg total) by mouth 3 (three) times daily as needed for muscle spasms.  hydroxychloroquine 200 MG tablet Commonly known as: PLAQUENIL Take 200 mg by mouth daily.    Krill Oil 1000 MG Caps Take by mouth.    losartan 50 MG tablet Commonly known as: COZAAR Take 1 tablet (50 mg total) by mouth daily.    naproxen 500 MG tablet Commonly known as: Naprosyn Take 1 tablet (500 mg total) by mouth 2 (two) times daily with a meal for 7 days.    spironolactone 25 MG tablet Commonly known as: ALDACTONE Take 1 tablet (25 mg total) by mouth 2 (two) times daily.             Allergies:      Allergies  Allergen Reactions   Shellfish Allergy Hives      Family History:      Family History  Problem Relation Age of Onset   Heart murmur Mother     Thyroid disease Daughter        Social History:  reports that she quit smoking about 3 years ago. Her smoking use included cigarettes. She has a 10.50 pack-year smoking history. She has never used smokeless  tobacco. She reports current alcohol use of about 2.0 standard drinks per week. She reports that she does not use drugs.     Physical Exam: BP 112/78    Pulse 92    Ht 5\' 2"  (1.575 m)    Wt 185 lb (83.9 kg)    BMI 33.84 kg/m   Constitutional:  Alert and oriented, No acute distress. HEENT: [place] AT, moist mucus membranes.  Trachea midline, no masses. Cardiovascular: No clubbing, cyanosis, or edema. Respiratory: Normal respiratory effort, no increased work of breathing. Skin: No rashes, bruises or suspicious lesions. Neurologic: Grossly intact, no focal deficits, moving all 4 extremities. Psychiatric: Normal mood and affect.   Laboratory Data:   Recent Labs       Lab Results  Component Value Date    CREATININE 0.76 06/08/2021        Assessment & Plan:     Left kidney stones    - We discussed various treatment options for urolithiasis including observation with or without medical expulsive therapy, shockwave lithotripsy (SWL), ureteroscopy and laser lithotripsy with stent placement, and percutaneous nephrolithotomy.   We discussed that management is based on stone size, location, density, patient co-morbidities, and patient preference.    We discussed given the multifocal nature of the stones in the left kidney, ureteroscopy would make most sense such that the entire stone burden could be treated with 1 procedure.   Ureteroscopy with laser lithotripsy and stent placement has a higher stone free rate than SWL in a single procedure, however increased complication rate including possible infection, ureteral injury, bleeding, and stent related morbidity. Common stent related symptoms include dysuria, urgency/frequency, and flank pain.   After an extensive discussion of the risks and benefits of the above treatment options, the patient would like to proceed with ureteroscopy.      Return for surgery will call to schedule.   I,Kailey Littlejohn,acting as a Education administrator for Hollice Espy,  MD.,have documented all relevant documentation on the behalf of Hollice Espy, MD,as directed by  Hollice Espy, MD while in the presence of Hollice Espy, MD.     I have reviewed the above documentation for accuracy and completeness, and I agree with the above.    Hollice Espy, MD     [facility] [address] [place], [place] [address] [phone]

## 2021-07-20 ENCOUNTER — Encounter: Payer: Self-pay | Admitting: Urology

## 2021-07-20 NOTE — Telephone Encounter (Signed)
Pt calling if stating that she called her insurance company and they're requesting a prior authorization for tirzepatide [facility]). She has [employer]. Pt number is [phone]

## 2021-07-20 NOTE — Telephone Encounter (Signed)
Patient called in to speak to [name] because her Rx was denied and she is requesting something else if possible for the weight loss. Please be advised call patient with a update at  Ph# [phone]

## 2021-07-22 ENCOUNTER — Encounter: Payer: Self-pay | Admitting: Physician Assistant

## 2021-07-22 ENCOUNTER — Ambulatory Visit: Payer: 59 | Admitting: Physician Assistant

## 2021-07-22 ENCOUNTER — Other Ambulatory Visit: Payer: Self-pay

## 2021-07-22 ENCOUNTER — Telehealth: Payer: Self-pay

## 2021-07-22 DIAGNOSIS — N200 Calculus of kidney: Secondary | ICD-10-CM

## 2021-07-22 NOTE — Progress Notes (Signed)
Patient came in for stent removal as she was uncomfortable removing it herself. Stent was on a tether and was removed with no complications by Helon Wisinski O`Sullivan,RMA. Patient tolerated well. Patient will keep follow up appointment as scheduled

## 2021-07-22 NOTE — Telephone Encounter (Signed)
Copied from [place] [id number]. Topic: General - Other >> Jul 22, 2021  3:04 PM Tessa Lerner A wrote: Reason for CRM: The patient has called for an update on their previously discussed prior authorization for tirzepatide [facility])   Please contact the patient further when possible  The patient stresses the urgency of their request

## 2021-08-16 ENCOUNTER — Telehealth: Payer: Self-pay | Admitting: *Deleted

## 2021-08-16 DIAGNOSIS — N200 Calculus of kidney: Secondary | ICD-10-CM

## 2021-08-16 NOTE — Telephone Encounter (Signed)
Spoke with patient, to have RUS completed. Placed order provided # for scheulding. She will call the office with her appointment to reschedule follow up.

## 2021-08-18 ENCOUNTER — Encounter: Payer: 59 | Admitting: Urology

## 2021-09-01 ENCOUNTER — Telehealth: Payer: Self-pay

## 2021-09-01 NOTE — Telephone Encounter (Signed)
Copied from [place] [id number]. Topic: General - Other >> Sep 01, 2021  2:14 PM Valere Dross wrote: Reason for CRM: Pt called in stating if PCP could write her a letter stating that her puppy can fly with her, do to her anxiety, please advise.

## 2021-09-02 NOTE — Telephone Encounter (Signed)
Advised 

## 2021-09-03 ENCOUNTER — Other Ambulatory Visit: Payer: Self-pay

## 2021-09-03 ENCOUNTER — Ambulatory Visit
Admission: RE | Admit: 2021-09-03 | Discharge: 2021-09-03 | Disposition: A | Discharge status: Home / Self Care | Payer: 59 | Source: Ambulatory Visit | Attending: Urology | Admitting: Urology

## 2021-09-03 DIAGNOSIS — N200 Calculus of kidney: Secondary | ICD-10-CM | POA: Insufficient documentation

## 2021-09-06 ENCOUNTER — Telehealth: Payer: Self-pay | Admitting: *Deleted

## 2021-09-06 NOTE — Telephone Encounter (Addendum)
Patient informed, voiced understanding. Scheduled follow up   ----- Message from Hollice Espy, MD sent at 09/06/2021 12:34 PM EST ----- Please make sure she is scheduled for f/u.  RUS looks fine.  Hollice Espy, MD

## 2021-09-07 ENCOUNTER — Emergency Department (HOSPITAL_BASED_OUTPATIENT_CLINIC_OR_DEPARTMENT_OTHER): Payer: No Typology Code available for payment source | Admitting: Radiology

## 2021-09-07 ENCOUNTER — Emergency Department (HOSPITAL_BASED_OUTPATIENT_CLINIC_OR_DEPARTMENT_OTHER)
Admission: EM | Admit: 2021-09-07 | Discharge: 2021-09-07 | Disposition: A | Discharge status: Home / Self Care | Payer: No Typology Code available for payment source | Attending: Emergency Medicine | Admitting: Emergency Medicine

## 2021-09-07 ENCOUNTER — Other Ambulatory Visit: Payer: Self-pay

## 2021-09-07 DIAGNOSIS — Y92410 Unspecified street and highway as the place of occurrence of the external cause: Secondary | ICD-10-CM | POA: Diagnosis not present

## 2021-09-07 DIAGNOSIS — Z79899 Other long term (current) drug therapy: Secondary | ICD-10-CM | POA: Diagnosis not present

## 2021-09-07 DIAGNOSIS — S39012A Strain of muscle, fascia and tendon of lower back, initial encounter: Secondary | ICD-10-CM

## 2021-09-07 DIAGNOSIS — M545 Low back pain, unspecified: Secondary | ICD-10-CM | POA: Diagnosis not present

## 2021-09-07 MED ORDER — METHOCARBAMOL 500 MG PO TABS: 500.0000 mg | ORAL_TABLET | Freq: Two times a day (BID) | ORAL | 0 refills | Status: DC

## 2021-09-07 MED ORDER — IBUPROFEN 800 MG PO TABS
800.0000 mg | ORAL_TABLET | Freq: Once | ORAL | Status: AC
Start: 2021-09-07 — End: 2021-09-07
  Administered 2021-09-07: 800 mg via ORAL
  Filled 2021-09-07: qty 1

## 2021-09-07 MED ORDER — ACETAMINOPHEN 325 MG PO TABS
650.0000 mg | ORAL_TABLET | Freq: Once | ORAL | Status: AC
  Administered 2021-09-07: 650 mg via ORAL
  Filled 2021-09-07: qty 2
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
## 2021-09-07 NOTE — ED Notes (Signed)
Patient transported to X-ray 
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
## 2021-09-07 NOTE — ED Notes (Signed)
EMT-P provided AVS using Teachback Method. Patient verbalizes understanding of Discharge Instructions. Opportunity for Questioning and Answers were provided by EMT-P. Patient Discharged from ED.  ? ?

## 2021-09-21 NOTE — Progress Notes (Signed)
09/22/21 11:20 AM   Joan Johnson May 05, 1971 [id number]  Referring provider:  [name], PA-C [address] [place],  [place] [address] Chief Complaint  Patient presents with   Nephrolithiasis     HPI: Joan Johnson is a 51 y.o.female with a personal history of left distal ureteral stone, left hydronephrosis, and flank pain, who presents today for 1 month post-op follow-up with RUS results.   She underwent shockwave lithotripsy for a previous stone in the kidney for which she brought the operative report, in 2018 in [place].  CT abdomen and pelvis on 04/17/2021 revealed adrenal glands are unremarkable. An obstructing 4 mm calculus is seen at the left ureterovesical junction resulting in moderate left hydroureteronephrosis. Multiple additional nonobstructing calculi are seen in the left kidney, measuring up to 6 mm. The right kidney is normal, without renal calculi, focal lesion, or hydronephrosis. The urinary bladder is nearly empty.   CT renal stone study on 04/21/2021 revealed obstructive 4 mm calculus at the left UVJ resulting in moderate left hydroureteronephrosis.  KUB revealed left renal calculi, interval passage of left UVJ stone, and punctate phlebolith in the left hemipelvis versus likely a stone in the distal left ureter.    She is s/p ureteroscopy on 07/19/2021.  Intraoperative findings: Interval passage of left distal ureteral calculus.  Nonobstructing stone burden including Randall's plaques and left upper pole as well as extreme lower pole stone treated/dusted.  No significant residual stone fragments.  No extravasation.  Stent left on tether. Stent was removed on 07/22/2021.   She is doing well today. She reports that she has had increased urinary frequency.  She denies any dysuria or gross hematuria.  No flank pain.  This is only mildly bothersome to her) improving.  PMH: Past Medical History:  Diagnosis Date   Allergy    Anxiety    Arthritis     History of kidney stones    Hypertension    Migraine    Osteopenia 06/2021   DEXA at [facility]; spine and hip    Surgical History: Past Surgical History:  Procedure Laterality Date   ANKLE SURGERY     AUGMENTATION MAMMAPLASTY     Breast Implant   [place]   COLONOSCOPY WITH PROPOFOL N/A 10/01/2020   Procedure: COLONOSCOPY WITH PROPOFOL;  Surgeon: Lin Landsman, MD;  Location: [facility] ENDOSCOPY;  Service: Gastroenterology;  Laterality: N/A;   COSMETIC SURGERY     CYSTOSCOPY W/ RETROGRADES Left 07/19/2021   Procedure: CYSTOSCOPY WITH RETROGRADE PYELOGRAM;  Surgeon: Hollice Espy, MD;  Location: [facility] ORS;  Service: Urology;  Laterality: Left;   CYSTOSCOPY/URETEROSCOPY/HOLMIUM LASER/STENT PLACEMENT Left 07/19/2021   Procedure: CYSTOSCOPY/URETEROSCOPY/HOLMIUM LASER/STENT PLACEMENT;  Surgeon: Hollice Espy, MD;  Location: [facility] ORS;  Service: Urology;  Laterality: Left;   dental implants     ESOPHAGOGASTRODUODENOSCOPY (EGD) WITH PROPOFOL N/A 10/01/2020   Procedure: ESOPHAGOGASTRODUODENOSCOPY (EGD) WITH PROPOFOL;  Surgeon: Lin Landsman, MD;  Location: [facility] ENDOSCOPY;  Service: Gastroenterology;  Laterality: N/A;   HERNIA REPAIR     OTHER SURGICAL HISTORY     tummy tuck   OTHER SURGICAL HISTORY     carpal tunnel   TUBAL LIGATION      Home Medications:  Allergies as of 09/22/2021       Reactions   Codeine Itching   Shellfish Allergy Hives        Medication List        Accurate as of September 22, 2021 11:20  AM. If you have any questions, ask your nurse or doctor.          STOP taking these medications    ALPRAZolam 0.25 MG tablet Commonly known as: Duanne Moron Stopped by: Hollice Espy, MD   HYDROcodone-acetaminophen 5-325 MG tablet Commonly known as: NORCO/VICODIN Stopped by: Hollice Espy, MD   oxybutynin 5 MG tablet Commonly known as: DITROPAN Stopped by: Hollice Espy, MD   tamsulosin 0.4 MG Caps capsule Commonly known as:  Flomax Stopped by: Hollice Espy, MD   tirzepatide 2.5 MG/0.5ML Pen Commonly known as: MOUNJARO Stopped by: Hollice Espy, MD   tirzepatide 5 MG/0.5ML Pen Commonly known as: MOUNJARO Stopped by: Hollice Espy, MD       TAKE these medications    acetaminophen 500 MG tablet Commonly known as: TYLENOL Take 1,000 mg by mouth every 6 (six) hours as needed.   atorvastatin 10 MG tablet Commonly known as: LIPITOR Take 1 tablet (10 mg total) by mouth daily.   calcium carbonate 600 MG Tabs tablet Commonly known as: OS-CAL Take 600 mg by mouth 2 (two) times daily with a meal.   cyclobenzaprine 5 MG tablet Commonly known as: FLEXERIL Take 1 tablet (5 mg total) by mouth 3 (three) times daily as needed for muscle spasms.   docusate sodium 100 MG capsule Commonly known as: COLACE Take 100 mg by mouth daily.   estradiol 0.1 MG/GM vaginal cream Commonly known as: ESTRACE Insert 1g nightly for 1 wk, then 1 g once weekly as maintenace   hydroquinone 4 % cream Apply 1 application topically at bedtime as needed (blemishes).   hydroxychloroquine 200 MG tablet Commonly known as: PLAQUENIL Take 200 mg by mouth daily.   Krill Oil 350 MG Caps Take 350 mg by mouth daily.   loratadine 10 MG tablet Commonly known as: CLARITIN Take 10 mg by mouth daily.   losartan 50 MG tablet Commonly known as: COZAAR Take 1 tablet (50 mg total) by mouth daily.   METAMUCIL FIBER PO Take 1 capsule by mouth daily.   methocarbamol 500 MG tablet Commonly known as: ROBAXIN Take 1 tablet (500 mg total) by mouth 2 (two) times daily.   PRENATAL PO Take 1 tablet by mouth daily.   spironolactone 25 MG tablet Commonly known as: ALDACTONE Take 1 tablet (25 mg total) by mouth 2 (two) times daily.   VITAMIN D-3 PO Take 2,000 mg by mouth daily.        Allergies:  Allergies  Allergen Reactions   Codeine Itching   Shellfish Allergy Hives    Family History: Family History  Problem Relation  Age of Onset   Heart murmur Mother    Thyroid disease Daughter     Social History:  reports that she quit smoking about 3 years ago. Her smoking use included cigarettes. She has a 10.50 pack-year smoking history. She has never used smokeless tobacco. She reports current alcohol use of about 2.0 standard drinks per week. She reports that she does not use drugs.   Physical Exam: BP 124/78    Pulse 82    Ht 5\' 3"  (1.6 m)    Wt 191 lb (86.6 kg)    BMI 33.83 kg/m   Constitutional:  Alert and oriented, No acute distress. HEENT: [place] AT, moist mucus membranes.  Trachea midline, no masses. Cardiovascular: No clubbing, cyanosis, or edema. Respiratory: Normal respiratory effort, no increased work of breathing. Skin: No rashes, bruises or suspicious lesions. Neurologic: Grossly intact, no focal deficits, moving all 4  extremities. Psychiatric: Normal mood and affect.  Laboratory Data:  Lab Results  Component Value Date   CREATININE 0.61 07/07/2021   Lab Results  Component Value Date   HGBA1C 5.1 06/08/2021    Pertinent Imaging:  CLINICAL DATA:  Follow-up calculus   EXAM: RENAL / URINARY TRACT ULTRASOUND COMPLETE   COMPARISON:  04/17/2021 CT   FINDINGS: Right Kidney:   Renal measurements: 9.9 x 4.2 x 5.5 cm. = volume: 120 mL. Echogenicity within normal limits. No mass or hydronephrosis visualized.   Left Kidney:   Renal measurements: 11.7 x 6.0 x 5.5 cm. = volume: 202 mL. Echogenicity within normal limits. No mass or hydronephrosis visualized.   Bladder:   Appears normal for degree of bladder distention.   Other:   None.   IMPRESSION: No renal calculi or obstructive changes are noted.     Electronically Signed   By: Inez Catalina M.D.   On: 09/05/2021 09:41   I have personally reviewed the images and agree with radiologist interpretation.   Assessment & Plan:   Left kidney stone  - Resolved  - We discussed general stone prevention techniques including  drinking plenty water with goal of producing 2.5 L urine daily, increased citric acid intake, avoidance of high oxalate containing foods, and decreased salt intake.  Information about dietary recommendations given today.  - Will undergo KUB in 1 year  2. Urinary frequency  - Discussed how the may be secondary to stent, should improve with time and if it does not, she was advised to return or call   Return in 1 year for KUB  I,Kailey Littlejohn,acting as a scribe for Hollice Espy, MD.,have documented all relevant documentation on the behalf of Hollice Espy, MD,as directed by  Hollice Espy, MD while in the presence of Hollice Espy, MD.  I have reviewed the above documentation for accuracy and completeness, and I agree with the above.   Hollice Espy, MD   [facility] [address], [place], [place] [address] [phone]

## 2021-09-22 ENCOUNTER — Other Ambulatory Visit: Payer: Self-pay

## 2021-09-22 ENCOUNTER — Ambulatory Visit (INDEPENDENT_AMBULATORY_CARE_PROVIDER_SITE_OTHER): Payer: 59 | Admitting: Urology

## 2021-09-22 ENCOUNTER — Encounter: Payer: Self-pay | Admitting: Urology

## 2021-09-22 VITALS — BP 124/78 | HR 82 | Ht 63.0 in | Wt 191.0 lb

## 2021-09-22 DIAGNOSIS — N200 Calculus of kidney: Secondary | ICD-10-CM | POA: Diagnosis not present

## 2021-09-29 NOTE — Progress Notes (Signed)
I,Sha'taria Tyson,acting as a Education administrator for [employer], PA-C.,have documented all relevant documentation on the behalf of [name], PA-C,as directed by  [name], PA-C while in the presence of [name], PA-C.  Established Patient Office Visit  Subjective:  Patient ID: Joan Johnson, female    DOB: 05/21/1971  Age: 51 y.o. MRN: [id number]  CC: cc. Htn fu   HPI Joan Johnson presents for 3 month weight management and hypertension follow up.  Weight management -Darcel Bayley was declined. Pt requests another weight medication.  Sinus pressure --For the past 2-3 days she has woken up with headaches, sore throat, increase sinus pressure, pain, mucous. Denies fevers, body aches. Takes tylenol.    Hypertension, follow-up Still feels lightheaded/ vertigo symptoms Describes being triggered by certain patterns--ie blinds, holes in the ground. Reports dizziness lasts a few seconds and goes away, but she is nervous that it won't go away.  Has had an episode years ago that lasted 2 weeks.   BP Readings from Last 3 Encounters:  09/30/21 112/78  09/22/21 124/78  09/07/21 119/62   Wt Readings from Last 3 Encounters:  09/30/21 197 lb 6.4 oz (89.5 kg)  09/22/21 191 lb (86.6 kg)  09/07/21 191 lb (86.6 kg)     She was last seen for hypertension 3 months ago.  BP at that visit was 112/90. Management since that visit includes continue losartan 50 mg daily, spironolactone 25 mg BID .  She reports good compliance with treatment. She is not having side effects.  She is following a Regular diet. She is not exercising. She does not smoke.  Use of agents associated with hypertension: none.   Outside blood pressures are not being checked. Symptoms: No chest pain No chest pressure  No palpitations No syncope  No dyspnea No orthopnea  No paroxysmal nocturnal dyspnea No lower extremity edema   Pertinent labs: Lab Results  Component Value Date   CHOL 223 (H) 06/08/2021    HDL 47 06/08/2021   LDLCALC 156 (H) 06/08/2021   TRIG 113 06/08/2021   CHOLHDL 4.7 (H) 06/08/2021   Lab Results  Component Value Date   NA 138 07/07/2021   K 3.8 07/07/2021   CREATININE 0.61 07/07/2021   GFRNONAA >60 07/07/2021   GLUCOSE 119 (H) 07/07/2021   TSH 1.380 07/30/2020     The 10-year ASCVD risk score (Arnett DK, et al., 2019) is: 1.5%   ---------------------------------------------------------------------------------------------------   Past Medical History:  Diagnosis Date   Allergy    Anxiety    Arthritis    History of kidney stones    Hypertension    Migraine    Osteopenia 06/2021   DEXA at [facility]; spine and hip    Past Surgical History:  Procedure Laterality Date   ANKLE SURGERY     AUGMENTATION MAMMAPLASTY     Breast Implant   [place]   COLONOSCOPY WITH PROPOFOL N/A 10/01/2020   Procedure: COLONOSCOPY WITH PROPOFOL;  Surgeon: Lin Landsman, MD;  Location: [facility] ENDOSCOPY;  Service: Gastroenterology;  Laterality: N/A;   COSMETIC SURGERY     CYSTOSCOPY W/ RETROGRADES Left 07/19/2021   Procedure: CYSTOSCOPY WITH RETROGRADE PYELOGRAM;  Surgeon: Hollice Espy, MD;  Location: [facility] ORS;  Service: Urology;  Laterality: Left;   CYSTOSCOPY/URETEROSCOPY/HOLMIUM LASER/STENT PLACEMENT Left 07/19/2021   Procedure: CYSTOSCOPY/URETEROSCOPY/HOLMIUM LASER/STENT PLACEMENT;  Surgeon: Hollice Espy, MD;  Location: [facility] ORS;  Service: Urology;  Laterality: Left;   dental implants     ESOPHAGOGASTRODUODENOSCOPY (  EGD) WITH PROPOFOL N/A 10/01/2020   Procedure: ESOPHAGOGASTRODUODENOSCOPY (EGD) WITH PROPOFOL;  Surgeon: Lin Landsman, MD;  Location: [facility] ENDOSCOPY;  Service: Gastroenterology;  Laterality: N/A;   HERNIA REPAIR     OTHER SURGICAL HISTORY     tummy tuck   OTHER SURGICAL HISTORY     carpal tunnel   TUBAL LIGATION      Family History  Problem Relation Age of Onset   Heart murmur Mother    Thyroid disease Daughter      Social History   Socioeconomic History   Marital status: Married    Spouse name: Luis   Number of children: 2   Years of education: Not on file   Highest education level: Not on file  Occupational History   Not on file  Tobacco Use   Smoking status: Former    Packs/day: 0.50    Years: 21.00    Pack years: 10.50    Types: Cigarettes    Quit date: 03/29/2018    Years since quitting: 3.5   Smokeless tobacco: Never  Vaping Use   Vaping Use: Never used  Substance and Sexual Activity   Alcohol use: Yes    Alcohol/week: 2.0 standard drinks    Types: 2 Glasses of wine per week    Comment: weekly   Drug use: Never   Sexual activity: Yes    Birth control/protection: Post-menopausal  Other Topics Concern   Not on file  Social History Narrative   ** Merged History Encounter **       Social Determinants of Health   Financial Resource Strain: Not on file  Food Insecurity: Not on file  Transportation Needs: Not on file  Physical Activity: Not on file  Stress: Not on file  Social Connections: Not on file  Intimate Partner Violence: Not on file    Outpatient Medications Prior to Visit  Medication Sig Dispense Refill   acetaminophen (TYLENOL) 500 MG tablet Take 1,000 mg by mouth every 6 (six) hours as needed.     atorvastatin (LIPITOR) 10 MG tablet Take 1 tablet (10 mg total) by mouth daily. 90 tablet 3   calcium carbonate (OS-CAL) 600 MG TABS tablet Take 600 mg by mouth 2 (two) times daily with a meal.     Cholecalciferol (VITAMIN D-3 PO) Take 2,000 mg by mouth daily.     cyclobenzaprine (FLEXERIL) 5 MG tablet Take 1 tablet (5 mg total) by mouth 3 (three) times daily as needed for muscle spasms. 30 tablet 1   docusate sodium (COLACE) 100 MG capsule Take 100 mg by mouth daily.     estradiol (ESTRACE) 0.1 MG/GM vaginal cream Insert 1g nightly for 1 wk, then 1 g once weekly as maintenace 42.5 g 1   hydroquinone 4 % cream Apply 1 application topically at bedtime as needed  (blemishes).     hydroxychloroquine (PLAQUENIL) 200 MG tablet Take 200 mg by mouth daily.     Krill Oil 350 MG CAPS Take 350 mg by mouth daily.     loratadine (CLARITIN) 10 MG tablet Take 10 mg by mouth daily.     METAMUCIL FIBER PO Take 1 capsule by mouth daily.     methocarbamol (ROBAXIN) 500 MG tablet Take 1 tablet (500 mg total) by mouth 2 (two) times daily. 20 tablet 0   Prenatal Vit-Fe Fumarate-FA (PRENATAL PO) Take 1 tablet by mouth daily.     losartan (COZAAR) 50 MG tablet Take 1 tablet (50 mg total) by mouth daily. [place]  tablet 2   spironolactone (ALDACTONE) 25 MG tablet Take 1 tablet (25 mg total) by mouth 2 (two) times daily. 180 tablet 0   No facility-administered medications prior to visit.    Allergies  Allergen Reactions   Codeine Itching   Shellfish Allergy Hives    ROS Review of Systems  Constitutional:  Negative for fatigue and fever.  HENT:  Positive for congestion, postnasal drip, rhinorrhea, sinus pressure, sinus pain and sore throat.   Respiratory:  Negative for cough and shortness of breath.   Cardiovascular:  Negative for chest pain and leg swelling.  Gastrointestinal:  Negative for abdominal pain.  Neurological:  Positive for dizziness, light-headedness and headaches.     Objective:    Physical Exam Constitutional:      Appearance: Normal appearance. She is not ill-appearing.  HENT:     Head: Normocephalic.     Right Ear: Tympanic membrane normal.     Left Ear: Tympanic membrane normal.     Nose: Congestion and rhinorrhea present.  Eyes:     Conjunctiva/sclera: Conjunctivae normal.     Pupils: Pupils are equal, round, and reactive to light.  Cardiovascular:     Rate and Rhythm: Normal rate and regular rhythm.     Pulses: Normal pulses.     Heart sounds: Normal heart sounds.  Pulmonary:     Effort: Pulmonary effort is normal.     Breath sounds: Normal breath sounds.  Neurological:     Mental Status: She is oriented to person, place, and time.   Psychiatric:        Attention and Perception: She is inattentive.        Mood and Affect: Mood normal.        Speech: Speech is tangential.        Behavior: Behavior is hyperactive.        Thought Content: Thought content normal.        Cognition and Memory: Cognition normal.    BP 112/78 (BP Location: Right Arm, Patient Position: Sitting, Cuff Size: Normal)    Pulse 80    Ht '5\' 3"'  (1.6 m)    Wt 197 lb 6.4 oz (89.5 kg)    SpO2 100%    BMI 34.97 kg/m  Wt Readings from Last 3 Encounters:  09/30/21 197 lb 6.4 oz (89.5 kg)  09/22/21 191 lb (86.6 kg)  09/07/21 191 lb (86.6 kg)     Health Maintenance Due  Topic Date Due   HIV Screening  Never done   Hepatitis C Screening  Never done   COVID-19 Vaccine (3 - Booster for Moderna series) 11/04/2019   Zoster Vaccines- Shingrix (1 of 2) Never done    There are no preventive care reminders to display for this patient.  Lab Results  Component Value Date   TSH 1.380 07/30/2020   Lab Results  Component Value Date   WBC 6.1 07/07/2021   HGB 13.6 07/07/2021   HCT 40.4 07/07/2021   MCV 93.7 07/07/2021   PLT 285 07/07/2021   Lab Results  Component Value Date   NA 138 07/07/2021   K 3.8 07/07/2021   CO2 28 07/07/2021   GLUCOSE 119 (H) 07/07/2021   BUN 12 07/07/2021   CREATININE 0.61 07/07/2021   BILITOT 1.0 06/08/2021   ALKPHOS 58 06/08/2021   AST 16 06/08/2021   ALT 17 06/08/2021   PROT 6.4 06/08/2021   ALBUMIN 4.2 06/08/2021   CALCIUM 9.5 07/07/2021   ANIONGAP 6 07/07/2021  EGFR 95 06/08/2021   Lab Results  Component Value Date   CHOL 223 (H) 06/08/2021   Lab Results  Component Value Date   HDL 47 06/08/2021   Lab Results  Component Value Date   LDLCALC 156 (H) 06/08/2021   Lab Results  Component Value Date   TRIG 113 06/08/2021   Lab Results  Component Value Date   CHOLHDL 4.7 (H) 06/08/2021   Lab Results  Component Value Date   HGBA1C 5.1 06/08/2021      Assessment & Plan:   Problem List Items  Addressed This Visit       Cardiovascular and Mediastinum   Hypertension - Primary    May be experiencing hypotension mixed with vertigo ? See vertigo note. D/c spironolactone, pt was only taking for hair loss and denies any bald spots or significant hairloss, unsure if medication actually improved. Continue with losartan 50 mg.         Other   BMI 34.0-34.9,adult    Mounjaro denied by insurance. At this point I do not support her starting any weight loss medications Reviewed diet and exercise.       Vertigo    Visually trigger? Pt states she was eval by ENT previously without abnormality rx meclizine PRN sustained vertigo. Can take up to TID at least 8 hours apart if needed. May be exacerbated by current sinus congestion       Relevant Medications   meclizine (ANTIVERT) 12.5 MG tablet   Post-nasal drip    rx azelastine Encouraged her to take her claritin daily Saline rinses      Relevant Medications   azelastine (ASTELIN) 0.1 % nasal spray     Follow-up: Return in about 4 months (around 01/30/2022) for hypertension, pt declined to make appt in office.    I, [name], PA-C have reviewed all documentation for this visit. The documentation on  09/30/2021 for the exam, diagnosis, procedures, and orders are all accurate and complete.  [name], PA-C [facility] [address] [place], [place], [address] Office: [phone] Fax: [phone]

## 2021-09-30 ENCOUNTER — Other Ambulatory Visit: Payer: Self-pay

## 2021-09-30 ENCOUNTER — Ambulatory Visit: Payer: 59 | Admitting: Physician Assistant

## 2021-09-30 ENCOUNTER — Encounter: Payer: Self-pay | Admitting: Physician Assistant

## 2021-09-30 VITALS — BP 112/78 | HR 80 | Ht 63.0 in | Wt 197.4 lb

## 2021-09-30 DIAGNOSIS — Z6834 Body mass index (BMI) 34.0-34.9, adult: Secondary | ICD-10-CM

## 2021-09-30 DIAGNOSIS — I10 Essential (primary) hypertension: Secondary | ICD-10-CM | POA: Diagnosis not present

## 2021-09-30 DIAGNOSIS — R42 Dizziness and giddiness: Secondary | ICD-10-CM | POA: Diagnosis not present

## 2021-09-30 DIAGNOSIS — R0982 Postnasal drip: Secondary | ICD-10-CM | POA: Insufficient documentation

## 2021-09-30 MED ORDER — MECLIZINE HCL 12.5 MG PO TABS: 12.5000 mg | ORAL_TABLET | Freq: Two times a day (BID) | ORAL | 0 refills | Status: DC | PRN

## 2021-09-30 MED ORDER — AZELASTINE HCL 0.1 % NA SOLN: 1.0000 | Freq: Two times a day (BID) | NASAL | 12 refills | Status: DC

## 2021-09-30 NOTE — Assessment & Plan Note (Signed)
May be experiencing hypotension mixed with vertigo ? See vertigo note. ?D/c spironolactone, pt was only taking for hair loss and denies any bald spots or significant hairloss, unsure if medication actually improved. ?Continue with losartan 50 mg.  ?

## 2021-09-30 NOTE — Assessment & Plan Note (Signed)
Mounjaro denied by insurance. ?At this point I do not support her starting any weight loss medications ?Reviewed diet and exercise. ? ?

## 2021-09-30 NOTE — Assessment & Plan Note (Signed)
rx azelastine ?Encouraged her to take her claritin daily ?Saline rinses ?

## 2021-09-30 NOTE — Assessment & Plan Note (Addendum)
Visually trigger? Pt states she was eval by ENT previously without abnormality ?rx meclizine PRN sustained vertigo. Can take up to TID at least 8 hours apart if needed. ?May be exacerbated by current sinus congestion ? ?

## 2021-12-02 ENCOUNTER — Telehealth: Payer: Self-pay | Admitting: Physician Assistant

## 2021-12-02 NOTE — Telephone Encounter (Signed)
Referral Request - Has patient seen PCP for this complaint? Yes.   ?*If NO, is insurance requiring patient see PCP for this issue before PCP can refer them? ?Referral for which specialty: rheumatology ?Preferred provider/office: [facility] [facility] ? ?Reason for referral: pt was traveling back and forth from [place], had one in [place].  Prefers to have one here since no longer going to [place] ? ?

## 2021-12-03 ENCOUNTER — Other Ambulatory Visit: Payer: Self-pay | Admitting: Physician Assistant

## 2021-12-03 DIAGNOSIS — M059 Rheumatoid arthritis with rheumatoid factor, unspecified: Secondary | ICD-10-CM

## 2021-12-09 ENCOUNTER — Ambulatory Visit: Payer: Self-pay

## 2021-12-09 NOTE — Telephone Encounter (Signed)
?  Chief Complaint: diarrhea ?Symptoms: diarrhea, abdominal pain ?Frequency: 2-3 days  ?Pertinent Negatives:  ?Disposition: '[]'$ ED /'[]'$ Urgent Care (no appt availability in office) / '[]'$ Appointment(In office/virtual)/ '[]'$  [facility]/ '[x]'$ Home Care/ '[]'$ Refused Recommended Disposition /'[]'$ [facility]/ '[]'$  Follow-up with PCP ?Additional Notes: pt states she has been having diarrhea and abdominal pain but the abdominal pain has resolved and just feels really bubbly. 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 Care advice given and pt verbalized understanding.  ? ?Reason for Disposition ? MILD-MODERATE diarrhea (e.g., 1-6 times / day more than normal) ? ?Answer Assessment - Initial Assessment Questions ?1. DIARRHEA SEVERITY: "How bad is the diarrhea?" "How many more stools have you had in the past 24 hours than normal?"  ?  - NO DIARRHEA (SCALE 0) ?  - MILD (SCALE 1-3): Few loose or mushy BMs; increase of 1-3 stools over normal daily number of stools; mild increase in ostomy output. ?  -  MODERATE (SCALE 4-7): Increase of 4-6 stools daily over normal; moderate increase in ostomy output. ?* SEVERE (SCALE 8-10; OR 'WORST POSSIBLE'): Increase of 7 or more stools daily over normal; moderate increase in ostomy output; incontinence. ?    mild ?2. ONSET: "When did the diarrhea begin?"  ?    2-3 days ago  ?3. BM CONSISTENCY: "How loose or watery is the diarrhea?"  ?    Soft and mushy ?4. VOMITING: "Are you also vomiting?" If Yes, ask: "How many times in the past 24 hours?"  ?    NO ?5. ABDOMINAL PAIN: "Are you having any abdominal pain?" If Yes, ask: "What does it feel like?" (e.g., crampy, dull, intermittent, constant)  ?    Went away  ?7. ORAL INTAKE: If vomiting, "Have you been able to drink liquids?" "How much liquids have you had in the past 24 hours?" ?     ?yes ?11. OTHER SYMPTOMS: "Do you have any other symptoms?" (e.g., fever, blood in stool) ?       NO ? ?Protocols used: Diarrhea-A-AH ? ?

## 2021-12-16 NOTE — Progress Notes (Deleted)
Established patient visit   Patient: Joan Johnson   DOB: 19-Jan-1971   51 y.o. Female  MRN: [id number] Visit Date: 12/17/2021  Today's healthcare provider: [name], PA-C   No chief complaint on file.  Subjective    HPI  Hypertension, follow-up  BP Readings from Last 3 Encounters:  09/30/21 112/78  09/22/21 124/78  09/07/21 119/62   Wt Readings from Last 3 Encounters:  09/30/21 197 lb 6.4 oz (89.5 kg)  09/22/21 191 lb (86.6 kg)  09/07/21 191 lb (86.6 kg)     She was last seen for hypertension 2 months ago.  BP at that visit was as above. Management since that visit includes discontinued Spironolactone.  She reports {excellent/good/fair/poor:19665} compliance with treatment. She {is/is not:9024} having side effects. {document side effects if present:1} She is following a {diet:21022986} diet. She {is/is not:9024} exercising. She {does/does not:200015} smoke.  Use of agents associated with hypertension: {bp agents assoc with hypertension:511::"none"}.   Outside blood pressures are {***enter patient reported home BP readings, or 'not being checked':1}. Symptoms: {Yes/No:20286} chest pain {Yes/No:20286} chest pressure  {Yes/No:20286} palpitations {Yes/No:20286} syncope  {Yes/No:20286} dyspnea {Yes/No:20286} orthopnea  {Yes/No:20286} paroxysmal nocturnal dyspnea {Yes/No:20286} lower extremity edema   Pertinent labs Lab Results  Component Value Date   CHOL 223 (H) 06/08/2021   HDL 47 06/08/2021   LDLCALC 156 (H) 06/08/2021   TRIG 113 06/08/2021   CHOLHDL 4.7 (H) 06/08/2021   Lab Results  Component Value Date   NA 138 07/07/2021   K 3.8 07/07/2021   CREATININE 0.61 07/07/2021   GFRNONAA >60 07/07/2021   GLUCOSE 119 (H) 07/07/2021   TSH 1.380 07/30/2020     The 10-year ASCVD risk score (Arnett DK, et al., 2019) is:  1.5%  ---------------------------------------------------------------------------------------------------   Medications: Outpatient Medications Prior to Visit  Medication Sig   acetaminophen (TYLENOL) 500 MG tablet Take 1,000 mg by mouth every 6 (six) hours as needed.   atorvastatin (LIPITOR) 10 MG tablet Take 1 tablet (10 mg total) by mouth daily.   azelastine (ASTELIN) 0.1 % nasal spray Place 1 spray into both nostrils 2 (two) times daily. Use in each nostril as directed   calcium carbonate (OS-CAL) 600 MG TABS tablet Take 600 mg by mouth 2 (two) times daily with a meal.   Cholecalciferol (VITAMIN D-3 PO) Take 2,000 mg by mouth daily.   cyclobenzaprine (FLEXERIL) 5 MG tablet Take 1 tablet (5 mg total) by mouth 3 (three) times daily as needed for muscle spasms.   docusate sodium (COLACE) 100 MG capsule Take 100 mg by mouth daily.   estradiol (ESTRACE) 0.1 MG/GM vaginal cream Insert 1g nightly for 1 wk, then 1 g once weekly as maintenace   hydroquinone 4 % cream Apply 1 application topically at bedtime as needed (blemishes).   hydroxychloroquine (PLAQUENIL) 200 MG tablet Take 200 mg by mouth daily.   Krill Oil 350 MG CAPS Take 350 mg by mouth daily.   loratadine (CLARITIN) 10 MG tablet Take 10 mg by mouth daily.   losartan (COZAAR) 50 MG tablet Take 1 tablet (50 mg total) by mouth daily.   meclizine (ANTIVERT) 12.5 MG tablet Take 1 tablet (12.5 mg total) by mouth 2 (two) times daily as needed for dizziness.   METAMUCIL FIBER PO Take 1 capsule by mouth daily.   methocarbamol (ROBAXIN) 500 MG tablet Take 1 tablet (500 mg total) by mouth 2 (two) times daily.   Prenatal Vit-Fe Fumarate-FA (PRENATAL PO) Take 1 tablet by mouth  daily.   No facility-administered medications prior to visit.    Review of Systems  {Labs  Heme  Chem  Endocrine  Serology  Results Review (optional):23779}   Objective    There were no vitals taken for this visit. {Show previous vital signs  (optional):23777}  Physical Exam  ***  No results found for any visits on 12/17/21.  Assessment & Plan     ***  No follow-ups on file.      {provider attestation***:1}   [name], PA-C  [facility] [phone] (phone) [phone] (fax)  [place]

## 2021-12-17 ENCOUNTER — Ambulatory Visit: Payer: 59 | Admitting: Physician Assistant

## 2021-12-22 ENCOUNTER — Encounter: Payer: Self-pay | Admitting: Physician Assistant

## 2021-12-22 ENCOUNTER — Ambulatory Visit: Payer: 59 | Admitting: Physician Assistant

## 2021-12-22 VITALS — BP 130/82 | HR 77 | Ht 63.0 in | Wt 204.6 lb

## 2021-12-22 DIAGNOSIS — R42 Dizziness and giddiness: Secondary | ICD-10-CM

## 2021-12-22 DIAGNOSIS — Z6836 Body mass index (BMI) 36.0-36.9, adult: Secondary | ICD-10-CM

## 2021-12-22 DIAGNOSIS — I10 Essential (primary) hypertension: Secondary | ICD-10-CM | POA: Diagnosis not present

## 2021-12-22 DIAGNOSIS — R197 Diarrhea, unspecified: Secondary | ICD-10-CM | POA: Insufficient documentation

## 2021-12-22 MED ORDER — LOSARTAN POTASSIUM 50 MG PO TABS: 50.0000 mg | ORAL_TABLET | Freq: Every day | ORAL | 2 refills | Status: DC

## 2021-12-22 NOTE — Assessment & Plan Note (Signed)
Seems to be resolving. Advised pt to d/c all otc meds as they seem to be exacerbating her problem. If off all otc meds for 1 week and still having changes in BM please call office

## 2021-12-22 NOTE — Assessment & Plan Note (Signed)
Lengthy discussion of diet, sleep, exercise Pt again requests diet medication. Not a candidate as unwilling to change lifestyle. Refer to nutritionist.

## 2021-12-22 NOTE — Assessment & Plan Note (Signed)
States she takes meclizine 1-2 times daily but only #30 were sent 3/23  Refer to ENT

## 2021-12-22 NOTE — Progress Notes (Signed)
I,Sha'taria Tyson,acting as a Education administrator for [employer], PA-C.,have documented all relevant documentation on the behalf of [name], PA-C,as directed by  [name], PA-C while in the presence of [name], PA-C.  Acute Office Visit  Subjective:     Patient ID: Joan Johnson, female    DOB: 1970/12/21, 51 y.o.   MRN: [id number]  Cc. Diarrhea x 2 weeks Pt reports cramping abdominal pain a loose stools x 2 weeks. Initially very loose 'bad' unable to quantify how many times a day. Reports was dark in color and then green. Now twice daily and soft but not loose, lighter brown color. Denies any current abdominal pain. Reports at one point her hemorrhoids flared and she noticed some blood on the toilet paper but now resolved. She was self treating with pepto bismol and immodium, which made her constipated and she started laxative suppositories. Denies any blood in stool, vomiting.   She reports continued vertigo controlled with meclizine but she takes 1-2 times daily. Reports ENT eval years ago. Reports triggered by lights sometimes. Denies falls, syncope, tinnitus, hearing changes.   She reports an increase in weight gain, fatigue, daytime sleepiness. She does admit to snoring. Denies history of sleep study. Denies significant changes in diet, denies any exercise. Reports sleeping only 4 hours a night.     Objective:  Blood pressure 130/82, pulse 77, height '5\' 3"'$  (1.6 m), weight 204 lb 9.6 oz (92.8 kg), SpO2 100 %.   Physical Exam Vitals reviewed.  Constitutional:      Appearance: She is not ill-appearing.  HENT:     Head: Normocephalic.  Eyes:     Conjunctiva/sclera: Conjunctivae normal.  Cardiovascular:     Rate and Rhythm: Normal rate.  Pulmonary:     Effort: Pulmonary effort is normal. No respiratory distress.  Neurological:     General: No focal deficit present.     Mental Status: She is alert and oriented to person, place, and time.  Psychiatric:        Mood  and Affect: Mood normal.        Speech: Speech is rapid and pressured and tangential.    No results found for any visits on 12/22/21.      Assessment & Plan:   Problem List Items Addressed This Visit       Cardiovascular and Mediastinum   Hypertension    Well controlled on medication pulled for refill       Relevant Medications   losartan (COZAAR) 50 MG tablet     Other   BMI 36.0-36.9,adult    Lengthy discussion of diet, sleep, exercise Pt again requests diet medication. Not a candidate as unwilling to change lifestyle. Refer to nutritionist.        Relevant Orders   Amb ref to Medical Nutrition Therapy-MNT   Vertigo    States she takes meclizine 1-2 times daily but only #30 were sent 3/23  Refer to ENT       Relevant Orders   Ambulatory referral to ENT   Diarrhea - Primary    Seems to be resolving. Advised pt to d/c all otc meds as they seem to be exacerbating her problem. If off all otc meds for 1 week and still having changes in BM please call office       I, [name], PA-C have reviewed all documentation for this visit. The documentation on  12/22/2021 for the exam, diagnosis, procedures, and orders are all accurate and complete.  [name], PA-C [facility] [address] [place], [place], [address] Office: [phone] Fax: [phone]

## 2021-12-22 NOTE — Assessment & Plan Note (Signed)
Well controlled on medication pulled for refill

## 2021-12-30 IMAGING — CR DG ABDOMEN 1V
1 series · 2 of 2 positions shown · non-contrast
Comparison: CT dated 04/17/2021.

CLINICAL DATA: Kidney stone.

EXAM:
ABDOMEN - 1 VIEW

[Series 1: dg abd 1 view · 0.14mm/px · 2 of 2 slices shown]
[im 1/2]
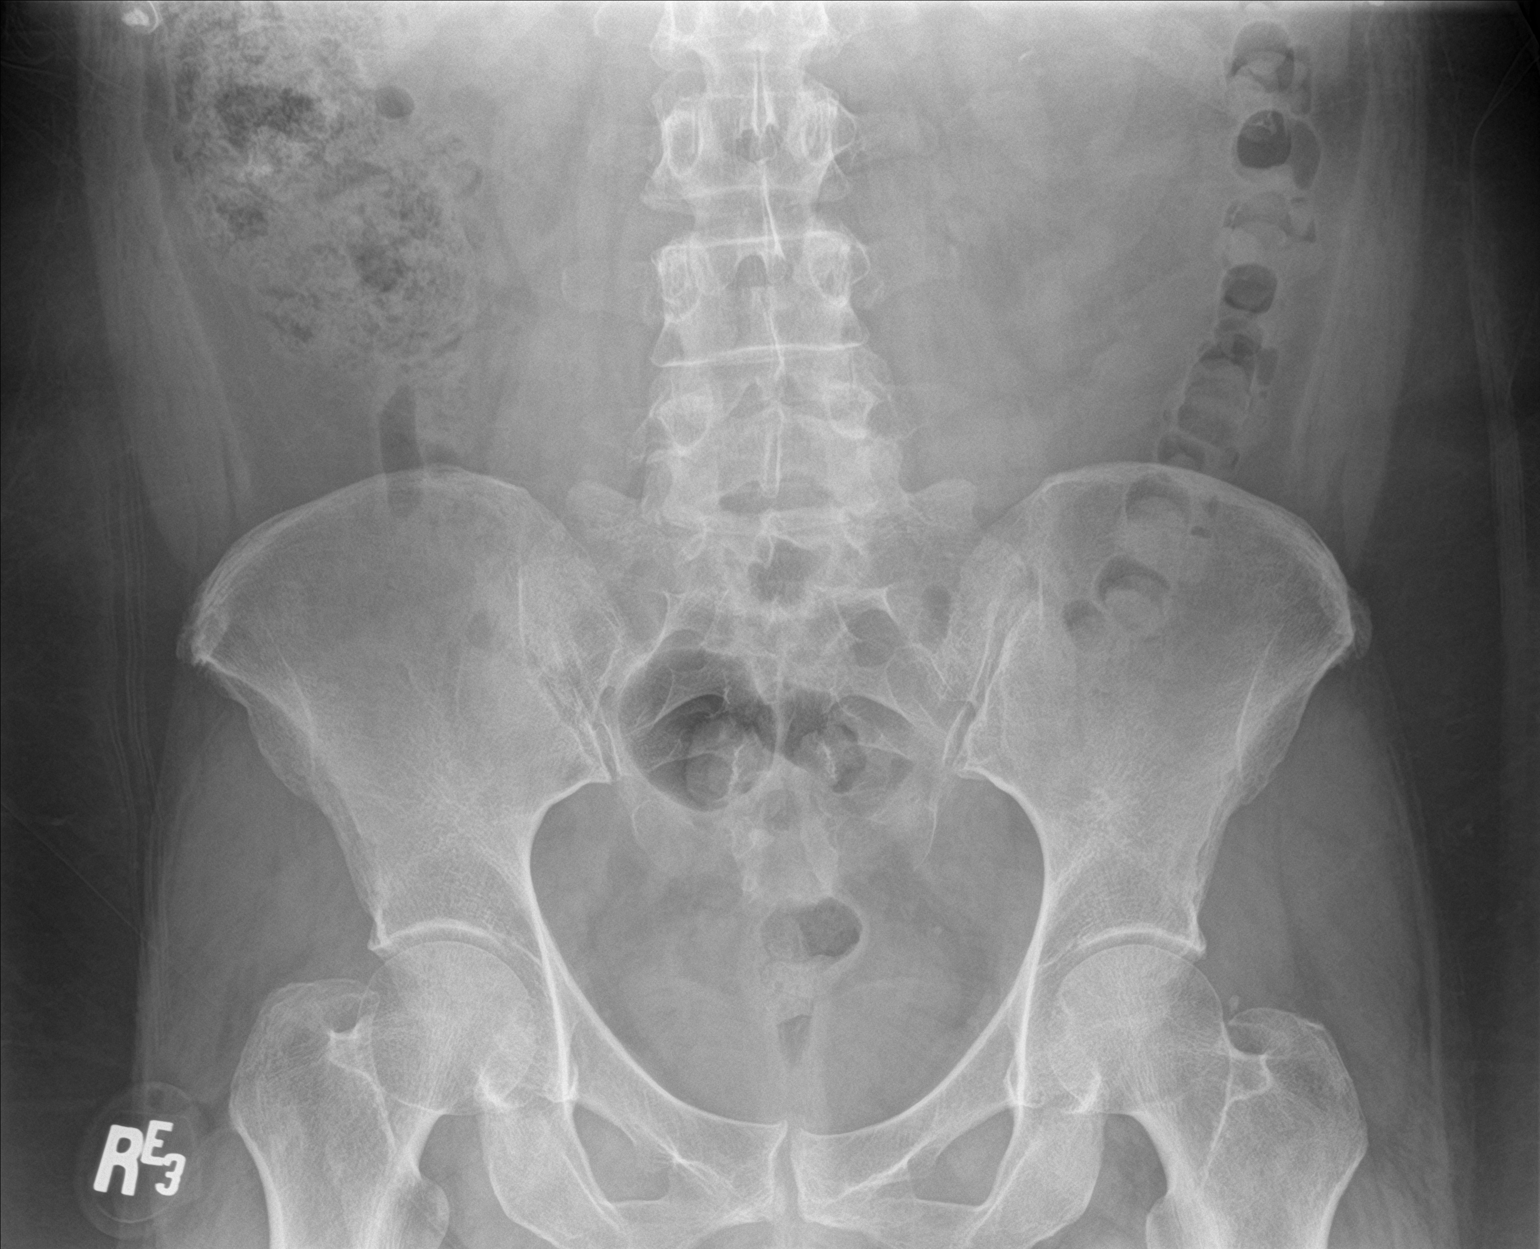
[im 2/2]
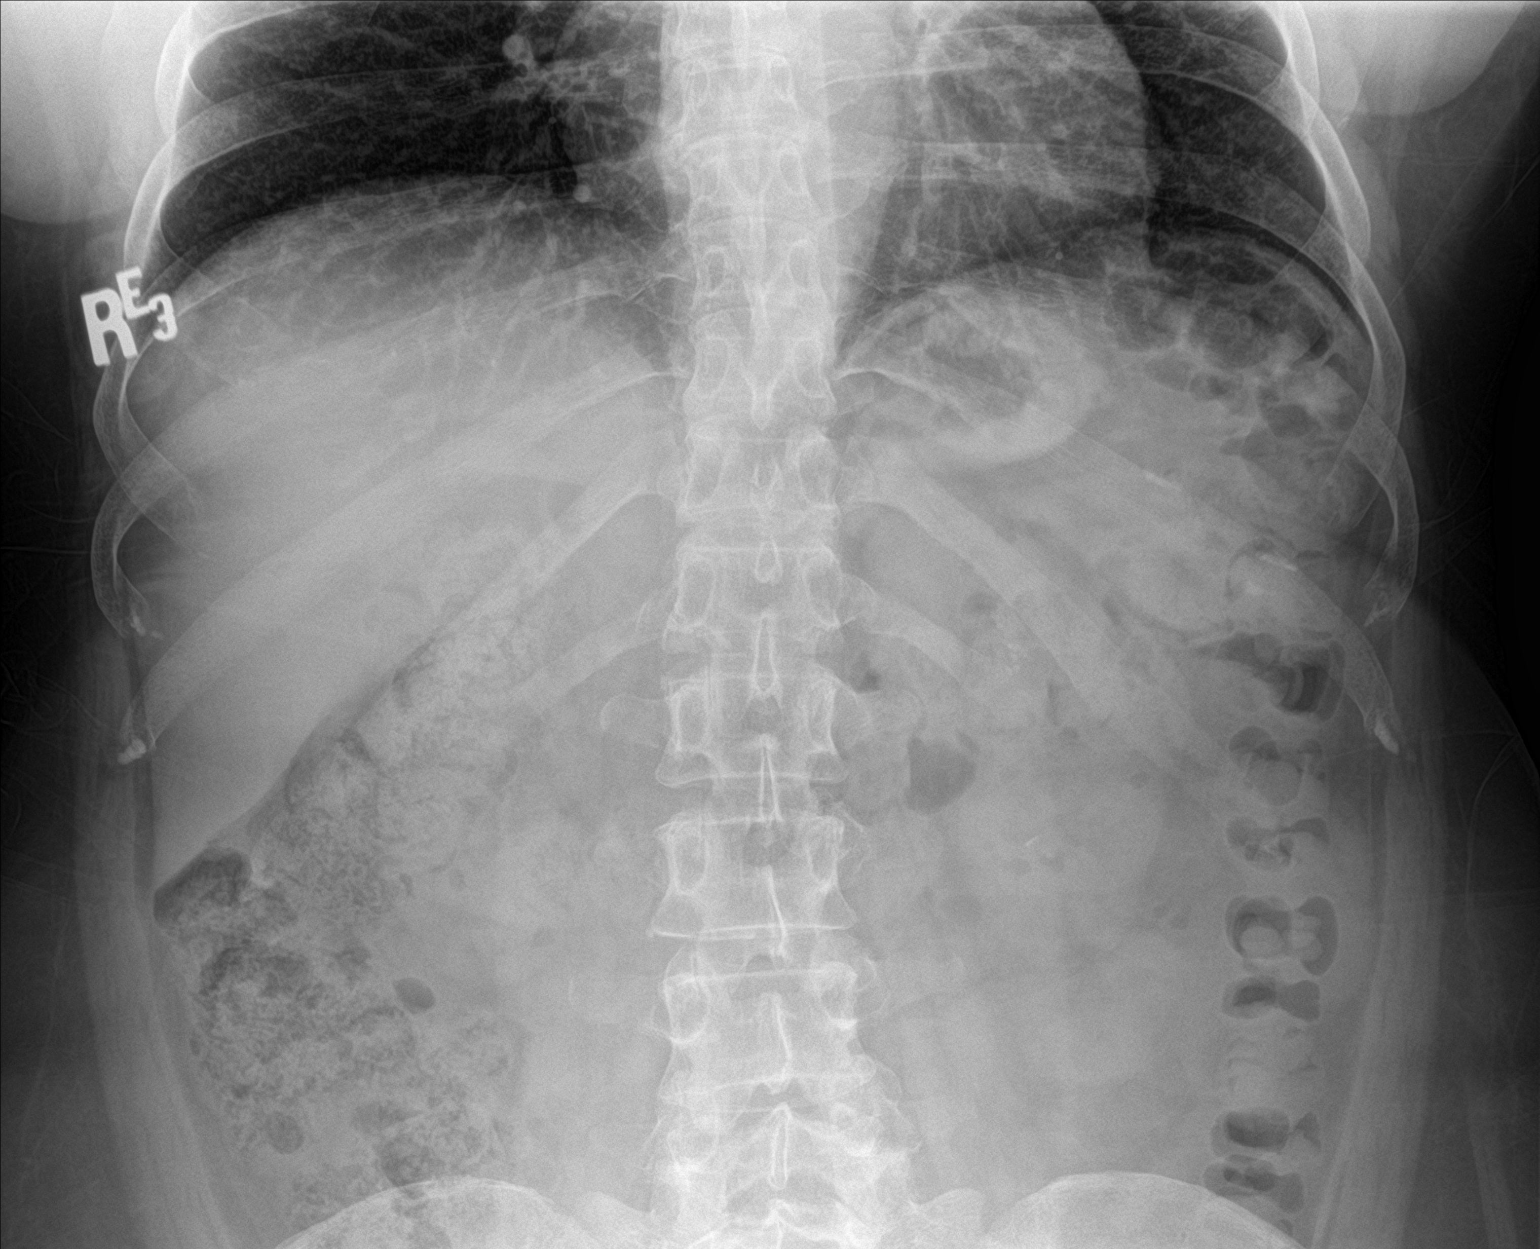

[2 of 2 positions shown; findings below may reference images not displayed]

FINDINGS: Several small stones noted in the left kidney. No radiopaque foci
noted over the right renal silhouette. There is a punctate
radiopaque focus in the left hemipelvis which may represent a
phlebolith or corresponds to the previously seen stone in the distal
left ureter. The larger stone seen at the left UVJ on the prior CT
is no longer visualized on this radiograph.

No bowel dilatation or evidence of obstruction. No free air. No
acute osseous pathology.
IMPRESSION: 1. Left renal calculi.
2. Interval passage of the previously seen stone at the left UVJ.
3. Punctate phlebolith in the left hemipelvis versus less likely a
stone in the distal left ureter.

## 2021-12-31 ENCOUNTER — Telehealth: Payer: Self-pay

## 2021-12-31 NOTE — Telephone Encounter (Signed)
Pt would like RF on diflucan. She used vagisil but didn't work. Advised to get monistat 7 and if it didn't work call our office to schedule an appt.

## 2022-01-05 ENCOUNTER — Ambulatory Visit: Payer: Self-pay

## 2022-01-05 NOTE — Progress Notes (Deleted)
    I,Sha'taria Devereaux Grayson,acting as a Education administrator for [employer], PA-C.,have documented all relevant documentation on the behalf of [name], PA-C,as directed by  [name], PA-C while in the presence of [name], PA-C.   Acute Office Visit  Subjective:     Patient ID: Joan Johnson, female    DOB: 02/25/1971, 51 y.o.   MRN: [id number]  No chief complaint on file.   HPI Patient is in today for swelling to her ankles.   ROS      Objective:    There were no vitals taken for this visit. {Vitals History (Optional):23777}  Physical Exam  No results found for any visits on 01/06/22.      Assessment & Plan:   Problem List Items Addressed This Visit   None   No orders of the defined types were placed in this encounter.   No follow-ups on file.  Beverlee Nims, [place]

## 2022-01-05 NOTE — Telephone Encounter (Signed)
Pt called to report that her ankles have been swollen since yesterday, wants advice from nurse   Best contact: [phone]    Chief Complaint: Both ankles swollen Symptoms: Swelling Frequency: Yesterday Pertinent Negatives: Patient denies SOB Disposition: '[]'$ ED /'[]'$ Urgent Care (no appt availability in office) / '[x]'$ Appointment(In office/virtual)/ '[]'$  [facility]/ '[]'$ Home Care/ '[]'$ Refused Recommended Disposition /'[]'$ [facility]/ '[]'$  Follow-up with PCP Additional Notes: States had this 2 years ago and resolved.  Reason for Disposition  Swollen ankle joint  (Exception: area of localized swelling which is itchy)  Answer Assessment - Initial Assessment Questions 1. LOCATION: "Which ankle is swollen?" "Where is the swelling?"     Both ankles 2. ONSET: "When did the swelling start?"     Yesterday 3. SIZE: "How large is the swelling?"     Moderate 4. PAIN: "Is there any pain?" If Yes, ask: "How bad is it?" (Scale 1-10; or mild, moderate, severe)   - NONE (0): no pain.   - MILD (1-3): doesn't interfere with normal activities.    - MODERATE (4-7): interferes with normal activities (e.g., work or school) or awakens from sleep, limping.    - SEVERE (8-10): excruciating pain, unable to do any normal activities, unable to walk.      No 5. CAUSE: "What do you think caused the ankle swelling?"     Unsure 6. OTHER SYMPTOMS: "Do you have any other symptoms?" (e.g., fever, chest pain, difficulty breathing, calf pain)     No 7. PREGNANCY: "Is there any chance you are pregnant?" "When was your last menstrual period?"     No  Protocols used: Ankle Swelling-A-AH

## 2022-01-06 ENCOUNTER — Ambulatory Visit: Payer: 59 | Admitting: Physician Assistant

## 2022-01-09 ENCOUNTER — Other Ambulatory Visit: Payer: Self-pay | Admitting: Physician Assistant

## 2022-01-09 DIAGNOSIS — I10 Essential (primary) hypertension: Secondary | ICD-10-CM

## 2022-01-10 NOTE — Progress Notes (Signed)
    I,Sha'taria Tyson,acting as a Education administrator for [employer], PA-C.,have documented all relevant documentation on the behalf of [name], PA-C,as directed by  [name], PA-C while in the presence of [name], PA-C.   Acute Office Visit  Subjective:     Patient ID: Joan Johnson, female    DOB: 24-Jul-1971, 51 y.o.   MRN: [id number]  Cc. Ankle swelling  Percival Spanish is a 51 y/o female who presents today with concerns over intermittent bilateral foot and ankle swelling. 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 She will occasionally wear compression socks but when the swelling is present, she is unable to get them on.  Reports the swelling resolves in the morning. Denies pain, pressure, change in sensation, SOB.       Objective:    Blood pressure 131/80, pulse 69, height '5\' 3"'$  (1.6 m), weight 206 lb 14.4 oz (93.8 kg), SpO2 100 %.   Physical Exam Vitals reviewed.  Constitutional:      Appearance: She is not ill-appearing.  HENT:     Head: Normocephalic.  Eyes:     Conjunctiva/sclera: Conjunctivae normal.  Cardiovascular:     Rate and Rhythm: Normal rate.  Pulmonary:     Effort: Pulmonary effort is normal. No respiratory distress.  Musculoskeletal:     Right lower leg: No edema.     Left lower leg: No edema.  Neurological:     General: No focal deficit present.     Mental Status: She is alert and oriented to person, place, and time.  Psychiatric:        Mood and Affect: Mood normal.        Behavior: Behavior normal.     No results found for any visits on 01/11/22.      Assessment & Plan:   Problem List Items Addressed This Visit       Other   Dependent edema - Primary    Intermittent, positional Recommending decreasing salt intake, increasing fluids, and wearing compression socks/keeping feet up        I, [name], PA-C have reviewed all documentation for this visit. The documentation on  01/11/2022  for the exam, diagnosis,  procedures, and orders are all accurate and complete.  [name], PA-C [facility] [address] [place], [place], [address] Office: [phone] Fax: [phone]

## 2022-01-11 ENCOUNTER — Encounter: Payer: Self-pay | Admitting: Physician Assistant

## 2022-01-11 ENCOUNTER — Ambulatory Visit: Payer: 59 | Admitting: Physician Assistant

## 2022-01-11 VITALS — BP 131/80 | HR 69 | Ht 63.0 in | Wt 206.9 lb

## 2022-01-11 DIAGNOSIS — R609 Edema, unspecified: Secondary | ICD-10-CM | POA: Diagnosis not present

## 2022-01-11 NOTE — Assessment & Plan Note (Signed)
Intermittent, positional Recommending decreasing salt intake, increasing fluids, and wearing compression socks/keeping feet up

## 2022-04-05 NOTE — Progress Notes (Unsigned)
I,Joan Johnson,acting as a Education administrator for [employer], PA-C.,have documented all relevant documentation on the behalf of Joan Kirschner, PA-C,as directed by  Joan Kirschner, PA-C while in the presence of Joan Kirschner, PA-C.   Complete physical exam   Patient: Joan Johnson   DOB: 12/10/1970   51 y.o. Female  MRN: [id number] Visit Date: 04/06/2022  Today's healthcare provider: [name], PA-C   No chief complaint on file.  Subjective    [name] is a 51 y.o. female who presents today for a complete physical exam.  She reports consuming a {diet types:17450} diet. {Exercise:19826} She generally feels {well/fairly well/poorly:18703}. She reports sleeping {well/fairly well/poorly:18703}. She {does/does not:200015} have additional problems to discuss today.  HPI  ***  Past Medical History:  Diagnosis Date   Allergy    Anxiety    Arthritis    History of kidney stones    Hypertension    Migraine    Osteopenia 06/2021   DEXA at [facility]; spine and hip   Past Surgical History:  Procedure Laterality Date   ANKLE SURGERY     AUGMENTATION MAMMAPLASTY     Breast Implant   [place]   COLONOSCOPY WITH PROPOFOL N/A 10/01/2020   Procedure: COLONOSCOPY WITH PROPOFOL;  Surgeon: Lin Landsman, MD;  Location: [facility] ENDOSCOPY;  Service: Gastroenterology;  Laterality: N/A;   COSMETIC SURGERY     CYSTOSCOPY W/ RETROGRADES Left 07/19/2021   Procedure: CYSTOSCOPY WITH RETROGRADE PYELOGRAM;  Surgeon: Hollice Espy, MD;  Location: [facility] ORS;  Service: Urology;  Laterality: Left;   CYSTOSCOPY/URETEROSCOPY/HOLMIUM LASER/STENT PLACEMENT Left 07/19/2021   Procedure: CYSTOSCOPY/URETEROSCOPY/HOLMIUM LASER/STENT PLACEMENT;  Surgeon: Hollice Espy, MD;  Location: [facility] ORS;  Service: Urology;  Laterality: Left;   dental implants     ESOPHAGOGASTRODUODENOSCOPY (EGD) WITH PROPOFOL N/A 10/01/2020   Procedure: ESOPHAGOGASTRODUODENOSCOPY (EGD) WITH  PROPOFOL;  Surgeon: Lin Landsman, MD;  Location: [facility] ENDOSCOPY;  Service: Gastroenterology;  Laterality: N/A;   HERNIA REPAIR     OTHER SURGICAL HISTORY     tummy tuck   OTHER SURGICAL HISTORY     carpal tunnel   TUBAL LIGATION     Social History   Socioeconomic History   Marital status: Married    Spouse name: Luis   Number of children: 2   Years of education: Not on file   Highest education level: Not on file  Occupational History   Not on file  Tobacco Use   Smoking status: Former    Packs/day: 0.50    Years: 21.00    Total pack years: 10.50    Types: Cigarettes    Quit date: 03/29/2018    Years since quitting: 4.0   Smokeless tobacco: Never  Vaping Use   Vaping Use: Never used  Substance and Sexual Activity   Alcohol use: Yes    Alcohol/week: 2.0 standard drinks of alcohol    Types: 2 Glasses of wine per week    Comment: weekly   Drug use: Never   Sexual activity: Yes    Birth control/protection: Post-menopausal  Other Topics Concern   Not on file  Social History Narrative   ** Merged History Encounter **       Social Determinants of Health   Financial Resource Strain: Not on file  Food Insecurity: Not on file  Transportation Needs: Not on file  Physical Activity: Not on file  Stress: Not on file  Social Connections: Not on file  Intimate Partner Violence:  Not on file   Family Status  Relation Name Status   Mother  Alive   Daughter  Alive   Family History  Problem Relation Age of Onset   Heart murmur Mother    Thyroid disease Daughter    Allergies  Allergen Reactions   Codeine Itching and Other (See Comments)   Shellfish Allergy Hives   Shellfish-Derived Products Other (See Comments)    Patient Care Team: Joan Kirschner, PA-C as PCP - General (Physician Assistant)   Medications: Outpatient Medications Prior to Visit  Medication Sig   acetaminophen (TYLENOL) 500 MG tablet Take 1,000 mg by mouth every 6 (six) hours as needed.    atorvastatin (LIPITOR) 10 MG tablet Take 1 tablet (10 mg total) by mouth daily.   hydroxychloroquine (PLAQUENIL) 200 MG tablet Take 200 mg by mouth daily.   Krill Oil 350 MG CAPS Take 350 mg by mouth daily.   loratadine (CLARITIN) 10 MG tablet Take 10 mg by mouth daily.   losartan (COZAAR) 50 MG tablet Take 1 tablet (50 mg total) by mouth daily.   meclizine (ANTIVERT) 12.5 MG tablet Take 1 tablet (12.5 mg total) by mouth 2 (two) times daily as needed for dizziness.   No facility-administered medications prior to visit.    Review of Systems  {Labs  Heme  Chem  Endocrine  Serology  Results Review (optional):23779}  Objective    There were no vitals taken for this visit. {Show previous vital signs (optional):23777}   Physical Exam  ***  Last depression screening scores    12/22/2021   10:43 AM 06/03/2021   10:57 AM 10/08/2020    8:27 AM  PHQ 2/9 Scores  PHQ - 2 Score 0 0 0  PHQ- 9 Score 1 0 0   Last fall risk screening    12/22/2021   10:43 AM  Fall Risk   Falls in the past year? 0  Number falls in past yr: 0  Injury with Fall? 0   Last Audit-C alcohol use screening    06/03/2021   10:57 AM  Alcohol Use Disorder Test (AUDIT)  1. How often do you have a drink containing alcohol? 2  2. How many drinks containing alcohol do you have on a typical day when you are drinking? 0  3. How often do you have six or more drinks on one occasion? 0  AUDIT-C Score 2   A score of 3 or more in women, and 4 or more in men indicates increased risk for alcohol abuse, EXCEPT if all of the points are from question 1   No results found for any visits on 04/06/22.  Assessment & Plan    Routine Health Maintenance and Physical Exam  Exercise Activities and Dietary recommendations  Goals   None     Immunization History  Administered Date(s) Administered   Influenza,inj,quad, With Preservative 04/01/2017   Influenza-Unspecified 08/03/2020   Moderna Sars-Covid-2 Vaccination  08/12/2019, 09/09/2019   Tdap 05/16/2019    Health Maintenance  Topic Date Due   COVID-19 Vaccine (3 - Moderna risk series) 10/07/2019   INFLUENZA VACCINE  03/01/2022   Zoster Vaccines- Shingrix (1 of 2) 04/13/2022 (Originally 02/14/1989)   Hepatitis C Screening  01/12/2023 (Originally 02/15/1988)   HIV Screening  01/12/2023 (Originally 02/14/1985)   MAMMOGRAM  07/01/2023   PAP SMEAR-Modifier  06/17/2026   COLONOSCOPY (Pts 45-27yr Insurance coverage will need to be confirmed)  10/02/2027   TETANUS/TDAP  05/15/2029   HPV VACCINES  Aged Out  Discussed health benefits of physical activity, and encouraged her to engage in regular exercise appropriate for her age and condition.  ***  No follow-ups on file.     {provider attestation***:1}   Joan Kirschner, PA-C  [facility] [phone] (phone) [phone] (fax)  [place]

## 2022-04-06 ENCOUNTER — Encounter: Payer: Self-pay | Admitting: Physician Assistant

## 2022-04-06 ENCOUNTER — Ambulatory Visit (INDEPENDENT_AMBULATORY_CARE_PROVIDER_SITE_OTHER): Payer: 59 | Admitting: Physician Assistant

## 2022-04-06 VITALS — BP 121/61 | HR 71

## 2022-04-06 DIAGNOSIS — R079 Chest pain, unspecified: Secondary | ICD-10-CM | POA: Insufficient documentation

## 2022-04-06 DIAGNOSIS — R739 Hyperglycemia, unspecified: Secondary | ICD-10-CM | POA: Diagnosis not present

## 2022-04-06 DIAGNOSIS — M059 Rheumatoid arthritis with rheumatoid factor, unspecified: Secondary | ICD-10-CM

## 2022-04-06 DIAGNOSIS — I10 Essential (primary) hypertension: Secondary | ICD-10-CM | POA: Diagnosis not present

## 2022-04-06 DIAGNOSIS — Z0000 Encounter for general adult medical examination without abnormal findings: Secondary | ICD-10-CM | POA: Diagnosis not present

## 2022-04-06 DIAGNOSIS — E782 Mixed hyperlipidemia: Secondary | ICD-10-CM | POA: Diagnosis not present

## 2022-04-06 DIAGNOSIS — Z91013 Allergy to seafood: Secondary | ICD-10-CM

## 2022-04-06 DIAGNOSIS — F419 Anxiety disorder, unspecified: Secondary | ICD-10-CM | POA: Insufficient documentation

## 2022-04-06 DIAGNOSIS — K219 Gastro-esophageal reflux disease without esophagitis: Secondary | ICD-10-CM

## 2022-04-06 DIAGNOSIS — F418 Other specified anxiety disorders: Secondary | ICD-10-CM

## 2022-04-06 MED ORDER — EPINEPHRINE 0.3 MG/0.3ML IJ SOAJ: 0.3000 mg | INTRAMUSCULAR | 0 refills | Status: AC | PRN

## 2022-04-06 MED ORDER — ALPRAZOLAM 0.25 MG PO TABS: 0.2500 mg | ORAL_TABLET | Freq: Two times a day (BID) | ORAL | 0 refills | Status: DC | PRN

## 2022-04-06 NOTE — Assessment & Plan Note (Deleted)
Historically but no bone density available for review

## 2022-04-06 NOTE — Assessment & Plan Note (Signed)
When on planes. rx xanax 0.25 mg prn plane travel

## 2022-04-06 NOTE — Assessment & Plan Note (Signed)
Well controlled continue current medications Ordered cmp

## 2022-04-06 NOTE — Assessment & Plan Note (Addendum)
Will recheck lipids fasting, currently on lipitor 10 mg  The 10-year ASCVD risk score (Arnett DK, et al., 2019) is: 2.4%

## 2022-04-06 NOTE — Assessment & Plan Note (Signed)
Well controlled 

## 2022-04-06 NOTE — Assessment & Plan Note (Signed)
Ref to cardiology d/t unusual presentation, h/o family heart disease, risk factors of HLD and HTN  Not currently in pain, asymptomatic and vitals stable did not find need for ekg

## 2022-04-06 NOTE — Patient Instructions (Addendum)
[place], MD [address], [place]  [place], [place] [address]  Main: [phone]  Fax: [phone]

## 2022-04-06 NOTE — Assessment & Plan Note (Signed)
Followed by rheum. 

## 2022-04-08 LAB — CBC WITH DIFFERENTIAL/PLATELET
Basophils Absolute: 0.1 10*3/uL (ref 0.0–0.2)
Basos: 1 %
EOS (ABSOLUTE): 0.3 10*3/uL (ref 0.0–0.4)
Eos: 5 %
Hematocrit: 41.1 % (ref 34.0–46.6)
Hemoglobin: 13.8 g/dL (ref 11.1–15.9)
Immature Grans (Abs): 0 10*3/uL (ref 0.0–0.1)
Immature Granulocytes: 0 %
Lymphocytes Absolute: 1.8 10*3/uL (ref 0.7–3.1)
Lymphs: 32 %
MCH: 31 pg (ref 26.6–33.0)
MCHC: 33.6 g/dL (ref 31.5–35.7)
MCV: 92 fL (ref 79–97)
Monocytes Absolute: 0.5 10*3/uL (ref 0.1–0.9)
Monocytes: 8 %
Neutrophils Absolute: 3 10*3/uL (ref 1.4–7.0)
Neutrophils: 54 %
Platelets: 262 10*3/uL (ref 150–450)
RBC: 4.45 x10E6/uL (ref 3.77–5.28)
RDW: 12.6 % (ref 11.7–15.4)
WBC: 5.6 10*3/uL (ref 3.4–10.8)

## 2022-04-08 LAB — COMPREHENSIVE METABOLIC PANEL
ALT: 20 IU/L (ref 0–32)
AST: 15 IU/L (ref 0–40)
Albumin/Globulin Ratio: 1.8 (ref 1.2–2.2)
Albumin: 4.4 g/dL (ref 3.8–4.9)
Alkaline Phosphatase: 73 IU/L (ref 44–121)
BUN/Creatinine Ratio: 24 — ABNORMAL HIGH (ref 9–23)
BUN: 17 mg/dL (ref 6–24)
Bilirubin Total: 1 mg/dL (ref 0.0–1.2)
CO2: 21 mmol/L (ref 20–29)
Calcium: 9.4 mg/dL (ref 8.7–10.2)
Chloride: 105 mmol/L (ref 96–106)
Creatinine, Ser: 0.7 mg/dL (ref 0.57–1.00)
Globulin, Total: 2.4 g/dL (ref 1.5–4.5)
Glucose: 100 mg/dL — ABNORMAL HIGH (ref 70–99)
Potassium: 4.6 mmol/L (ref 3.5–5.2)
Sodium: 141 mmol/L (ref 134–144)
Total Protein: 6.8 g/dL (ref 6.0–8.5)
eGFR: 104 mL/min/{1.73_m2} (ref >59)

## 2022-04-08 LAB — LIPID PANEL
Chol/HDL Ratio: 3.7 ratio (ref 0.0–4.4)
Cholesterol, Total: 199 mg/dL (ref 100–199)
HDL: 54 mg/dL (ref >39)
LDL Chol Calc (NIH): 126 mg/dL — ABNORMAL HIGH (ref 0–99)
Triglycerides: 107 mg/dL (ref 0–149)
VLDL Cholesterol Cal: 19 mg/dL (ref 5–40)

## 2022-04-08 LAB — TSH: TSH: 1.79 u[IU]/mL (ref 0.450–4.500)

## 2022-04-08 LAB — HEMOGLOBIN A1C
Est. average glucose Bld gHb Est-mCnc: 105 mg/dL
Hgb A1c MFr Bld: 5.3 % (ref 4.8–5.6)

## 2022-04-08 MED ORDER — ROSUVASTATIN CALCIUM 10 MG PO TABS: 10.0000 mg | ORAL_TABLET | Freq: Every day | ORAL | 1 refills | Status: DC

## 2022-04-08 NOTE — Addendum Note (Signed)
Addended by: Barnie Mort on: 04/08/2022 01:16 PM   Modules accepted: Orders

## 2022-04-28 ENCOUNTER — Encounter: Payer: Self-pay | Admitting: Physician Assistant

## 2022-04-28 ENCOUNTER — Ambulatory Visit: Payer: 59 | Admitting: Physician Assistant

## 2022-04-28 VITALS — BP 109/76 | HR 91 | Temp 97.7°F | Resp 16 | Wt 211.0 lb

## 2022-04-28 DIAGNOSIS — K219 Gastro-esophageal reflux disease without esophagitis: Secondary | ICD-10-CM

## 2022-04-28 DIAGNOSIS — R739 Hyperglycemia, unspecified: Secondary | ICD-10-CM | POA: Diagnosis not present

## 2022-04-28 DIAGNOSIS — M059 Rheumatoid arthritis with rheumatoid factor, unspecified: Secondary | ICD-10-CM

## 2022-04-28 NOTE — Progress Notes (Signed)
I,Roshena L Chambers,acting as a Education administrator for [employer], PA-C.,have documented all relevant documentation on the behalf of Mardene Speak, PA-C,as directed by  [employer], PA-C while in the presence of [employer], PA-C.   Established patient visit   Patient: Joan Johnson   DOB: 1970/09/08   51 y.o. Female  MRN: [id number] Visit Date: 04/28/2022  Today's healthcare provider: Mardene Speak, PA-C   Chief Complaint  Patient presents with   Joint Swelling   Subjective    HPI  Finger swelling: Patient complains of swelling of the 1st finger on her right hand. Swelling started 2 days ago. Associated symptoms includes finger pain with movement. Patient reports having a history of Rheumatoid arthritis.   Medications: Outpatient Medications Prior to Visit  Medication Sig   acetaminophen (TYLENOL) 500 MG tablet Take 1,000 mg by mouth every 6 (six) hours as needed.   ALPRAZolam (XANAX) 0.25 MG tablet Take 1 tablet (0.25 mg total) by mouth 2 (two) times daily as needed for anxiety.   EPINEPHrine 0.3 mg/0.3 mL IJ SOAJ injection Inject 0.3 mg into the muscle as needed for anaphylaxis.   hydroxychloroquine (PLAQUENIL) 200 MG tablet Take 200 mg by mouth daily.   Krill Oil 350 MG CAPS Take 350 mg by mouth daily.   loratadine (CLARITIN) 10 MG tablet Take 10 mg by mouth daily.   rosuvastatin (CRESTOR) 10 MG tablet Take 1 tablet (10 mg total) by mouth daily.   losartan (COZAAR) 50 MG tablet Take 1 tablet (50 mg total) by mouth daily.   No facility-administered medications prior to visit.    Review of Systems  Constitutional:  Negative for appetite change, chills, fatigue and fever.  Respiratory:  Negative for chest tightness and shortness of breath.   Cardiovascular:  Negative for chest pain and palpitations.  Gastrointestinal:  Negative for abdominal pain, nausea and vomiting.  Neurological:  Negative for dizziness and weakness.       Objective    BP 109/76 (BP Location:  Left Arm)   Pulse 91   Temp 97.7 F (36.5 C) (Oral)   Resp 16   Wt 211 lb (95.7 kg)   SpO2 100% Comment: room air  BMI 37.38 kg/m    Physical Exam Vitals reviewed.  Constitutional:      General: She is not in acute distress.    Appearance: Normal appearance. She is well-developed. She is obese.  HENT:     Head: Normocephalic and atraumatic.  Eyes:     General: No scleral icterus.    Conjunctiva/sclera: Conjunctivae normal.  Cardiovascular:     Rate and Rhythm: Normal rate and regular rhythm.  Pulmonary:     Effort: Pulmonary effort is normal. No respiratory distress.  Musculoskeletal:        General: Swelling (mild, proximal joint of the R index) present. Normal range of motion.     Cervical back: Normal range of motion.  Skin:    General: Skin is warm and dry.     Findings: No rash.  Neurological:     General: No focal deficit present.     Mental Status: She is alert and oriented to person, place, and time. Mental status is at baseline.  Psychiatric:        Behavior: Behavior normal.        Thought Content: Thought content normal.        Judgment: Judgment normal.       No results found for any visits on 04/28/22.  Assessment & Plan      Rheumatoid arthritis with positive rheumatoid factor, involving unspecified site ([place]) On Hydroxychloroquine Per pt,  resolved. No erythema. Slight tenderness and slight swelling present of proximal joint of the digitus II Advised to contact her Rheumatology for FU if appropriate For a flare, pt was advised  a short-term glucocorticoid at the lowest possible dose and for the shortest possible duration. Pt declined XR was deferred by pt as well Pt refused PT, OT referral Per pt, she did Xrs just recently. Saw Rheumatology 6 mo ago and was recommended to FU q 3 mo, per chart review Well-balanced diet, regular physical activity, good dental hygiene  recommended   Gastroesophageal reflux disease, unspecified whether esophagitis  present Cannot take NSAIDs    The patient was advised to call back or seek an in-person evaluation if the symptoms worsen or if the condition fails to improve as anticipated.  I discussed the assessment and treatment plan with the patient. The patient was provided an opportunity to ask questions and all were answered. The patient agreed with the plan and demonstrated an understanding of the instructions.  The entirety of the information documented in the History of Present Illness, Review of Systems and Physical Exam were personally obtained by me. Portions of this information were initially documented by the CMA and reviewed by me for thoroughness and accuracy.  Portions of this note were created using dictation software and may contain typographical errors.     Mardene Speak, PA-C  [facility] [phone] (phone) [phone] (fax)  [place]

## 2022-06-07 ENCOUNTER — Ambulatory Visit: Payer: Self-pay

## 2022-06-07 NOTE — Telephone Encounter (Signed)
  Chief Complaint: left hip pain  Symptoms: pain radiates to the fron and lower back Frequency: chronic but worsening Pertinent Negatives: Patient denies fever, rash, pain down leg Disposition: '[]'$ ED /'[]'$ Urgent Care (no appt availability in office) / '[]'$ Appointment(In office/virtual)/ '[]'$  [place] [facility]/ '[]'$ Home Care/ '[x]'$ Refused Recommended Disposition /'[]'$ [place] [facility]/ '[]'$  Follow-up with PCP Additional Notes: pt stated that she wants xray first and that it was a "waste of time" to have appt then xray. Please call pt back with providers response. Advised will send to PCP nurse and call her back Reason for Disposition  Hip pain is a chronic symptom (recurrent or ongoing AND present > 4 weeks)  Answer Assessment - Initial Assessment Questions 1. LOCATION and RADIATION: "Where is the pain located?"      Left hip and radites to center of back and to the lower pelvis 2. QUALITY: "What does the pain feel like?"  (e.g., sharp, dull, aching, burning)     Could no described  3. SEVERITY: "How bad is the pain?" "What does it keep you from doing?"   (Scale 1-10; or mild, moderate, severe)   -  MILD (1-3): doesn't interfere with normal activities    -  MODERATE (4-7): interferes with normal activities (e.g., work or school) or awakens from sleep, limping    -  SEVERE (8-10): excruciating pain, unable to do any normal activities, unable to walk     Severe in bed-  4. ONSET: "When did the pain start?" "Does it come and go, or is it there all the time?"     Chronic worsenes lately- comes and goes 5. WORK OR EXERCISE: "Has there been any recent work or exercise that involved this part of the body?"      Walking  sitting standing 6. CAUSE: "What do you think is causing the hip pain?"      unsure 7. AGGRAVATING FACTORS: "What makes the hip pain worse?" (e.g., walking, climbing stairs, running)     Walking, laying in bed severe pain 8. OTHER SYMPTOMS: "Do you have any other symptoms?" (e.g.,  back pain, pain shooting down leg,  fever, rash)     Back pain, radiating to front and lower back  Protocols used: Hip Pain-A-AH

## 2022-06-08 NOTE — Telephone Encounter (Signed)
Called patient to review message from PCP and schedule appt. No answer, LVMTCB [phone]

## 2022-06-08 NOTE — Telephone Encounter (Signed)
Called patient to schedule appt. LMTCB. Okay for PEC to advise.

## 2022-06-09 NOTE — Telephone Encounter (Signed)
Called, spoke with patient in regard to scheduling OV for hip pain. Schedule patient OV, patient verbal under standing.

## 2022-06-20 NOTE — Progress Notes (Unsigned)
      I,Sha'taria Tyson,acting as a Education administrator for [employer], PA-C.,have documented all relevant documentation on the behalf of Joan Kirschner, PA-C,as directed by  Joan Kirschner, PA-C while in the presence of Joan Kirschner, PA-C.  Established patient visit   Patient: Joan Johnson   DOB: 1971-03-06   51 y.o. Female  MRN: [id number] Visit Date: 06/21/2022  Today's healthcare provider: [name], PA-C   No chief complaint on file.  Subjective    HPI  ***  Medications: Outpatient Medications Prior to Visit  Medication Sig   acetaminophen (TYLENOL) 500 MG tablet Take 1,000 mg by mouth every 6 (six) hours as needed.   ALPRAZolam (XANAX) 0.25 MG tablet Take 1 tablet (0.25 mg total) by mouth 2 (two) times daily as needed for anxiety.   EPINEPHrine 0.3 mg/0.3 mL IJ SOAJ injection Inject 0.3 mg into the muscle as needed for anaphylaxis.   hydroxychloroquine (PLAQUENIL) 200 MG tablet Take 200 mg by mouth daily.   Krill Oil 350 MG CAPS Take 350 mg by mouth daily.   loratadine (CLARITIN) 10 MG tablet Take 10 mg by mouth daily.   losartan (COZAAR) 50 MG tablet Take 1 tablet (50 mg total) by mouth daily.   rosuvastatin (CRESTOR) 10 MG tablet Take 1 tablet (10 mg total) by mouth daily.   No facility-administered medications prior to visit.    Review of Systems  {Labs  Heme  Chem  Endocrine  Serology  Results Review (optional):23779}   Objective    There were no vitals taken for this visit. {Show previous vital signs (optional):23777}  Physical Exam  ***  No results found for any visits on 06/21/22.  Assessment & Plan     ***  No follow-ups on file.      {provider attestation***:1}   Joan Kirschner, PA-C  [facility] [phone] (phone) [phone] (fax)  [place]

## 2022-06-21 ENCOUNTER — Encounter: Payer: Self-pay | Admitting: Physician Assistant

## 2022-06-21 ENCOUNTER — Ambulatory Visit: Payer: 59 | Admitting: Physician Assistant

## 2022-06-21 VITALS — BP 126/79 | HR 75 | Wt 214.1 lb

## 2022-06-21 DIAGNOSIS — F419 Anxiety disorder, unspecified: Secondary | ICD-10-CM | POA: Diagnosis not present

## 2022-06-21 DIAGNOSIS — G8929 Other chronic pain: Secondary | ICD-10-CM | POA: Insufficient documentation

## 2022-06-21 DIAGNOSIS — M545 Low back pain, unspecified: Secondary | ICD-10-CM | POA: Diagnosis not present

## 2022-06-21 NOTE — Assessment & Plan Note (Addendum)
Advised pt her prescribed xanax was only to be used for plane travel.  Advised if she feels anxious symptoms more frequently we can discuss therapy, alternative medication. I will not prescribe additional xanax at this time
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
## 2022-07-18 ENCOUNTER — Encounter: Payer: Self-pay | Admitting: Physical Therapy

## 2022-07-18 ENCOUNTER — Ambulatory Visit: Payer: 59 | Attending: Physician Assistant | Admitting: Physical Therapy

## 2022-07-18 DIAGNOSIS — M545 Low back pain, unspecified: Secondary | ICD-10-CM | POA: Insufficient documentation

## 2022-07-18 DIAGNOSIS — M5459 Other low back pain: Secondary | ICD-10-CM | POA: Insufficient documentation

## 2022-07-18 DIAGNOSIS — G8929 Other chronic pain: Secondary | ICD-10-CM | POA: Diagnosis not present

## 2022-07-21 ENCOUNTER — Ambulatory Visit: Payer: 59 | Admitting: Obstetrics and Gynecology

## 2022-08-05 ENCOUNTER — Ambulatory Visit: Payer: 59

## 2022-08-12 ENCOUNTER — Encounter: Payer: 59 | Admitting: Physical Therapy

## 2022-08-19 ENCOUNTER — Encounter: Payer: 59 | Admitting: Physical Therapy

## 2022-08-26 ENCOUNTER — Encounter: Payer: 59 | Admitting: Physical Therapy

## 2022-08-26 ENCOUNTER — Other Ambulatory Visit: Payer: Self-pay

## 2022-08-29 ENCOUNTER — Encounter: Payer: Self-pay | Admitting: Gastroenterology

## 2022-08-29 ENCOUNTER — Ambulatory Visit: Payer: 59 | Admitting: Gastroenterology

## 2022-08-29 VITALS — BP 124/77 | HR 85 | Temp 97.8°F | Ht 63.0 in | Wt 204.4 lb

## 2022-08-29 DIAGNOSIS — K640 First degree hemorrhoids: Secondary | ICD-10-CM

## 2022-08-29 NOTE — Progress Notes (Signed)
Cephas Darby, MD [address]  [place]  [place], [place] [address]  Main: [phone]  Fax: [phone]    Gastroenterology Consultation  Referring Provider:     [name], PA-C Primary Care Physician:  [name], PA-C Primary Gastroenterologist:  Dr. Cephas Darby Reason for Consultation: Symptomatic hemorrhoids        HPI:   Joan Johnson is a 52 y.o. female referred by [name], PA-C  for consultation & management of symptomatic hemorrhoids.  Patient reports that for the last several years, she has been experiencing hemorrhoidal symptoms including rectal pressure, intermittent bright red blood per rectum, dripping into the toilet bowel, rectal pain as well as perianal itching.  She does notice soft tissue around the anal opening which prevents from cleaning properly.  She has used over-the-counter hemorrhoidal creams with temporary relief only.  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  She reports that her bowel movements are soft but that she has to strain.  She is on a weight loss program and also on Ozempic, lost about 10 pounds.  She does not smoke, occasional alcohol use  NSAIDs: None  Antiplts/Anticoagulants/Anti thrombotics: None  GI Procedures:  Patient underwent colonoscopy on 10/01/2020, excellent bowel prep, found to have 2 subcentimeter polyps, retroflexion in the rectum was normal DIAGNOSIS:  A. COLON POLYP, ASCENDING; COLD BIOPSY:  - TUBULAR ADENOMA.  - NEGATIVE FOR HIGH-GRADE DYSPLASIA AND MALIGNANCY.   B.  COLON POLYP, RECTOSIGMOID; COLD BIOPSY:  - HYPERPLASTIC POLYP.  - NEGATIVE FOR DYSPLASIA AND MALIGNANCY.   She denies family history of GI malignancy  Past Medical History:  Diagnosis Date   Allergy    Anxiety    Arthritis    History of kidney stones    Hypertension    Migraine    Osteopenia 06/2021   DEXA at [facility]; spine and hip    Past Surgical History:   Procedure Laterality Date   [place]   COLONOSCOPY WITH PROPOFOL N/A 10/01/2020   Procedure: COLONOSCOPY WITH PROPOFOL;  Surgeon: Lin Landsman, MD;  Location: [facility] ENDOSCOPY;  Service: Gastroenterology;  Laterality: N/A;   COSMETIC SURGERY     CYSTOSCOPY W/ RETROGRADES Left 07/19/2021   Procedure: CYSTOSCOPY WITH RETROGRADE PYELOGRAM;  Surgeon: Hollice Espy, MD;  Location: [facility] ORS;  Service: Urology;  Laterality: Left;   CYSTOSCOPY/URETEROSCOPY/HOLMIUM LASER/STENT PLACEMENT Left 07/19/2021   Procedure: CYSTOSCOPY/URETEROSCOPY/HOLMIUM LASER/STENT PLACEMENT;  Surgeon: Hollice Espy, MD;  Location: [facility] ORS;  Service: Urology;  Laterality: Left;   dental implants     ESOPHAGOGASTRODUODENOSCOPY (EGD) WITH PROPOFOL N/A 10/01/2020   Procedure: ESOPHAGOGASTRODUODENOSCOPY (EGD) WITH PROPOFOL;  Surgeon: Lin Landsman, MD;  Location: [facility] ENDOSCOPY;  Service: Gastroenterology;  Laterality: N/A;   HERNIA REPAIR     OTHER SURGICAL HISTORY     tummy tuck   OTHER SURGICAL HISTORY     carpal tunnel   TUBAL LIGATION      Current Outpatient Medications:    acetaminophen (TYLENOL) 500 MG tablet, Take 1,000 mg by mouth every 6 (six) hours as needed., Disp: , Rfl:    ALPRAZolam (XANAX) 0.25 MG tablet, Take 1 tablet (0.25 mg total) by mouth 2 (two) times daily as needed for anxiety., Disp: 20 tablet, Rfl: 0   EPINEPHrine 0.3 mg/0.3 mL IJ SOAJ injection, Inject 0.3 mg  into the muscle as needed for anaphylaxis., Disp: 2 each, Rfl: 0   hydroxychloroquine (PLAQUENIL) 200 MG tablet, Take 200 mg by mouth daily., Disp: , Rfl:    loratadine (CLARITIN) 10 MG tablet, Take 10 mg by mouth daily., Disp: , Rfl:    losartan (COZAAR) 50 MG tablet, Take 1 tablet (50 mg total) by mouth daily., Disp: 90 tablet, Rfl: 2   phentermine 15 MG capsule, Take 15 mg by mouth daily., Disp: , Rfl:    rosuvastatin (CRESTOR)  10 MG tablet, Take 1 tablet (10 mg total) by mouth daily., Disp: 90 tablet, Rfl: 1   topiramate (TOPAMAX) 25 MG tablet, Take 25 mg by mouth daily., Disp: , Rfl:    Family History  Problem Relation Age of Onset   Heart murmur Mother    Thyroid disease Daughter      Social History   Tobacco Use   Smoking status: Former    Packs/day: 0.50    Years: 21.00    Total pack years: 10.50    Types: Cigarettes    Quit date: 03/29/2018    Years since quitting: 4.4   Smokeless tobacco: Never  Vaping Use   Vaping Use: Never used  Substance Use Topics   Alcohol use: Yes    Alcohol/week: 2.0 standard drinks of alcohol    Types: 2 Glasses of wine per week    Comment: weekly   Drug use: Never    Allergies as of 08/29/2022 - Review Complete 08/29/2022  Allergen Reaction Noted   Codeine Itching and Other (See Comments) 06/30/2021   Shellfish allergy Hives 05/20/2020   Shellfish-derived products Other (See Comments) 12/20/2021    Review of Systems:    All systems reviewed and negative except where noted in HPI.   Physical Exam:  BP 124/77 (BP Location: Left Arm, Patient Position: Sitting, Cuff Size: Normal)   Pulse 85   Temp 97.8 F (36.6 C)   Ht '5\' 3"'$  (1.6 m)   Wt 204 lb 6 oz (92.7 kg)   BMI 36.20 kg/m  No LMP recorded. Patient is postmenopausal.  General:   Alert,  Well-developed, well-nourished, pleasant and cooperative in NAD Head:  Normocephalic and atraumatic. Eyes:  Sclera clear, no icterus.   Conjunctiva pink. Ears:  Normal auditory acuity. Nose:  No deformity, discharge, or lesions. Mouth:  No deformity or lesions,oropharynx pink & moist. Neck:  Supple; no masses or thyromegaly. Lungs:  Respirations even and unlabored.  Clear throughout to auscultation.   No wheezes, crackles, or rhonchi. No acute distress. Heart:  Regular rate and rhythm; no murmurs, clicks, rubs, or gallops. Abdomen:  Normal bowel sounds. Soft, non-tender and non-distended without masses,  hepatosplenomegaly or hernias noted.  No guarding or rebound tenderness.   Rectal: Multiple perianal skin tags, nontender digital rectal exam, soft brown stool, anoscopy revealed external hemorrhoids only Msk:  Symmetrical without gross deformities. Good, equal movement & strength bilaterally. Pulses:  Normal pulses noted. Extremities:  No clubbing or edema.  No cyanosis. Neurologic:  Alert and oriented x3;  grossly normal neurologically. Skin:  Intact without significant lesions or rashes. No jaundice. Psych:  Alert and cooperative. Normal mood and affect.  Imaging Studies: Reviewed  Assessment and Plan:   Joan Johnson is a 52 y.o. female with history of hypertension, obesity, osteoarthritis, osteopenia is seen in consultation for hemorrhoidal symptoms  Grade 1 external hemorrhoids Discussed about outpatient hemorrhoid ligation, the procedure, risks and benefits Consent obtained Proceed with hemorrhoid ligation today Discussed about high-fiber  diet, fiber supplements, information provided  Follow up in 2 weeks  Cephas Darby, MD

## 2022-08-29 NOTE — Progress Notes (Unsigned)
PCP: [name], PA-C   No chief complaint on file.   HPI:      [name] is a 52 y.o. G3P3000 whose LMP was No LMP recorded. Patient is postmenopausal., presents today for her annual examination.  Her menses are absent due to menopause; pt states LMP was in her 30s after IVF tx, never did HRT. No PMB. She has been having occas vasomotor sx recently but had gone yrs without any sx.  Sex activity: single partner, contraception - post menopausal status. She does have vaginal dryness and dyspareunia, not improved with lubricants. Has never done vag ERT.  Last Pap: 06/1721; Results were: no abnormalities /neg HPV DNA. No hx of abn paps.   Last mammogram: 1130/22 at [facility]; Results were: normal--routine follow-up in 12 months There is no FH of breast cancer. There is no FH of ovarian cancer. The patient does do self-breast exams.  Colonoscopy: 3/22 with [facility] with polyps; Repeat due after 7 years.  [facility]: 11/[facility] at [facility]; osteopenia in spine/hip   Tobacco use: The patient denies current or previous tobacco use. Alcohol use: social drinker No drug use Exercise: min active  She does get adequate calcium but not Vitamin D in her diet.  Labs with PCP.   Patient Active Problem List   Diagnosis Date Noted   Chronic bilateral low back pain without sciatica 06/21/2022   Anxiety 04/06/2022   Chest pain 04/06/2022   Dependent edema 01/11/2022   Diarrhea 12/22/2021   Vertigo 09/30/2021   Post-nasal drip 09/30/2021   Premature menopause 06/17/2021   Osteopenia 06/17/2021   TMJ (temporomandibular joint syndrome) 06/03/2021   Nasal congestion 06/03/2021   Mixed hyperlipidemia 06/03/2021   Memory changes 06/03/2021   Hair thinning 09/01/2020   Hypertension 09/01/2020   Hx of migraines 07/29/2020   Gastroesophageal reflux disease 07/29/2020   Colon cancer screening 07/29/2020   Vitamin D deficiency 07/29/2020   Lung nodule 07/29/2020   Rheumatoid arthritis with  positive rheumatoid factor ([employer]) 10/17/2017   BMI 36.0-36.9,adult 09/19/2017    Past Surgical History:  Procedure Laterality Date   ANKLE SURGERY     AUGMENTATION MAMMAPLASTY     Breast Implant   [place]   COLONOSCOPY WITH PROPOFOL N/A 10/01/2020   Procedure: COLONOSCOPY WITH PROPOFOL;  Surgeon: Lin Landsman, MD;  Location: [facility] ENDOSCOPY;  Service: Gastroenterology;  Laterality: N/A;   COSMETIC SURGERY     CYSTOSCOPY W/ RETROGRADES Left 07/19/2021   Procedure: CYSTOSCOPY WITH RETROGRADE PYELOGRAM;  Surgeon: Hollice Espy, MD;  Location: [facility] ORS;  Service: Urology;  Laterality: Left;   CYSTOSCOPY/URETEROSCOPY/HOLMIUM LASER/STENT PLACEMENT Left 07/19/2021   Procedure: CYSTOSCOPY/URETEROSCOPY/HOLMIUM LASER/STENT PLACEMENT;  Surgeon: Hollice Espy, MD;  Location: [facility] ORS;  Service: Urology;  Laterality: Left;   dental implants     ESOPHAGOGASTRODUODENOSCOPY (EGD) WITH PROPOFOL N/A 10/01/2020   Procedure: ESOPHAGOGASTRODUODENOSCOPY (EGD) WITH PROPOFOL;  Surgeon: Lin Landsman, MD;  Location: [facility] ENDOSCOPY;  Service: Gastroenterology;  Laterality: N/A;   HERNIA REPAIR     OTHER SURGICAL HISTORY     tummy tuck   OTHER SURGICAL HISTORY     carpal tunnel   TUBAL LIGATION      Family History  Problem Relation Age of Onset   Heart murmur Mother    Thyroid disease Daughter     Social History   Socioeconomic History   Marital status: Married    Spouse name: Cassandria Johnson   Number of children: 2  Years of education: Not on file   Highest education level: Not on file  Occupational History   Not on file  Tobacco Use   Smoking status: Former    Packs/day: 0.50    Years: 21.00    Total pack years: 10.50    Types: Cigarettes    Quit date: 03/29/2018    Years since quitting: 4.4   Smokeless tobacco: Never  Vaping Use   Vaping Use: Never used  Substance and Sexual Activity   Alcohol use: Yes    Alcohol/week: 2.0 standard drinks of alcohol     Types: 2 Glasses of wine per week    Comment: weekly   Drug use: Never   Sexual activity: Yes    Birth control/protection: Post-menopausal  Other Topics Concern   Not on file  Social History Narrative   ** Merged History Encounter **       Social Determinants of Health   Financial Resource Strain: Not on file  Food Insecurity: Not on file  Transportation Needs: Not on file  Physical Activity: Not on file  Stress: Not on file  Social Connections: Not on file  Intimate Partner Violence: Not on file     Current Outpatient Medications:    acetaminophen (TYLENOL) 500 MG tablet, Take 1,000 mg by mouth every 6 (six) hours as needed., Disp: , Rfl:    ALPRAZolam (XANAX) 0.25 MG tablet, Take 1 tablet (0.25 mg total) by mouth 2 (two) times daily as needed for anxiety., Disp: 20 tablet, Rfl: 0   EPINEPHrine 0.3 mg/0.3 mL IJ SOAJ injection, Inject 0.3 mg into the muscle as needed for anaphylaxis., Disp: 2 each, Rfl: 0   hydroxychloroquine (PLAQUENIL) 200 MG tablet, Take 200 mg by mouth daily., Disp: , Rfl:    loratadine (CLARITIN) 10 MG tablet, Take 10 mg by mouth daily., Disp: , Rfl:    losartan (COZAAR) 50 MG tablet, Take 1 tablet (50 mg total) by mouth daily., Disp: 90 tablet, Rfl: 2   phentermine 15 MG capsule, Take 15 mg by mouth daily., Disp: , Rfl:    rosuvastatin (CRESTOR) 10 MG tablet, Take 1 tablet (10 mg total) by mouth daily., Disp: 90 tablet, Rfl: 1   topiramate (TOPAMAX) 25 MG tablet, Take 25 mg by mouth daily., Disp: , Rfl:      ROS:  Review of Systems  Constitutional:  Negative for fatigue, fever and unexpected weight change.  Respiratory:  Negative for cough, shortness of breath and wheezing.   Cardiovascular:  Negative for chest pain, palpitations and leg swelling.  Gastrointestinal:  Negative for blood in stool, constipation, diarrhea, nausea and vomiting.  Endocrine: Negative for cold intolerance, heat intolerance and polyuria.  Genitourinary:  Positive for  dyspareunia. Negative for dysuria, flank pain, frequency, genital sores, hematuria, menstrual problem, pelvic pain, urgency, vaginal bleeding, vaginal discharge and vaginal pain.  Musculoskeletal:  Negative for back pain, joint swelling and myalgias.  Skin:  Negative for rash.  Neurological:  Negative for dizziness, syncope, light-headedness, numbness and headaches.  Hematological:  Negative for adenopathy.  Psychiatric/Behavioral:  Negative for agitation, confusion, sleep disturbance and suicidal ideas. The patient is not nervous/anxious.    BREAST: No symptoms    Objective: There were no vitals taken for this visit.   Physical Exam Constitutional:      Appearance: She is well-developed.  Genitourinary:     Vulva normal.     Right Labia: No rash, tenderness or lesions.    Left Labia: No tenderness, lesions or  rash.    No vaginal discharge, erythema or tenderness.     No vaginal atrophy present.     Right Adnexa: not tender and no mass present.    Left Adnexa: not tender and no mass present.    No cervical friability or polyp.     Uterus is not enlarged or tender.  Breasts:    Right: No mass, nipple discharge, skin change or tenderness.     Left: No mass, nipple discharge, skin change or tenderness.  Neck:     Thyroid: No thyromegaly.  Cardiovascular:     Rate and Rhythm: Normal rate and regular rhythm.     Heart sounds: Normal heart sounds. No murmur heard. Pulmonary:     Effort: Pulmonary effort is normal.     Breath sounds: Normal breath sounds.  Abdominal:     Palpations: Abdomen is soft.     Tenderness: There is no abdominal tenderness. There is no guarding or rebound.  Musculoskeletal:        General: Normal range of motion.     Cervical back: Normal range of motion.  Lymphadenopathy:     Cervical: No cervical adenopathy.  Neurological:     General: No focal deficit present.     Mental Status: She is alert and oriented to person, place, and time.     Cranial  Nerves: No cranial nerve deficit.  Skin:    General: Skin is warm and dry.  Psychiatric:        Mood and Affect: Mood normal.        Behavior: Behavior normal.        Thought Content: Thought content normal.        Judgment: Judgment normal.  Vitals reviewed.     Assessment/Plan:  Encounter for annual routine gynecological examination  Cervical cancer screening - Plan: Cytology - PAP  Screening for HPV (human papillomavirus) - Plan: Cytology - PAP  Encounter for screening mammogram for malignant neoplasm of breast - Plan: MM 3D SCREEN BREAST BILATERAL; pt to sched mammo  Dyspareunia in female - Plan: estradiol (ESTRACE) 0.1 MG/GM vaginal cream; try vag ERT. Rx estrace crm, cont lubricants. Vag tissue looks pretty healthy  Vaginal dryness, menopausal - Plan: estradiol (ESTRACE) 0.1 MG/GM vaginal cream  Screening for osteoporosis - Plan: DG Bone Density; check DEXA. Will f/u with results. Pt to sched with mammo at [facility]  Premature menopause - Plan: DG Bone Density  Osteopenia, unspecified location - Plan: DG Bone Density   No orders of the defined types were placed in this encounter.           GYN counsel breast self exam, mammography screening, menopause, adequate intake of calcium and vitamin D, diet and exercise    F/U  No follow-ups on file.  Kruze Atchley B. Elicia Lui, PA-C 08/29/2022 7:23 PM

## 2022-08-29 NOTE — Progress Notes (Signed)

## 2022-08-30 ENCOUNTER — Ambulatory Visit (INDEPENDENT_AMBULATORY_CARE_PROVIDER_SITE_OTHER): Payer: 59 | Admitting: Obstetrics and Gynecology

## 2022-08-30 ENCOUNTER — Encounter: Payer: Self-pay | Admitting: Obstetrics and Gynecology

## 2022-08-30 VITALS — BP 122/70 | Ht 63.0 in | Wt 203.0 lb

## 2022-08-30 DIAGNOSIS — Z1231 Encounter for screening mammogram for malignant neoplasm of breast: Secondary | ICD-10-CM

## 2022-08-30 DIAGNOSIS — E28319 Asymptomatic premature menopause: Secondary | ICD-10-CM | POA: Diagnosis not present

## 2022-08-30 DIAGNOSIS — R1032 Left lower quadrant pain: Secondary | ICD-10-CM | POA: Diagnosis not present

## 2022-08-30 DIAGNOSIS — Z01419 Encounter for gynecological examination (general) (routine) without abnormal findings: Secondary | ICD-10-CM

## 2022-08-30 DIAGNOSIS — Z01411 Encounter for gynecological examination (general) (routine) with abnormal findings: Secondary | ICD-10-CM

## 2022-08-30 NOTE — Patient Instructions (Addendum)
I value your feedback and you entrusting us with your care. If you get a [facility] patient survey, I would appreciate you taking the time to let us know about your experience today. Thank you! ? ? ?

## 2022-09-20 ENCOUNTER — Ambulatory Visit: Payer: 59 | Admitting: Gastroenterology

## 2022-09-20 ENCOUNTER — Encounter: Payer: Self-pay | Admitting: Gastroenterology

## 2022-09-20 ENCOUNTER — Telehealth: Payer: Self-pay

## 2022-09-20 VITALS — BP 117/75 | HR 98 | Temp 98.3°F | Ht 63.0 in | Wt 196.1 lb

## 2022-09-20 DIAGNOSIS — K640 First degree hemorrhoids: Secondary | ICD-10-CM | POA: Diagnosis not present

## 2022-09-20 DIAGNOSIS — N898 Other specified noninflammatory disorders of vagina: Secondary | ICD-10-CM

## 2022-09-20 MED ORDER — FLUCONAZOLE 150 MG PO TABS: 150.0000 mg | ORAL_TABLET | Freq: Once | ORAL | 0 refills | Status: AC

## 2022-09-20 NOTE — Telephone Encounter (Signed)
Left voicemail to notify: Plan per Elmo Putt Copland, PAC -   Only use dove sensitive soap, no deodorant pads or tampons, fragrance free detergent   Use over the counter cortisone cream for external itching.   Rx for Diflucan 172m one time dose has been sent to her pharmacy. See provider if no relief with these methods.

## 2022-09-20 NOTE — Progress Notes (Signed)

## 2022-09-20 NOTE — Telephone Encounter (Signed)
Vaginal/Vulvar Complaints -  Assess and document - Patient states on 09/17/22 she started experiencing vaginal itching.  denies UTI symptoms.  admits to to the use of new detergents She has tried OTC Vagisil and monistat for the past 3 days without relief.

## 2022-09-20 NOTE — Telephone Encounter (Signed)
[facility] VOICEMAIL: Patient requesting rx for yeast infection. She has tried OTC without relief. Cb [phone].

## 2022-09-21 ENCOUNTER — Other Ambulatory Visit: Payer: 59

## 2022-09-22 ENCOUNTER — Encounter: Payer: Self-pay | Admitting: Obstetrics and Gynecology

## 2022-09-27 ENCOUNTER — Other Ambulatory Visit: Payer: Self-pay | Admitting: Family Medicine

## 2022-09-27 DIAGNOSIS — N200 Calculus of kidney: Secondary | ICD-10-CM

## 2022-09-27 NOTE — Progress Notes (Signed)
09/28/2022 11:30 AM   [name] 1971/07/20 [id number]  Referring provider: [name], PA-C [address] [place],  [place] [address]  Urological history: 1. Nephrolithiasis -left URS (07/2021)  -KUB (09/2022) no stones visualized   Chief Complaint  Patient presents with   Nephrolithiasis    HPI: Joan Johnson is a 52 y.o. female who presents today for one year follow up.   She has not passed any fragments, had any renal colic, gross hematuria or urinary tract infections since her last visit with [place] in February 2023.  She informs me that she has been having issues with kidney stones since age of 1.  She denies ever having a metabolic workup.  She does not have any issues with urination at this time.  Patient denies any modifying or aggravating factors.  Patient denies any dysuria or suprapubic.  Patient denies any fevers, chills, nausea or vomiting.    KUB no stones visualized   PMH: Past Medical History:  Diagnosis Date   Allergy    Anxiety    Arthritis    History of kidney stones    Hypertension    Migraine    Osteopenia 06/2021   DEX[facility] at [facility]; spine and hip    Surgical History: Past Surgical History:  Procedure Laterality Date   [place]   COLONOSCOPY WITH PROPOFOL N/A 10/01/2020   Procedure: COLONOSCOPY WITH PROPOFOL;  Surgeon: Lin Landsman, MD;  Location: [facility] ENDOSCOPY;  Service: Gastroenterology;  Laterality: N/A;   COSMETIC SURGERY     CYSTOSCOPY W/ RETROGRADES Left 07/19/2021   Procedure: CYSTOSCOPY WITH RETROGRADE PYELOGRAM;  Surgeon: Hollice Espy, MD;  Location: [facility] ORS;  Service: Urology;  Laterality: Left;   CYSTOSCOPY/URETEROSCOPY/HOLMIUM LASER/STENT PLACEMENT Left 07/19/2021   Procedure: CYSTOSCOPY/URETEROSCOPY/HOLMIUM LASER/STENT PLACEMENT;  Surgeon: Hollice Espy, MD;  Location: [facility] ORS;  Service: Urology;   Laterality: Left;   dental implants     ESOPHAGOGASTRODUODENOSCOPY (EGD) WITH PROPOFOL N/A 10/01/2020   Procedure: ESOPHAGOGASTRODUODENOSCOPY (EGD) WITH PROPOFOL;  Surgeon: Lin Landsman, MD;  Location: [facility] ENDOSCOPY;  Service: Gastroenterology;  Laterality: N/A;   HERNIA REPAIR     OTHER SURGICAL HISTORY     tummy tuck   OTHER SURGICAL HISTORY     carpal tunnel   TUBAL LIGATION      Home Medications:  Allergies as of 09/28/2022       Reactions   Codeine Itching, Other (See Comments)   Shellfish Allergy Hives   Shellfish-derived Products Other (See Comments)        Medication List        Accurate as of September 28, 2022 11:30 AM. If you have any questions, ask your nurse or doctor.          ALPRAZolam 0.25 MG tablet Commonly known as: XANAX Take 1 tablet (0.25 mg total) by mouth 2 (two) times daily as needed for anxiety.   EPINEPHrine 0.3 mg/0.3 mL Soaj injection Commonly known as: EPI-PEN Inject 0.3 mg into the muscle as needed for anaphylaxis.   hydroxychloroquine 200 MG tablet Commonly known as: PLAQUENIL Take 200 mg by mouth daily.   losartan 50 MG tablet Commonly known as: COZAAR Take 1 tablet (50 mg total) by mouth daily.   phentermine 15 MG capsule Take 15 mg by mouth daily.   rosuvastatin 10 MG tablet Commonly known as: Crestor Take 1 tablet (  10 mg total) by mouth daily.   topiramate 25 MG tablet Commonly known as: TOPAMAX Take 25 mg by mouth daily.        Allergies:  Allergies  Allergen Reactions   Codeine Itching and Other (See Comments)   Shellfish Allergy Hives   Shellfish-Derived Products Other (See Comments)    Family History: Family History  Problem Relation Age of Onset   Heart murmur Mother    Thyroid disease Daughter    Breast cancer Neg Hx    Ovarian cancer Neg Hx     Social History:  reports that she quit smoking about 4 years ago. Her smoking use included cigarettes. She has a 10.50 pack-year smoking history.  She has been exposed to tobacco smoke. She has never used smokeless tobacco. She reports current alcohol use of about 2.0 standard drinks of alcohol per week. She reports that she does not use drugs.  ROS: Pertinent ROS in HPI  Physical Exam: BP 113/74   Pulse 84   Ht '5\' 3"'$  (1.6 m)   Wt 196 lb (88.9 kg)   BMI 34.72 kg/m   Constitutional:  Well nourished. Alert and oriented, No acute distress. HEENT: [place] AT, moist mucus membranes.  Trachea midline Cardiovascular: No clubbing, cyanosis, or edema. Respiratory: Normal respiratory effort, no increased work of breathing. Neurologic: Grossly intact, no focal deficits, moving all 4 extremities. Psychiatric: Normal mood and affect.    Laboratory Data: Lab Results  Component Value Date   WBC 5.6 04/07/2022   HGB 13.8 04/07/2022   HCT 41.1 04/07/2022   MCV 92 04/07/2022   PLT 262 04/07/2022    Lab Results  Component Value Date   CREATININE 0.70 04/07/2022    Lab Results  Component Value Date   HGBA1C 5.3 04/07/2022    Lab Results  Component Value Date   TSH 1.790 04/07/2022       Component Value Date/Time   CHOL 199 04/07/2022 0802   HDL 54 04/07/2022 0802   CHOLHDL 3.7 04/07/2022 0802   LDLCALC 126 (H) 04/07/2022 0802    Lab Results  Component Value Date   AST 15 04/07/2022   Lab Results  Component Value Date   ALT 20 04/07/2022  I have reviewed the labs.   Pertinent Imaging: CLINICAL DATA:  Kidney stone.  Bilateral flank pain occasionally.   EXAM: ABDOMEN - 1 VIEW   COMPARISON:  04/21/2021 and CT abdomen 04/17/2021.   FINDINGS: Stool and gas in the colon largely obscure the renal outlines bilaterally. There may be cluster of tiny stones in the expected location of the left kidney. No definite calcifications along the expected course of the ureters or bladder.   IMPRESSION: Probable left renal stones.     Electronically Signed   By: Lorin Picket M.D.   On: 09/30/2022 11:14 I have  independently reviewed the films.    Assessment & Plan:    1. Nephrolithiasis -KUB no stone visualized -She would like to pursue [id number] metabolic workup -L[facility]olink is ordered   Return for pending Litholink results .  These notes generated with voice recognition software. I apologize for typographical errors.  [place], [place] [address]  [place], [place] [address] [phone]

## 2022-09-28 ENCOUNTER — Ambulatory Visit
Admission: RE | Admit: 2022-09-28 | Discharge: 2022-09-28 | Disposition: A | Discharge status: Home / Self Care | Payer: 59 | Attending: Urology | Admitting: Urology

## 2022-09-28 ENCOUNTER — Ambulatory Visit
Admission: RE | Admit: 2022-09-28 | Discharge: 2022-09-28 | Disposition: A | Discharge status: Home / Self Care | Payer: 59 | Source: Ambulatory Visit | Attending: Urology | Admitting: Urology

## 2022-09-28 ENCOUNTER — Ambulatory Visit: Payer: 59 | Admitting: Urology

## 2022-09-28 ENCOUNTER — Encounter: Payer: Self-pay | Admitting: Urology

## 2022-09-28 VITALS — BP 113/74 | HR 84 | Ht 63.0 in | Wt 196.0 lb

## 2022-09-28 DIAGNOSIS — N200 Calculus of kidney: Secondary | ICD-10-CM

## 2022-09-28 DIAGNOSIS — Z87442 Personal history of urinary calculi: Secondary | ICD-10-CM

## 2022-09-28 NOTE — Patient Instructions (Signed)

## 2022-10-05 ENCOUNTER — Encounter: Payer: Self-pay | Admitting: Physician Assistant

## 2022-10-05 ENCOUNTER — Ambulatory Visit: Payer: 59 | Admitting: Physician Assistant

## 2022-10-05 VITALS — BP 117/67 | HR 97 | Wt 197.7 lb

## 2022-10-05 DIAGNOSIS — I10 Essential (primary) hypertension: Secondary | ICD-10-CM | POA: Diagnosis not present

## 2022-10-05 DIAGNOSIS — K5900 Constipation, unspecified: Secondary | ICD-10-CM | POA: Insufficient documentation

## 2022-10-05 DIAGNOSIS — L209 Atopic dermatitis, unspecified: Secondary | ICD-10-CM

## 2022-10-05 MED ORDER — MOMETASONE FUROATE 0.1 % EX CREA: TOPICAL_CREAM | CUTANEOUS | 0 refills | Status: DC

## 2022-10-05 NOTE — Assessment & Plan Note (Signed)
Well controlled Advised if pressure <110/60 consistently can lower dose of losartan Advised to monitor at home F/u 6 mo

## 2022-10-05 NOTE — Assessment & Plan Note (Signed)
Rx mometasone to use topically for up to 10 days at time

## 2022-10-05 NOTE — Assessment & Plan Note (Signed)
Recommended against fleet enemas on a regular basis  Metamucil powder on a daily basis and miralax powder prn

## 2022-10-05 NOTE — Progress Notes (Signed)
I,Sha'taria Tyson,acting as a Education administrator for [employer], PA-C.,have documented all relevant documentation on the behalf of [name], PA-C,as directed by  [name], PA-C while in the presence of [name], PA-C.   Established patient visit   Patient: Joan Johnson   DOB: [birth year]   52 y.o. Female  MRN: [id number] Visit Date: 10/05/2022  Today's healthcare provider: [name], PA-C   Cc. Htn f/u, a few acute concerns  Subjective    HPI  Hypertension, follow-up  BP Readings from Last 3 Encounters:  10/05/22 117/67  09/28/22 113/74  09/20/22 117/75   Wt Readings from Last 3 Encounters:  10/05/22 197 lb 11.2 oz (89.7 kg)  09/28/22 196 lb (88.9 kg)  09/20/22 196 lb 2 oz (89 kg)     She was last seen for hypertension 6 months ago.  BP at that visit was 121/61. Management since that visit includes continue current medications.  Outside blood pressures are not being checked; Symptoms: No chest pain No chest pressure  No palpitations No syncope  No dyspnea No orthopnea  No paroxysmal nocturnal dyspnea No lower extremity edema  ---------------------------------------------------------------------------------------------------  Atopic derm -area on top of her lip that is red and itchy x 1 mo.   Constipation -Pt report since starting phentermine/topamax at the weight loss clinic she has been constipated. Reports taking a fiber one supplement and doing fleet enemas.   Medications: Outpatient Medications Prior to Visit  Medication Sig   ALPRAZolam (XANAX) 0.25 MG tablet Take 1 tablet (0.25 mg total) by mouth 2 (two) times daily as needed for anxiety.   EPINEPHrine 0.3 mg/0.3 mL IJ SOAJ injection Inject 0.3 mg into the muscle as needed for anaphylaxis.   hydroxychloroquine (PLAQUENIL) 200 MG tablet Take 200 mg by mouth daily.   losartan (COZAAR) 50 MG tablet Take 1 tablet (50 mg total) by mouth daily.   phentermine 15 MG capsule Take 15 mg by mouth  daily.   rosuvastatin (CRESTOR) 10 MG tablet Take 1 tablet (10 mg total) by mouth daily.   topiramate (TOPAMAX) 25 MG tablet Take 25 mg by mouth daily.   No facility-administered medications prior to visit.    Review of Systems  Constitutional:  Negative for fatigue and fever.  Respiratory:  Negative for cough and shortness of breath.   Cardiovascular:  Negative for chest pain and leg swelling.  Gastrointestinal:  Positive for constipation. Negative for abdominal pain.  Skin:  Positive for rash.  Neurological:  Negative for dizziness and headaches.       Objective    BP 117/67 (BP Location: Left Arm, Patient Position: Sitting, Cuff Size: Normal)   Pulse 97   Wt 197 lb 11.2 oz (89.7 kg)   SpO2 100%   BMI 35.02 kg/m   Physical Exam Vitals reviewed.  Constitutional:      Appearance: She is not ill-appearing.  HENT:     Head: Normocephalic.  Eyes:     Conjunctiva/sclera: Conjunctivae normal.  Cardiovascular:     Rate and Rhythm: Normal rate.  Pulmonary:     Effort: Pulmonary effort is normal. No respiratory distress.  Neurological:     General: No focal deficit present.     Mental Status: She is alert and oriented to person, place, and time.  Psychiatric:        Mood and Affect: Mood normal.        Behavior: Behavior normal.      No results found for any visits on 10/05/22.  Assessment & Plan     Problem List Items Addressed This Visit       Cardiovascular and Mediastinum   Hypertension - Primary    Well controlled Advised if pressure <110/60 consistently can lower dose of losartan Advised to monitor at home F/u 6 mo        Musculoskeletal and Integument   Atopic dermatitis    Rx mometasone to use topically for up to 10 days at time      Relevant Medications   mometasone (ELOCON) 0.1 % cream     Other   Constipation    Recommended against fleet enemas on a regular basis  Metamucil powder on a daily basis and miralax powder prn           Return in about 6 months (around 04/07/2023) for CPE.      I, [name], PA-C have reviewed all documentation for this visit. The documentation on  10/05/22  for the exam, diagnosis, procedures, and orders are all accurate and complete.  [name], PA-C [facility] [address] [place], [place], [address] Office: [phone] Fax: [facility]

## 2022-10-05 NOTE — Patient Instructions (Signed)
Metamucil powder  Miralax powder

## 2022-10-10 ENCOUNTER — Ambulatory Visit: Payer: 59 | Admitting: Gastroenterology

## 2022-10-10 ENCOUNTER — Encounter: Payer: Self-pay | Admitting: Gastroenterology

## 2022-10-10 ENCOUNTER — Other Ambulatory Visit: Payer: Self-pay

## 2022-10-10 VITALS — BP 135/79 | HR 120 | Temp 97.9°F | Ht 63.0 in | Wt 194.5 lb

## 2022-10-10 DIAGNOSIS — K640 First degree hemorrhoids: Secondary | ICD-10-CM

## 2022-10-10 DIAGNOSIS — K5904 Chronic idiopathic constipation: Secondary | ICD-10-CM

## 2022-10-10 MED ORDER — TRULANCE 3 MG PO TABS: 1.0000 | ORAL_TABLET | Freq: Every day | ORAL | 2 refills | Status: DC

## 2022-10-10 NOTE — Progress Notes (Signed)
PROCEDURE NOTE: The patient presents with symptomatic grade 1 hemorrhoids, unresponsive to maximal medical therapy, requesting rubber band ligation of his/her hemorrhoidal disease.  All risks, benefits and alternative forms of therapy were described and informed consent was obtained.  The decision was made to band the LL internal hemorrhoid, and the [facility]'Regan System was used to perform band ligation without complication.  Digital anorectal examination was then performed to assure proper positioning of the band, and to adjust the banded tissue as required.  The patient was discharged home without pain or other issues.  Dietary and behavioral recommendations were given and (if necessary - prescriptions were given), along with follow-up instructions.  The patient will return 2 weeks for follow-up and possible additional banding as required.  No complications were encountered and the patient tolerated the procedure well.    

## 2022-10-10 NOTE — Progress Notes (Signed)
Cephas Darby, MD [address]  [place]  [place], [place] [address]  Main: [phone]  Fax: [phone]    Gastroenterology Consultation  Referring Provider:     [name], PA-C Primary Care Physician:  [name], PA-C Primary Gastroenterologist:  Dr. Cephas Darby Reason for Consultation: Symptomatic hemorrhoids        HPI:   Joan Johnson is a 52 y.o. female referred by [name], PA-C  for consultation & management of symptomatic hemorrhoids.  Patient reports that for the last several years, she has been experiencing hemorrhoidal symptoms including rectal pressure, intermittent bright red blood per rectum, dripping into the toilet bowel, rectal pain as well as perianal itching.  She does notice soft tissue around the anal opening which prevents from cleaning properly.  She has used over-the-counter hemorrhoidal creams with temporary relief only.  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  She reports that her bowel movements are soft but that she has to strain.  She is on a weight loss program and also on Ozempic, lost about 10 pounds.  Follow-up visit 10/10/2022 Patient is here for follow-up of hemorrhoidal banding.  She also reports suffering from constipation lately even though she is trying to follow healthy diet.  She is taking MiraLAX daily which is not providing much relief.  She is requesting any prescription medication for constipation  She does not smoke, occasional alcohol use  NSAIDs: None  Antiplts/Anticoagulants/Anti thrombotics: None  GI Procedures:  Patient underwent colonoscopy on 10/01/2020, excellent bowel prep, found to have 2 subcentimeter polyps, retroflexion in the rectum was normal DIAGNOSIS:  A. COLON POLYP, ASCENDING; COLD BIOPSY:  - TUBULAR ADENOMA.  - NEGATIVE FOR HIGH-GRADE DYSPLASIA AND MALIGNANCY.   B.  COLON POLYP, RECTOSIGMOID; COLD BIOPSY:  - HYPERPLASTIC  POLYP.  - NEGATIVE FOR DYSPLASIA AND MALIGNANCY.   She denies family history of GI malignancy  Past Medical History:  Diagnosis Date   Allergy    Anxiety    Arthritis    History of kidney stones    Hypertension    Migraine    Osteopenia 06/2021   DEXA at [facility]; spine and hip    Past Surgical History:  Procedure Laterality Date   [place]   COLONOSCOPY WITH PROPOFOL N/A 10/01/2020   Procedure: COLONOSCOPY WITH PROPOFOL;  Surgeon: Lin Landsman, MD;  Location: [facility] ENDOSCOPY;  Service: Gastroenterology;  Laterality: N/A;   COSMETIC SURGERY     CYSTOSCOPY W/ RETROGRADES Left 07/19/2021   Procedure: CYSTOSCOPY WITH RETROGRADE PYELOGRAM;  Surgeon: Hollice Espy, MD;  Location: [facility] ORS;  Service: Urology;  Laterality: Left;   CYSTOSCOPY/URETEROSCOPY/HOLMIUM LASER/STENT PLACEMENT Left 07/19/2021   Procedure: CYSTOSCOPY/URETEROSCOPY/HOLMIUM LASER/STENT PLACEMENT;  Surgeon: Hollice Espy, MD;  Location: [facility] ORS;  Service: Urology;  Laterality: Left;   dental implants     ESOPHAGOGASTRODUODENOSCOPY (EGD) WITH PROPOFOL N/A 10/01/2020   Procedure: ESOPHAGOGASTRODUODENOSCOPY (EGD) WITH PROPOFOL;  Surgeon: Lin Landsman, MD;  Location: [facility] ENDOSCOPY;  Service: Gastroenterology;  Laterality: N/A;   HERNIA REPAIR     OTHER SURGICAL HISTORY     tummy tuck   OTHER SURGICAL HISTORY     carpal tunnel   TUBAL LIGATION      Current Outpatient Medications:    ALPRAZolam (XANAX) 0.25 MG tablet, Take 1 tablet (0.25 mg  total) by mouth 2 (two) times daily as needed for anxiety., Disp: 20 tablet, Rfl: 0   EPINEPHrine 0.3 mg/0.3 mL IJ SOAJ injection, Inject 0.3 mg into the muscle as needed for anaphylaxis., Disp: 2 each, Rfl: 0   hydroxychloroquine (PLAQUENIL) 200 MG tablet, Take 200 mg by mouth daily., Disp: , Rfl:    losartan (COZAAR) 50 MG tablet, Take 1 tablet (50 mg total) by mouth  daily., Disp: 90 tablet, Rfl: 2   mometasone (ELOCON) 0.1 % cream, Use twice daily for up to 10 days in a row, Disp: 45 g, Rfl: 0   phentermine 15 MG capsule, Take 15 mg by mouth daily., Disp: , Rfl:    Plecanatide (TRULANCE) 3 MG TABS, Take 1 tablet (3 mg total) by mouth daily., Disp: 30 tablet, Rfl: 2   rosuvastatin (CRESTOR) 10 MG tablet, Take 1 tablet (10 mg total) by mouth daily., Disp: 90 tablet, Rfl: 1   topiramate (TOPAMAX) 25 MG tablet, Take 25 mg by mouth daily., Disp: , Rfl:    Vitamin D, Ergocalciferol, (DRISDOL) 1.25 MG (50000 UNIT) CAPS capsule, Take 50,000 Units by mouth once a week., Disp: , Rfl:    Family History  Problem Relation Age of Onset   Heart murmur Mother    Thyroid disease Daughter    Breast cancer Neg Hx    Ovarian cancer Neg Hx      Social History   Tobacco Use   Smoking status: Former    Packs/day: 0.50    Years: 21.00    Total pack years: 10.50    Types: Cigarettes    Quit date: 03/29/2018    Years since quitting: 4.5    Passive exposure: Past   Smokeless tobacco: Never  Vaping Use   Vaping Use: Never used  Substance Use Topics   Alcohol use: Yes    Alcohol/week: 2.0 standard drinks of alcohol    Types: 2 Glasses of wine per week    Comment: weekly   Drug use: Never    Allergies as of 10/10/2022 - Review Complete 10/10/2022  Allergen Reaction Noted   Codeine Itching and Other (See Comments) 06/30/2021   Shellfish allergy Hives 05/20/2020   Shellfish-derived products Other (See Comments) 12/20/2021    Review of Systems:    All systems reviewed and negative except where noted in HPI.   Physical Exam:  BP 135/79 (BP Location: Left Arm, Patient Position: Sitting, Cuff Size: Normal)   Pulse (!) 120   Temp 97.9 F (36.6 C) (Oral)   Ht '5\' 3"'$  (1.6 m)   Wt 194 lb 8 oz (88.2 kg)   BMI 34.45 kg/m  No LMP recorded. Patient is postmenopausal.  General:   Alert,  Well-developed, well-nourished, pleasant and cooperative in NAD Head:   Normocephalic and atraumatic. Eyes:  Sclera clear, no icterus.   Conjunctiva pink. Ears:  Normal auditory acuity. Nose:  No deformity, discharge, or lesions. Mouth:  No deformity or lesions,oropharynx pink & moist. Neck:  Supple; no masses or thyromegaly. Lungs:  Respirations even and unlabored.  Clear throughout to auscultation.   No wheezes, crackles, or rhonchi. No acute distress. Heart:  Regular rate and rhythm; no murmurs, clicks, rubs, or gallops. Abdomen:  Normal bowel sounds. Soft, non-tender and non-distended without masses, hepatosplenomegaly or hernias noted.  No guarding or rebound tenderness.   Rectal: Multiple perianal skin tags, nontender digital rectal exam, soft brown stool, anoscopy revealed external hemorrhoids only Msk:  Symmetrical without gross deformities. Good, equal movement &  strength bilaterally. Pulses:  Normal pulses noted. Extremities:  No clubbing or edema.  No cyanosis. Neurologic:  Alert and oriented x3;  grossly normal neurologically. Skin:  Intact without significant lesions or rashes. No jaundice. Psych:  Alert and cooperative. Normal mood and affect.  Imaging Studies: Reviewed  Assessment and Plan:   Joan Johnson is a 52 y.o. female with history of hypertension, obesity, osteoarthritis, osteopenia is seen in consultation for hemorrhoidal symptoms  Grade 1 external hemorrhoids Discussed about outpatient hemorrhoid ligation, the procedure, risks and benefits Consent obtained Proceed with hemorrhoid ligation today  Constipation Trial of Trulance 3 mg daily, samples provided Reiterated about high-fiber diet, fiber supplements and adequate intake of water  Follow up as needed  Cephas Darby, MD

## 2022-10-10 NOTE — Patient Instructions (Signed)
Gave Trulance samples and sent a prescription to the pharmacy.

## 2022-10-11 ENCOUNTER — Telehealth: Payer: Self-pay | Admitting: Gastroenterology

## 2022-10-11 NOTE — Telephone Encounter (Signed)
If it is just a small amount of blood on wiping, nothing to worry.  She can hold off on taking Trulance today.  Alternatively, she could try MiraLAX 34 g daily if Trulance is too strong for her  RV

## 2022-10-11 NOTE — Telephone Encounter (Signed)
Patient calling to go over sample medication Dr. Marius Ditch sent her home with. She states that the medication gave her the "runs". Patient also states that when she made a bowel movement , when she wiped there was blood. Requesting call back from Dr. Verlin Grills assistant.

## 2022-10-11 NOTE — Telephone Encounter (Signed)
Placed States she took the medication yesterday at 3:00pm. She states Dr. 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 She states 2 hours after she took the medication she started having diarrhea. She states she had diarrhea for a good hour or two. She states this morning when she woke up she had a little bowel movement and had a little bit of blood when she wiped. She wants to know what you recommend.

## 2022-10-11 NOTE — Telephone Encounter (Signed)
Patient verablized understanding of instructions she states she will try just taking the Miralax

## 2022-10-12 ENCOUNTER — Telehealth: Payer: Self-pay

## 2022-10-12 NOTE — Telephone Encounter (Signed)
Submitted PA through cover my meds for Trulance. Waiting on response from insurance company.

## 2022-10-14 ENCOUNTER — Ambulatory Visit: Payer: Self-pay

## 2022-10-14 NOTE — Telephone Encounter (Signed)
  Chief Complaint: medication assistance Symptoms: constipation Frequency:  Pertinent Negatives: NA Disposition: [] ED /[] Urgent Care (no appt availability in office) / [] Appointment(In office/virtual)/ []  [facility]/ [x] Home Care/ [] Refused Recommended Disposition /[] [facility]/ []  Follow-up with PCP Additional Notes: pt is starting to take Miralax and Metamucil but wanting to know if she can mix both powders together. I advised her to take separately. Pt asked if she is suppose to take both daily or what [place], [place] recommended. Advised pt of Ria Comment, [place] recommendations from [facility] notes and pt verbalized understanding.   Summary: medication question   Pt was advised she can take Miralax and she thought Ria Comment said she can take metamucil with it as well /  she asked if she can mix the two together and take it or take it separately / please advise if this is correct         Reason for Disposition  Caller has medicine question only, adult not sick, AND triager answers question  Answer Assessment - Initial Assessment Questions 1. NAME of MEDICINE: "What medicine(s) are you calling about?"     Miralax and Metamucil  2. QUESTION: "What is your question?" (e.g., double dose of medicine, side effect)     Wanting to know if she can mix both powders  3. PRESCRIBER: "Who prescribed the medicine?" Reason: if prescribed by specialist, call should be referred to that group.     Ria Comment, [place] 4. SYMPTOMS: "Do you have any symptoms?" If Yes, ask: "What symptoms are you having?"  "How bad are the symptoms (e.g., mild, moderate, severe)     Constipation  Protocols used: Medication Question Call-A-AH

## 2022-10-20 ENCOUNTER — Telehealth: Payer: Self-pay | Admitting: Physician Assistant

## 2022-10-20 ENCOUNTER — Ambulatory Visit: Payer: 59 | Admitting: Family Medicine

## 2022-10-20 ENCOUNTER — Telehealth: Payer: Self-pay | Admitting: *Deleted

## 2022-10-20 ENCOUNTER — Encounter: Payer: Self-pay | Admitting: Physician Assistant

## 2022-10-20 ENCOUNTER — Ambulatory Visit: Payer: 59 | Admitting: Physician Assistant

## 2022-10-20 VITALS — BP 121/68 | HR 99 | Temp 97.9°F | Ht 63.0 in | Wt 193.0 lb

## 2022-10-20 DIAGNOSIS — R490 Dysphonia: Secondary | ICD-10-CM

## 2022-10-20 DIAGNOSIS — R070 Pain in throat: Secondary | ICD-10-CM | POA: Diagnosis not present

## 2022-10-20 DIAGNOSIS — R051 Acute cough: Secondary | ICD-10-CM | POA: Diagnosis not present

## 2022-10-20 MED ORDER — PROMETHAZINE-DM 6.25-15 MG/5ML PO SYRP: 5.0000 mL | ORAL_SOLUTION | Freq: Four times a day (QID) | ORAL | 0 refills | Status: DC | PRN

## 2022-10-20 NOTE — Telephone Encounter (Signed)
patient is asking for another provider. [facility] is not a good fit. Doesn't believe she diagonised her correctly or even wanted to provide prescription or request lab work. patient would like call back from office administor around 10:15 am tomorrow 3/22

## 2022-10-20 NOTE — Progress Notes (Unsigned)
Established patient visit   Patient: Joan Johnson   DOB: Jul 14, 1971   52 y.o. Female  MRN: [id number] Visit Date: 10/20/2022  Today's healthcare provider: Mardene Speak, PA-C   CC: sore throat, lost voice  Subjective     HPI   Sore throat, lose voice, body ache,coughing w/ green mucus, post nasal drip --5 days Last edited by Elta Guadeloupe, CMA on 10/20/2022  1:06 PM.      Pt had a friend's party last weekend. Just learned that a daughter of her friend was diagnosed with strep throat. However, denies having fever, nausea, absence of cough, vomiting, stomach ache, trouble breathing. Endorses having throat pain with talking.  Medications: Outpatient Medications Prior to Visit  Medication Sig   ALPRAZolam (XANAX) 0.25 MG tablet Take 1 tablet (0.25 mg total) by mouth 2 (two) times daily as needed for anxiety.   EPINEPHrine 0.3 mg/0.3 mL IJ SOAJ injection Inject 0.3 mg into the muscle as needed for anaphylaxis.   hydroxychloroquine (PLAQUENIL) 200 MG tablet Take 200 mg by mouth daily.   mometasone (ELOCON) 0.1 % cream Use twice daily for up to 10 days in a row   phentermine 15 MG capsule Take 15 mg by mouth daily.   rosuvastatin (CRESTOR) 10 MG tablet Take 1 tablet (10 mg total) by mouth daily.   topiramate (TOPAMAX) 25 MG tablet Take 25 mg by mouth daily.   Vitamin D, Ergocalciferol, (DRISDOL) 1.25 MG (50000 UNIT) CAPS capsule Take 50,000 Units by mouth once a week.   losartan (COZAAR) 50 MG tablet Take 1 tablet (50 mg total) by mouth daily.   [DISCONTINUED] Plecanatide (TRULANCE) 3 MG TABS Take 1 tablet (3 mg total) by mouth daily.   No facility-administered medications prior to visit.    Review of Systems  Constitutional:  Positive for chills.  HENT:  Positive for congestion, postnasal drip and voice change.   Respiratory:  Positive for cough and shortness of breath.        Objective    BP 121/68 (BP Location: Left Arm, Patient Position: Sitting, Cuff Size:  Normal)   Pulse 99   Temp 97.9 F (36.6 C)   Ht 5\' 3"  (1.6 m)   Wt 193 lb (87.5 kg)   SpO2 100%   BMI 34.19 kg/m    Physical Exam Vitals reviewed.  Constitutional:      General: She is not in acute distress.    Appearance: Normal appearance. She is well-developed. She is not diaphoretic.  HENT:     Head: Normocephalic and atraumatic.     Right Ear: Ear canal and external ear normal.     Left Ear: Ear canal and external ear normal.     Nose: Congestion (very mild) and rhinorrhea (mild) present.     Mouth/Throat:     Pharynx: Posterior oropharyngeal erythema (some erythema) present.     Comments: Postnasal drainage  Eyes:     General: No scleral icterus.       Right eye: No discharge.        Left eye: No discharge.     Extraocular Movements: Extraocular movements intact.     Conjunctiva/sclera: Conjunctivae normal.     Pupils: Pupils are equal, round, and reactive to light.  Neck:     Thyroid: No thyromegaly.  Cardiovascular:     Rate and Rhythm: Normal rate and regular rhythm.     Pulses: Normal pulses.     Heart sounds: Normal heart sounds. No  murmur heard. Pulmonary:     Effort: Pulmonary effort is normal. No respiratory distress.     Breath sounds: Normal breath sounds. No wheezing, rhonchi or rales.  Musculoskeletal:     Cervical back: Neck supple.     Right lower leg: No edema.     Left lower leg: No edema.  Lymphadenopathy:     Cervical: No cervical adenopathy.  Skin:    General: Skin is warm and dry.     Findings: No rash.  Neurological:     Mental Status: She is alert and oriented to person, place, and time. Mental status is at baseline.  Psychiatric:        Mood and Affect: Mood normal.        Behavior: Behavior normal.      No results found for any visits on 10/20/22.  Assessment & Plan    1. Throat pain 2. Hoarseness of voice Acute Could be due to viral laryngitis  Modified centor criteria showed no need for rapid strep test however, pt  requested strep test. Strep test was negative.  Counseled with patient that Limited but good evidence that treatment beyond supportive care is ineffective Supportive care consists of hydration, voice rest, humidification, and limitation of caffeine.  Antibiotics appear to have no benefit in acute laryngitis because etiologies are predominantly viral. In chronic laryngitis, consider bacterial sources such as MRSA. However, pt denies having chronic throat pain or hoarseness. Discussed with pt about  Corticosteroids in severe cases of laryngitis could reduce inflammation such as croup but she declined taking steroids 2/2 participation in weight loss program.  Planned to Rx Nebulized epinephrine or Racemic epinephrine reduces croup symptoms at 30 minutes, but effect lasts only 2 hours  if pt agrees.  Pt was explained that acute laryngitis  Usually a self-limited illness lasting <3 weeks and not severe Antibiotics of no value however, I ordered CBC to check WBC values in case bacterial laryngitis with plan to reassess her treatment plan  Advised to avoid excessive voice use, including whispering. Steam inhalations or cool-mist humidifier Increase fluid intake, especially in cases associated with excessive dryness. Avoid smoking (secondhand exposure). She is a former smoker Warm saltwater gargles, use anesthetic lozenges. Call in cough suppressants. In case of possible  Reflux laryngitis:  Elevate head of bed Diet changes Other antireflux lifestyle change management including H2 Blockers or Proton pump inhibitors. Pt declined to use these measures   No follow-ups on file.    The patient was advised to call back or seek an in-person evaluation if the symptoms worsen or if the condition fails to improve as anticipated.  I discussed the assessment and treatment plan with the patient. The patient was provided an opportunity to ask questions and all were answered. After the visit, l learned  that pt prefers not to proceed with discussed treatment plan and wants to switch a decision maker.  Informed an Scientist, physiological and requested to communicate with patient, learned that it is due to language barrier.   I, Mardene Speak, PA-C have reviewed all documentation for this visit. The documentation on  10/20/22 for the exam, diagnosis, procedures, and orders are all accurate and complete.  Mardene Speak, [facility], [place] [phone] (phone) [phone] (fax)   [place]

## 2022-10-21 ENCOUNTER — Other Ambulatory Visit: Payer: Self-pay | Admitting: Physician Assistant

## 2022-10-21 ENCOUNTER — Telehealth: Payer: Self-pay | Admitting: Physician Assistant

## 2022-10-21 DIAGNOSIS — R051 Acute cough: Secondary | ICD-10-CM

## 2022-10-21 LAB — CBC WITH DIFFERENTIAL/PLATELET
Basophils Absolute: 0.1 10*3/uL (ref 0.0–0.2)
Basos: 1 %
EOS (ABSOLUTE): 0.3 10*3/uL (ref 0.0–0.4)
Eos: 3 %
Hematocrit: 41.2 % (ref 34.0–46.6)
Hemoglobin: 14 g/dL (ref 11.1–15.9)
Immature Grans (Abs): 0 10*3/uL (ref 0.0–0.1)
Immature Granulocytes: 0 %
Lymphocytes Absolute: 1.6 10*3/uL (ref 0.7–3.1)
Lymphs: 15 %
MCH: 30.9 pg (ref 26.6–33.0)
MCHC: 34 g/dL (ref 31.5–35.7)
MCV: 91 fL (ref 79–97)
Monocytes Absolute: 1.3 10*3/uL — ABNORMAL HIGH (ref 0.1–0.9)
Monocytes: 12 %
Neutrophils Absolute: 7.5 10*3/uL — ABNORMAL HIGH (ref 1.4–7.0)
Neutrophils: 69 %
Platelets: 296 10*3/uL (ref 150–450)
RBC: 4.53 x10E6/uL (ref 3.77–5.28)
RDW: 12.3 % (ref 11.7–15.4)
WBC: 10.8 10*3/uL (ref 3.4–10.8)

## 2022-10-21 MED ORDER — HYDROCOD POLI-CHLORPHE POLI ER 10-8 MG/5ML PO SUER: 5.0000 mL | Freq: Every evening | ORAL | 0 refills | Status: DC | PRN

## 2022-10-21 NOTE — Telephone Encounter (Signed)
Sent alternative cough med

## 2022-10-21 NOTE — Telephone Encounter (Signed)
Pt states she still have sore throat and sever cough.  Medicine that was prescribed yesterday for cough isn't helping.   Daughter tested positive for strep today.    Pt states she tested yesterday and it was negative however it isn't getting better and has been going on since Saturday 10/15/22.   Pt requesting something be called in - at least for cough and antibiotic if able.

## 2022-10-24 ENCOUNTER — Ambulatory Visit: Payer: Self-pay | Admitting: *Deleted

## 2022-10-24 NOTE — Telephone Encounter (Signed)
Summary: cough   Pt called and was seen Thursday.  She had a cough and was given cough medication and ask if she wanted prednisone but she didn't  she said she is still coughing and wants to know if prednisone would help if so she would like the prescription.  CB@  [phone]

## 2022-10-24 NOTE — Telephone Encounter (Signed)
Patient states she is ready to try Prednisone- she was offered-but declined Chief Complaint: cough- productive Symptoms: cough Frequency: started last Wednesday Pertinent Negatives: Patient denies sore throat, fever, SOB Disposition: [] ED /[] Urgent Care (no appt availability in office) / [] Appointment(In office/virtual)/ []  [facility]/ [] Home Care/ [] Refused Recommended Disposition /[] [place] [facility]/ [x]  Follow-up with PCP Additional Notes: Patient states she would like to try Prednisone- she is supposed to go OOT Thursday. Patient states she was seen in office for sore throat, cough, fatigue- all symptoms are better except cough. Sputum is still green to clear so that has not changed.   Reason for Disposition . [1] Continuous (nonstop) coughing interferes with work or school AND [2] no improvement using cough treatment per Care Advice  Answer Assessment - Initial Assessment Questions 1. ONSET: "When did the cough begin?"      Cough started Wednesday- was seen in office- lost the voice 2. SEVERITY: "How bad is the cough today?"      Continued - not better- has been using cough medication  3. SPUTUM: "Describe the color of your sputum" (none, dry cough; clear, white, yellow, green)     Varies- green to clear 4. HEMOPTYSIS: "Are you coughing up any blood?" If so ask: "How much?" (flecks, streaks, tablespoons, etc.)     no 5. DIFFICULTY BREATHING: "Are you having difficulty breathing?" If Yes, ask: "How bad is it?" (e.g., mild, moderate, severe)    - MILD: No SOB at rest, mild SOB with walking, speaks normally in sentences, can lie down, no retractions, pulse < 100.    - MODERATE: SOB at rest, SOB with minimal exertion and prefers to sit, cannot lie down flat, speaks in phrases, mild retractions, audible wheezing, pulse 100-120.    - SEVERE: Very SOB at rest, speaks in single words, struggling to breathe, sitting hunched forward, retractions, pulse > 120      normal 6.  FEVER: "Do you have a fever?" If Yes, ask: "What is your temperature, how was it measured, and when did it start?"     no  10. OTHER SYMPTOMS: "Do you have any other symptoms?" (e.g., runny nose, wheezing, chest pain)       No- started sore throat- fatigue- better now  Protocols used: Cough - Acute Productive-A-AH

## 2022-10-25 ENCOUNTER — Other Ambulatory Visit: Payer: Self-pay | Admitting: Physician Assistant

## 2022-10-25 DIAGNOSIS — R051 Acute cough: Secondary | ICD-10-CM

## 2022-10-25 MED ORDER — BENZONATATE 100 MG PO CAPS: 100.0000 mg | ORAL_CAPSULE | Freq: Two times a day (BID) | ORAL | 0 refills | Status: DC | PRN

## 2022-10-25 NOTE — Progress Notes (Signed)
Please, let pt know that her CBC stable with slight elevated neutrophils and monocytes which could be indicative of early stage of acute laryngitis

## 2022-11-10 ENCOUNTER — Ambulatory Visit: Payer: Self-pay | Admitting: Nurse Practitioner

## 2022-11-10 ENCOUNTER — Encounter: Payer: Self-pay | Admitting: Nurse Practitioner

## 2022-11-10 VITALS — BP 128/74 | HR 93 | Temp 97.7°F | Resp 16 | Ht 63.0 in | Wt 197.6 lb

## 2022-11-10 DIAGNOSIS — E6609 Other obesity due to excess calories: Secondary | ICD-10-CM | POA: Diagnosis not present

## 2022-11-10 DIAGNOSIS — R635 Abnormal weight gain: Secondary | ICD-10-CM

## 2022-11-10 DIAGNOSIS — Z6835 Body mass index (BMI) 35.0-35.9, adult: Secondary | ICD-10-CM

## 2022-11-10 MED ORDER — DIETHYLPROPION HCL ER 75 MG PO TB24: 75.0000 mg | ORAL_TABLET | Freq: Every day | ORAL | 0 refills | Status: DC

## 2022-11-10 MED ORDER — TOPIRAMATE 25 MG PO TABS: 25.0000 mg | ORAL_TABLET | Freq: Two times a day (BID) | ORAL | 2 refills | Status: DC

## 2022-11-10 NOTE — Progress Notes (Signed)
[facility] [address] [place], [place] [address]  Internal MEDICINE  Office Visit Note  Patient Name: Joan Johnson  [id number]  [id number]  Date of Service: 11/10/2022   Complaints/HPI Pt is here for establishment of PCP. Chief Complaint  Patient presents with   New Patient (Initial Visit)    Weight loss    HPI Shifa presents for a new patient visit to establish care for weight loss management only.  Well-appearing 52 y.o. female with anxiety, arthritis and hypertension.  Current weight is 197 lbs and BMI is 35.00. She is interested in getting assistance with weight loss.  Current diet: salads for lunch  Diet and programs tried in the past: going to weight loss clinic at [facility].  Medications for weight loss tried in the past: topiramate, mounjaro, and phentermine  Exercise: goes to gym almost every day, treadmill for 30 min to 1 hr, weight training for arms and legs. Crunches  Tobacco use: stopped about 3 years ago.  Alcohol use: occasionally  Illicit drug use: none  Labs: has had labs in the past 6 months approximately.  Started at 216 lbs    Current Medication: Outpatient Encounter Medications as of 11/10/2022  Medication Sig   ALPRAZolam (XANAX) 0.25 MG tablet Take 1 tablet (0.25 mg total) by mouth 2 (two) times daily as needed for anxiety.   benzonatate (TESSALON) 100 MG capsule Take 1 capsule (100 mg total) by mouth 2 (two) times daily as needed for cough.   chlorpheniramine-HYDROcodone (TUSSIONEX) 10-8 MG/5ML Take 5 mLs by mouth at bedtime as needed for cough.   Diethylpropion HCl CR 75 MG TB24 Take 1 tablet (75 mg total) by mouth daily with breakfast.   EPINEPHrine 0.3 mg/0.3 mL IJ SOAJ injection Inject 0.3 mg into the muscle as needed for anaphylaxis.   hydroxychloroquine (PLAQUENIL) 200 MG tablet Take 200 mg by mouth daily.   mometasone (ELOCON) 0.1 % cream Use twice daily for up to 10 days in a row   phentermine 15 MG capsule Take 15 mg by  mouth daily.   rosuvastatin (CRESTOR) 10 MG tablet Take 1 tablet (10 mg total) by mouth daily.   Vitamin D, Ergocalciferol, (DRISDOL) 1.25 MG (50000 UNIT) CAPS capsule Take 50,000 Units by mouth once a week.   [DISCONTINUED] topiramate (TOPAMAX) 25 MG tablet Take 25 mg by mouth daily.   losartan (COZAAR) 50 MG tablet Take 1 tablet (50 mg total) by mouth daily.   topiramate (TOPAMAX) 25 MG tablet Take 1 tablet (25 mg total) by mouth 2 (two) times daily.   No facility-administered encounter medications on file as of 11/10/2022.    Surgical History: Past Surgical History:  Procedure Laterality Date   ANKLE SURGERY     AUGMENTATION MAMMAPLASTY     Breast Implant   CESAREAN SECTION     1991, 1989, 1987   COLONOSCOPY WITH PROPOFOL N/A 10/01/2020   Procedure: COLONOSCOPY WITH PROPOFOL;  Surgeon: Toney Reil, MD;  Location: [facility] ENDOSCOPY;  Service: Gastroenterology;  Laterality: N/A;   COSMETIC SURGERY     CYSTOSCOPY W/ RETROGRADES Left 07/19/2021   Procedure: CYSTOSCOPY WITH RETROGRADE PYELOGRAM;  Surgeon: Vanna Scotland, MD;  Location: [facility] ORS;  Service: Urology;  Laterality: Left;   CYSTOSCOPY/URETEROSCOPY/HOLMIUM LASER/STENT PLACEMENT Left 07/19/2021   Procedure: CYSTOSCOPY/URETEROSCOPY/HOLMIUM LASER/STENT PLACEMENT;  Surgeon: Vanna Scotland, MD;  Location: [facility] ORS;  Service: Urology;  Laterality: Left;   dental implants     ESOPHAGOGASTRODUODENOSCOPY (EGD) WITH PROPOFOL N/A 10/01/2020   Procedure: ESOPHAGOGASTRODUODENOSCOPY (EGD)  WITH PROPOFOL;  Surgeon: Toney Reil, MD;  Location: [facility] ENDOSCOPY;  Service: Gastroenterology;  Laterality: N/A;   HERNIA REPAIR     OTHER SURGICAL HISTORY     tummy tuck   OTHER SURGICAL HISTORY     carpal tunnel   TUBAL LIGATION      Medical History: Past Medical History:  Diagnosis Date   Allergy    Anxiety    Arthritis    History of kidney stones    Hypertension    Migraine    Osteopenia 06/2021   DEXA at [facility]; spine and  hip    Family History: Family History  Problem Relation Age of Onset   Heart murmur Mother    Thyroid disease Daughter    Breast cancer Neg Hx    Ovarian cancer Neg Hx     Social History   Socioeconomic History   Marital status: Married    Spouse name: Luis   Number of children: 2   Years of education: Not on file   Highest education level: 12th grade  Occupational History   Not on file  Tobacco Use   Smoking status: Former    Packs/day: 0.50    Years: 21.00    Additional pack years: 0.00    Total pack years: 10.50    Types: Cigarettes    Quit date: 03/29/2018    Years since quitting: 4.6    Passive exposure: Past   Smokeless tobacco: Never  Vaping Use   Vaping Use: Never used  Substance and Sexual Activity   Alcohol use: Yes    Alcohol/week: 2.0 standard drinks of alcohol    Types: 2 Glasses of wine per week    Comment: weekly   Drug use: Never   Sexual activity: Not Currently    Birth control/protection: Post-menopausal  Other Topics Concern   Not on file  Social History Narrative   ** Merged History Encounter **       Social Determinants of Health   Financial Resource Strain: Low Risk  (10/19/2022)   Overall Financial Resource Strain (CARDIA)    Difficulty of Paying Living Expenses: Not hard at all  Food Insecurity: No Food Insecurity (10/19/2022)   Hunger Vital Sign    Worried About Running Out of Food in the Last Year: Never true    Ran Out of Food in the Last Year: Never true  Transportation Needs: No Transportation Needs (10/19/2022)   PRAPARE - Administrator, Civil Service (Medical): No    Lack of Transportation (Non-Medical): No  Physical Activity: Not on file  Stress: No Stress Concern Present (10/19/2022)   [employer] of Occupational Health - Occupational Stress Questionnaire    Feeling of Stress : Not at all  Social Connections: Moderately Isolated (10/19/2022)   Social Connection and Isolation Panel [NHANES]    Frequency of  Communication with Friends and Family: More than three times a week    Frequency of Social Gatherings with Friends and Family: More than three times a week    Attends Religious Services: Never    Database administrator or Organizations: No    Attends Engineer, structural: Not on file    Marital Status: Married  Catering manager Violence: Not on file     Review of Systems  Constitutional:  Positive for unexpected weight change. Negative for chills and fatigue.  HENT:  Negative for congestion, rhinorrhea, sneezing and sore throat.   Eyes:  Negative for redness.  Respiratory:  Negative for cough, chest tightness and shortness of breath.   Cardiovascular:  Negative for chest pain and palpitations.  Gastrointestinal:  Negative for abdominal pain, constipation, diarrhea, nausea and vomiting.  Genitourinary:  Negative for dysuria and frequency.  Musculoskeletal:  Negative for arthralgias, back pain, joint swelling and neck pain.  Skin:  Negative for rash.  Neurological: Negative.  Negative for tremors and numbness.  Hematological:  Negative for adenopathy. Does not bruise/bleed easily.  Psychiatric/Behavioral:  Negative for behavioral problems (Depression), sleep disturbance and suicidal ideas. The patient is not nervous/anxious.     Vital Signs: BP 128/74   Pulse 93   Temp 97.7 F (36.5 C)   Resp 16   Ht 5\' 3"  (1.6 m)   Wt 197 lb 9.6 oz (89.6 kg)   SpO2 98%   BMI 35.00 kg/m    Physical Exam Vitals reviewed.  Constitutional:      General: She is not in acute distress.    Appearance: Normal appearance. She is obese. She is not ill-appearing.  HENT:     Head: Normocephalic and atraumatic.  Eyes:     Pupils: Pupils are equal, round, and reactive to light.  Cardiovascular:     Rate and Rhythm: Normal rate and regular rhythm.  Pulmonary:     Effort: Pulmonary effort is normal. No respiratory distress.  Neurological:     Mental Status: She is alert and oriented to  person, place, and time.  Psychiatric:        Mood and Affect: Mood normal.        Behavior: Behavior normal.       Assessment/Plan: 1. Abnormal weight gain Has established with our clinic for weight loss management only. Has reached a plateau with current treatment at another clinic and wanted to try weight loss with a different clinic  Discontinue phentermine  Start diethylpropion 75 mg daily Increase topiramate to 25 mg twice daily  Follow up in 4 weeks.  - Diethylpropion HCl CR 75 MG TB24; Take 1 tablet (75 mg total) by mouth daily with breakfast.  Dispense: 30 tablet; Refill: 0 - topiramate (TOPAMAX) 25 MG tablet; Take 1 tablet (25 mg total) by mouth 2 (two) times daily.  Dispense: 60 tablet; Refill: 2  2. Class 2 obesity due to excess calories without serious comorbidity with body mass index (BMI) of 35.0 to 35.9 in adult Try diethylpropion and increased topiramate twice daily, follow up in 4 weeks.  - Diethylpropion HCl CR 75 MG TB24; Take 1 tablet (75 mg total) by mouth daily with breakfast.  Dispense: 30 tablet; Refill: 0 - topiramate (TOPAMAX) 25 MG tablet; Take 1 tablet (25 mg total) by mouth 2 (two) times daily.  Dispense: 60 tablet; Refill: 2       General Counseling: Deshauna verbalizes understanding of the findings of todays visit and agrees with plan of treatment. I have discussed any further diagnostic evaluation that may be needed or ordered today. We also reviewed her medications today. she has been encouraged to call the office with any questions or concerns that should arise related to todays visit.    No orders of the defined types were placed in this encounter.   Meds ordered this encounter  Medications   Diethylpropion HCl CR 75 MG TB24    Sig: Take 1 tablet (75 mg total) by mouth daily with breakfast.    Dispense:  30 tablet    Refill:  0   topiramate (TOPAMAX) 25 MG tablet  Sig: Take 1 tablet (25 mg total) by mouth 2 (two) times daily.     Dispense:  60 tablet    Refill:  2    Return in about 4 weeks (around 12/08/2022).  Time spent:30 Minutes Time spent with patient included reviewing progress notes, labs, imaging studies, and discussing plan for follow up.   [place] Controlled Substance Database was reviewed by me for overdose risk score (ORS)   This patient was seen by Sallyanne KusterAlyssa Danford Tat, FNP-C in collaboration with Dr. Beverely RisenFozia Khan as a part of collaborative care agreement.   Catelynn Sparger R. Tedd SiasAbernathy, MSN, FNP-C Internal Medicine

## 2022-11-11 ENCOUNTER — Telehealth: Payer: Self-pay

## 2022-11-14 NOTE — Telephone Encounter (Signed)
Spoke with pt that  she is feeling better she will callus back if any side effects

## 2022-11-21 ENCOUNTER — Telehealth: Payer: Self-pay

## 2022-11-21 NOTE — Telephone Encounter (Signed)
Lmom that to call us that we returning her call

## 2022-11-24 NOTE — Telephone Encounter (Signed)
Pt  advised that we discuss at visit to start new med

## 2022-11-30 ENCOUNTER — Ambulatory Visit: Payer: Self-pay | Admitting: Nurse Practitioner

## 2022-11-30 ENCOUNTER — Encounter: Payer: Self-pay | Admitting: Nurse Practitioner

## 2022-11-30 VITALS — BP 94/73 | HR 73 | Temp 98.5°F | Resp 16 | Ht 63.0 in | Wt 201.8 lb

## 2022-11-30 DIAGNOSIS — E6609 Other obesity due to excess calories: Secondary | ICD-10-CM

## 2022-11-30 DIAGNOSIS — R635 Abnormal weight gain: Secondary | ICD-10-CM | POA: Diagnosis not present

## 2022-11-30 DIAGNOSIS — Z6835 Body mass index (BMI) 35.0-35.9, adult: Secondary | ICD-10-CM

## 2022-11-30 NOTE — Progress Notes (Signed)
[facility] [address] [place], [place] [address]  Internal MEDICINE  Office Visit Note  Patient Name: Joan Johnson  [id number]  [id number]  Date of Service: 11/30/2022  Chief Complaint  Patient presents with   Hypertension   Follow-up    HPI Joan Johnson presents for a follow-up visit for weight loss Weight loss. --- tried phentermine with previous provider. Diethylpropion caused palpitations so she had to stop taking it. Her insurance does not cover wegovy, zepbound, saxenda or any other weight loss medication.  BMR is 1494 calories per day.   Current Medication: Outpatient Encounter Medications as of 11/30/2022  Medication Sig   ALPRAZolam (XANAX) 0.25 MG tablet Take 1 tablet (0.25 mg total) by mouth 2 (two) times daily as needed for anxiety.   benzonatate (TESSALON) 100 MG capsule Take 1 capsule (100 mg total) by mouth 2 (two) times daily as needed for cough.   chlorpheniramine-HYDROcodone (TUSSIONEX) 10-8 MG/5ML Take 5 mLs by mouth at bedtime as needed for cough.   EPINEPHrine 0.3 mg/0.3 mL IJ SOAJ injection Inject 0.3 mg into the muscle as needed for anaphylaxis.   hydroxychloroquine (PLAQUENIL) 200 MG tablet Take 200 mg by mouth daily.   mometasone (ELOCON) 0.1 % cream Use twice daily for up to 10 days in a row   rosuvastatin (CRESTOR) 10 MG tablet Take 1 tablet (10 mg total) by mouth daily.   topiramate (TOPAMAX) 25 MG tablet Take 1 tablet (25 mg total) by mouth 2 (two) times daily.   Vitamin D, Ergocalciferol, (DRISDOL) 1.25 MG (50000 UNIT) CAPS capsule Take 50,000 Units by mouth once a week.   [DISCONTINUED] Diethylpropion HCl CR 75 MG TB24 Take 1 tablet (75 mg total) by mouth daily with breakfast.   [DISCONTINUED] phentermine 15 MG capsule Take 15 mg by mouth daily.   losartan (COZAAR) 50 MG tablet Take 1 tablet (50 mg total) by mouth daily.   No facility-administered encounter medications on file as of 11/30/2022.    Surgical History: Past Surgical History:   Procedure Laterality Date   ANKLE SURGERY     AUGMENTATION MAMMAPLASTY     Breast Implant   CESAREAN SECTION     1991, 1989, 1987   COLONOSCOPY WITH PROPOFOL N/A 10/01/2020   Procedure: COLONOSCOPY WITH PROPOFOL;  Surgeon: Toney Reil, MD;  Location: [facility] ENDOSCOPY;  Service: Gastroenterology;  Laterality: N/A;   COSMETIC SURGERY     CYSTOSCOPY W/ RETROGRADES Left 07/19/2021   Procedure: CYSTOSCOPY WITH RETROGRADE PYELOGRAM;  Surgeon: Vanna Scotland, MD;  Location: [facility] ORS;  Service: Urology;  Laterality: Left;   CYSTOSCOPY/URETEROSCOPY/HOLMIUM LASER/STENT PLACEMENT Left 07/19/2021   Procedure: CYSTOSCOPY/URETEROSCOPY/HOLMIUM LASER/STENT PLACEMENT;  Surgeon: Vanna Scotland, MD;  Location: [facility] ORS;  Service: Urology;  Laterality: Left;   dental implants     ESOPHAGOGASTRODUODENOSCOPY (EGD) WITH PROPOFOL N/A 10/01/2020   Procedure: ESOPHAGOGASTRODUODENOSCOPY (EGD) WITH PROPOFOL;  Surgeon: Toney Reil, MD;  Location: [facility] ENDOSCOPY;  Service: Gastroenterology;  Laterality: N/A;   HERNIA REPAIR     OTHER SURGICAL HISTORY     tummy tuck   OTHER SURGICAL HISTORY     carpal tunnel   TUBAL LIGATION      Medical History: Past Medical History:  Diagnosis Date   Allergy    Anxiety    Arthritis    History of kidney stones    Hypertension    Migraine    Osteopenia 06/2021   DEXA at [facility]; spine and hip    Family History: Family History  Problem Relation  Age of Onset   Heart murmur Mother    Thyroid disease Daughter    Breast cancer Neg Hx    Ovarian cancer Neg Hx     Social History   Socioeconomic History   Marital status: Married    Spouse name: Jonetta Speak   Number of children: 2   Years of education: Not on file   Highest education level: 12th grade  Occupational History   Not on file  Tobacco Use   Smoking status: Former    Packs/day: 0.50    Years: 21.00    Additional pack years: 0.00    Total pack years: 10.50    Types: Cigarettes    Quit date:  03/29/2018    Years since quitting: 4.6    Passive exposure: Past   Smokeless tobacco: Never  Vaping Use   Vaping Use: Never used  Substance and Sexual Activity   Alcohol use: Yes    Alcohol/week: 2.0 standard drinks of alcohol    Types: 2 Glasses of wine per week    Comment: weekly   Drug use: Never   Sexual activity: Not Currently    Birth control/protection: Post-menopausal  Other Topics Concern   Not on file  Social History Narrative   ** Merged History Encounter **       Social Determinants of Health   Financial Resource Strain: Low Risk  (10/19/2022)   Overall Financial Resource Strain (CARDIA)    Difficulty of Paying Living Expenses: Not hard at all  Food Insecurity: No Food Insecurity (10/19/2022)   Hunger Vital Sign    Worried About Running Out of Food in the Last Year: Never true    Ran Out of Food in the Last Year: Never true  Transportation Needs: No Transportation Needs (10/19/2022)   PRAPARE - Administrator, Civil Service (Medical): No    Lack of Transportation (Non-Medical): No  Physical Activity: Not on file  Stress: No Stress Concern Present (10/19/2022)   [employer] of Occupational Health - Occupational Stress Questionnaire    Feeling of Stress : Not at all  Social Connections: Moderately Isolated (10/19/2022)   Social Connection and Isolation Panel [NHANES]    Frequency of Communication with Friends and Family: More than three times a week    Frequency of Social Gatherings with Friends and Family: More than three times a week    Attends Religious Services: Never    Database administrator or Organizations: No    Attends Engineer, structural: Not on file    Marital Status: Married  Catering manager Violence: Not on file      Review of Systems  Constitutional:  Positive for unexpected weight change. Negative for chills and fatigue.  HENT:  Negative for congestion, rhinorrhea, sneezing and sore throat.   Eyes:  Negative for  redness.  Respiratory:  Negative for cough, chest tightness and shortness of breath.   Cardiovascular:  Negative for chest pain and palpitations.  Gastrointestinal:  Negative for abdominal pain, constipation, diarrhea, nausea and vomiting.  Genitourinary:  Negative for dysuria and frequency.  Musculoskeletal:  Negative for arthralgias, back pain, joint swelling and neck pain.  Skin:  Negative for rash.  Neurological: Negative.  Negative for tremors and numbness.  Hematological:  Negative for adenopathy. Does not bruise/bleed easily.  Psychiatric/Behavioral:  Negative for behavioral problems (Depression), sleep disturbance and suicidal ideas. The patient is not nervous/anxious.     Vital Signs: BP 94/73   Pulse 73  Temp 98.5 F (36.9 C)   Resp 16   Ht 5\' 3"  (1.6 m)   Wt 201 lb 12.8 oz (91.5 kg)   SpO2 99%   BMI 35.75 kg/m    Physical Exam Vitals reviewed.  Constitutional:      General: She is not in acute distress.    Appearance: Normal appearance. She is obese. She is not ill-appearing.  HENT:     Head: Normocephalic and atraumatic.  Eyes:     Pupils: Pupils are equal, round, and reactive to light.  Cardiovascular:     Rate and Rhythm: Normal rate and regular rhythm.  Pulmonary:     Effort: Pulmonary effort is normal. No respiratory distress.  Neurological:     Mental Status: She is alert and oriented to person, place, and time.  Psychiatric:        Mood and Affect: Mood normal.        Behavior: Behavior normal.        Assessment/Plan: 1. Abnormal weight gain Ozempic sample provided to patient. Plans to work up to 2 mg dose which a friend has left over sealed and extra that the patient plans to use.  2. Class 2 obesity due to excess calories without serious comorbidity with body mass index (BMI) of 35.0 to 35.9 in adult Will try ozempic sample and then increased to 1 mg then 2 mg via extra sealed doses that her friend is giving to her    General Counseling:  Joan Johnson verbalizes understanding of the findings of todays visit and agrees with plan of treatment. I have discussed any further diagnostic evaluation that may be needed or ordered today. We also reviewed her medications today. she has been encouraged to call the office with any questions or concerns that should arise related to todays visit.    No orders of the defined types were placed in this encounter.   No orders of the defined types were placed in this encounter.   Return in about 6 weeks (around 01/11/2023) for F/U, Weight loss, Joan Johnson.   Total time spent:20 Minutes Time spent includes review of chart, medications, test results, and follow up plan with the patient.   [place] Controlled Substance Database was reviewed by me.  This patient was seen by Sallyanne Kuster, FNP-C in collaboration with Dr. Beverely Risen as a part of collaborative care agreement.   Jame Morrell R. Tedd Sias, MSN, FNP-C Internal medicine

## 2022-12-07 ENCOUNTER — Ambulatory Visit: Payer: Self-pay | Admitting: Nurse Practitioner

## 2022-12-10 ENCOUNTER — Other Ambulatory Visit: Payer: Self-pay | Admitting: Physician Assistant

## 2022-12-10 DIAGNOSIS — I10 Essential (primary) hypertension: Secondary | ICD-10-CM

## 2022-12-12 NOTE — Telephone Encounter (Signed)
Requested medications are due for refill today.  yes  Requested medications are on the active medications list.  yes  Last refill. 12/22/2021 #90 2 rf  Future visit scheduled.   yes  Notes to clinic.  Rx is expired. Alyssa is listed as PCP.    Requested Prescriptions  Pending Prescriptions Disp Refills   losartan (COZAAR) 50 MG tablet [Pharmacy Med Name: LOSARTAN 50MG  TABLETS] 90 tablet 2    Sig: TAKE 1 TABLET(50 MG) BY MOUTH DAILY     Cardiovascular:  Angiotensin Receptor Blockers Failed - 12/10/2022  8:04 AM      Failed - Cr in normal range and within 180 days    Creatinine, Ser  Date Value Ref Range Status  04/07/2022 0.70 0.57 - 1.00 mg/dL Final         Failed - K in normal range and within 180 days    Potassium  Date Value Ref Range Status  04/07/2022 4.6 3.5 - 5.2 mmol/L Final         Passed - Patient is not pregnant      Passed - Last BP in normal range    BP Readings from Last 1 Encounters:  11/30/22 94/73         Passed - Valid encounter within last 6 months    Recent Outpatient Visits           1 month ago Throat pain   [place] [facility], [place], PA-C   2 months ago Primary hypertension   [place] [facility] [place], [place], PA-C   5 months ago Chronic bilateral low back pain without sciatica   [facility] Alfredia Ferguson, PA-C   7 months ago Hyperglycemia   [place] [facility], [place], PA-C   8 months ago Annual physical exam   [facility] Alfredia Ferguson, PA-C       Future Appointments             In 3 months [place], [place] [facility], [facility]

## 2023-01-11 ENCOUNTER — Encounter: Payer: Self-pay | Admitting: Nurse Practitioner

## 2023-01-11 ENCOUNTER — Ambulatory Visit: Payer: Self-pay | Admitting: Nurse Practitioner

## 2023-01-11 VITALS — BP 134/88 | HR 88 | Temp 98.4°F | Resp 16 | Ht 63.0 in | Wt 204.0 lb

## 2023-01-11 DIAGNOSIS — Z6835 Body mass index (BMI) 35.0-35.9, adult: Secondary | ICD-10-CM | POA: Diagnosis not present

## 2023-01-11 DIAGNOSIS — E6609 Other obesity due to excess calories: Secondary | ICD-10-CM

## 2023-01-11 DIAGNOSIS — R635 Abnormal weight gain: Secondary | ICD-10-CM | POA: Diagnosis not present

## 2023-01-11 MED ORDER — PHENDIMETRAZINE TARTRATE ER 105 MG PO CP24: 105.0000 mg | ORAL_CAPSULE | Freq: Every day | ORAL | 1 refills | Status: DC

## 2023-01-11 NOTE — Progress Notes (Signed)
[facility] [address] [place], [place] [address]  Internal MEDICINE  Office Visit Note  Patient Name: Joan Johnson  [id number]  [id number]  Date of Service: 01/11/2023  Chief Complaint  Patient presents with   Hypertension   Follow-up    Weight loss     HPI Joan Johnson presents for a follow-up visit for weight loss management Has tried phentermine and diethylpropion. Could not get ozempic.  Has not lost any weight since her previous office visit.    Current Medication: Outpatient Encounter Medications as of 01/11/2023  Medication Sig   ALPRAZolam (XANAX) 0.25 MG tablet Take 1 tablet (0.25 mg total) by mouth 2 (two) times daily as needed for anxiety.   benzonatate (TESSALON) 100 MG capsule Take 1 capsule (100 mg total) by mouth 2 (two) times daily as needed for cough.   chlorpheniramine-HYDROcodone (TUSSIONEX) 10-8 MG/5ML Take 5 mLs by mouth at bedtime as needed for cough.   EPINEPHrine 0.3 mg/0.3 mL IJ SOAJ injection Inject 0.3 mg into the muscle as needed for anaphylaxis.   hydroxychloroquine (PLAQUENIL) 200 MG tablet Take 200 mg by mouth daily.   losartan (COZAAR) 50 MG tablet TAKE 1 TABLET(50 MG) BY MOUTH DAILY   mometasone (ELOCON) 0.1 % cream Use twice daily for up to 10 days in a row   Phendimetrazine Tartrate 105 MG CP24 Take 1 capsule (105 mg total) by mouth daily before breakfast.   rosuvastatin (CRESTOR) 10 MG tablet Take 1 tablet (10 mg total) by mouth daily.   topiramate (TOPAMAX) 25 MG tablet Take 1 tablet (25 mg total) by mouth 2 (two) times daily.   Vitamin D, Ergocalciferol, (DRISDOL) 1.25 MG (50000 UNIT) CAPS capsule Take 50,000 Units by mouth once a week.   No facility-administered encounter medications on file as of 01/11/2023.    Surgical History: Past Surgical History:  Procedure Laterality Date   ANKLE SURGERY     AUGMENTATION MAMMAPLASTY     Breast Implant   CESAREAN SECTION     1991, 1989, 1987   COLONOSCOPY WITH PROPOFOL N/A  10/01/2020   Procedure: COLONOSCOPY WITH PROPOFOL;  Surgeon: Toney Reil, MD;  Location: [facility] ENDOSCOPY;  Service: Gastroenterology;  Laterality: N/A;   COSMETIC SURGERY     CYSTOSCOPY W/ RETROGRADES Left 07/19/2021   Procedure: CYSTOSCOPY WITH RETROGRADE PYELOGRAM;  Surgeon: Vanna Scotland, MD;  Location: [facility] ORS;  Service: Urology;  Laterality: Left;   CYSTOSCOPY/URETEROSCOPY/HOLMIUM LASER/STENT PLACEMENT Left 07/19/2021   Procedure: CYSTOSCOPY/URETEROSCOPY/HOLMIUM LASER/STENT PLACEMENT;  Surgeon: Vanna Scotland, MD;  Location: [facility] ORS;  Service: Urology;  Laterality: Left;   dental implants     ESOPHAGOGASTRODUODENOSCOPY (EGD) WITH PROPOFOL N/A 10/01/2020   Procedure: ESOPHAGOGASTRODUODENOSCOPY (EGD) WITH PROPOFOL;  Surgeon: Toney Reil, MD;  Location: [facility] ENDOSCOPY;  Service: Gastroenterology;  Laterality: N/A;   HERNIA REPAIR     OTHER SURGICAL HISTORY     tummy tuck   OTHER SURGICAL HISTORY     carpal tunnel   TUBAL LIGATION      Medical History: Past Medical History:  Diagnosis Date   Allergy    Anxiety    Arthritis    History of kidney stones    Hypertension    Migraine    Osteopenia 06/2021   DEXA at [facility]; spine and hip    Family History: Family History  Problem Relation Age of Onset   Heart murmur Mother    Thyroid disease Daughter    Breast cancer Neg Hx    Ovarian cancer Neg Hx  Social History   Socioeconomic History   Marital status: Married    Spouse name: Jonetta Speak   Number of children: 2   Years of education: Not on file   Highest education level: 12th grade  Occupational History   Not on file  Tobacco Use   Smoking status: Former    Packs/day: 0.50    Years: 21.00    Additional pack years: 0.00    Total pack years: 10.50    Types: Cigarettes    Quit date: 03/29/2018    Years since quitting: 4.7    Passive exposure: Past   Smokeless tobacco: Never  Vaping Use   Vaping Use: Never used  Substance and Sexual Activity    Alcohol use: Yes    Alcohol/week: 2.0 standard drinks of alcohol    Types: 2 Glasses of wine per week    Comment: weekly   Drug use: Never   Sexual activity: Not Currently    Birth control/protection: Post-menopausal  Other Topics Concern   Not on file  Social History Narrative   ** Merged History Encounter **       Social Determinants of Health   Financial Resource Strain: Low Risk  (10/19/2022)   Overall Financial Resource Strain (CARDIA)    Difficulty of Paying Living Expenses: Not hard at all  Food Insecurity: No Food Insecurity (10/19/2022)   Hunger Vital Sign    Worried About Running Out of Food in the Last Year: Never true    Ran Out of Food in the Last Year: Never true  Transportation Needs: No Transportation Needs (10/19/2022)   PRAPARE - Administrator, Civil Service (Medical): No    Lack of Transportation (Non-Medical): No  Physical Activity: Not on file  Stress: No Stress Concern Present (10/19/2022)   [employer] of Occupational Health - Occupational Stress Questionnaire    Feeling of Stress : Not at all  Social Connections: Moderately Isolated (10/19/2022)   Social Connection and Isolation Panel [NHANES]    Frequency of Communication with Friends and Family: More than three times a week    Frequency of Social Gatherings with Friends and Family: More than three times a week    Attends Religious Services: Never    Database administrator or Organizations: No    Attends Engineer, structural: Not on file    Marital Status: Married  Catering manager Violence: Not on file      Review of Systems  Constitutional:  Positive for unexpected weight change. Negative for chills and fatigue.  HENT:  Negative for congestion, rhinorrhea, sneezing and sore throat.   Eyes:  Negative for redness.  Respiratory:  Negative for cough, chest tightness and shortness of breath.   Cardiovascular:  Negative for chest pain and palpitations.  Gastrointestinal:   Negative for abdominal pain, constipation, diarrhea, nausea and vomiting.  Genitourinary:  Negative for dysuria and frequency.  Musculoskeletal:  Negative for arthralgias, back pain, joint swelling and neck pain.  Skin:  Negative for rash.  Neurological: Negative.  Negative for tremors and numbness.  Hematological:  Negative for adenopathy. Does not bruise/bleed easily.  Psychiatric/Behavioral:  Negative for behavioral problems (Depression), sleep disturbance and suicidal ideas. The patient is not nervous/anxious.     Vital Signs: BP 134/88   Pulse 88   Temp 98.4 F (36.9 C)   Resp 16   Ht 5\' 3"  (1.6 m)   Wt 204 lb (92.5 kg)   SpO2 97%   BMI 36.14 kg/m  Physical Exam Vitals reviewed.  Constitutional:      General: She is not in acute distress.    Appearance: Normal appearance. She is obese. She is not ill-appearing.  HENT:     Head: Normocephalic and atraumatic.  Eyes:     Pupils: Pupils are equal, round, and reactive to light.  Cardiovascular:     Rate and Rhythm: Normal rate and regular rhythm.  Pulmonary:     Effort: Pulmonary effort is normal. No respiratory distress.  Neurological:     Mental Status: She is alert and oriented to person, place, and time.  Psychiatric:        Mood and Affect: Mood normal.        Behavior: Behavior normal.        Assessment/Plan: 1. Abnormal weight gain Start phendimetrazine as prescribed and restart topiramate. Follow up in 8 weeks.  - Phendimetrazine Tartrate 105 MG CP24; Take 1 capsule (105 mg total) by mouth daily before breakfast.  Dispense: 30 capsule; Refill: 1  2. Class 2 obesity due to excess calories without serious comorbidity with body mass index (BMI) of 35.0 to 35.9 in adult Take phendemetrazine and topiramate as prescribed, follow up in 8 weeks. - Phendimetrazine Tartrate 105 MG CP24; Take 1 capsule (105 mg total) by mouth daily before breakfast.  Dispense: 30 capsule; Refill: 1   General Counseling: Merrilyn  verbalizes understanding of the findings of todays visit and agrees with plan of treatment. I have discussed any further diagnostic evaluation that may be needed or ordered today. We also reviewed her medications today. she has been encouraged to call the office with any questions or concerns that should arise related to todays visit.    No orders of the defined types were placed in this encounter.   Meds ordered this encounter  Medications   Phendimetrazine Tartrate 105 MG CP24    Sig: Take 1 capsule (105 mg total) by mouth daily before breakfast.    Dispense:  30 capsule    Refill:  1    Do not run through insurance, patient will have goodrx coupon.    Return in about 8 weeks (around 03/08/2023) for F/U, Weight loss, Ashan Cueva PCP.   Total time spent:30 Minutes Time spent includes review of chart, medications, test results, and follow up plan with the patient.   [place] Controlled Substance Database was reviewed by me.  This patient was seen by Sallyanne Kuster, FNP-C in collaboration with Dr. Beverely Risen as a part of collaborative care agreement.   Safiyya Stokes R. Tedd Sias, MSN, FNP-C Internal medicine

## 2023-02-08 ENCOUNTER — Telehealth: Payer: Self-pay | Admitting: Nurse Practitioner

## 2023-02-08 NOTE — Telephone Encounter (Signed)
Spoke with pt she should have refill at [facility]

## 2023-02-18 ENCOUNTER — Other Ambulatory Visit: Payer: Self-pay | Admitting: Nurse Practitioner

## 2023-02-18 DIAGNOSIS — R635 Abnormal weight gain: Secondary | ICD-10-CM

## 2023-02-18 DIAGNOSIS — E6609 Other obesity due to excess calories: Secondary | ICD-10-CM

## 2023-03-03 ENCOUNTER — Ambulatory Visit: Payer: Self-pay | Admitting: Nurse Practitioner

## 2023-03-03 ENCOUNTER — Encounter: Payer: Self-pay | Admitting: Nurse Practitioner

## 2023-03-03 VITALS — BP 136/88 | HR 83 | Temp 98.7°F | Resp 16 | Ht 63.0 in | Wt 199.8 lb

## 2023-03-03 DIAGNOSIS — Z6835 Body mass index (BMI) 35.0-35.9, adult: Secondary | ICD-10-CM

## 2023-03-03 DIAGNOSIS — R635 Abnormal weight gain: Secondary | ICD-10-CM

## 2023-03-03 DIAGNOSIS — E6609 Other obesity due to excess calories: Secondary | ICD-10-CM

## 2023-03-03 MED ORDER — PHENDIMETRAZINE TARTRATE ER 105 MG PO CP24
105.0000 mg | ORAL_CAPSULE | Freq: Every day | ORAL | 1 refills | Status: DC
Start: 2023-03-03 — End: 2023-03-14

## 2023-03-03 NOTE — Progress Notes (Signed)
[facility] [address] [place], [place] [address]  Internal MEDICINE  Office Visit Note  Patient Name: Joan Johnson  [id number]  [id number]  Date of Service: 03/03/2023  Chief Complaint  Patient presents with   Follow-up    Weight loss     HPI Carmen presents for a follow-up visit for weight loss.  Lost about 5 lbs since last visit, has a sample of ozempic that she will start using soon. She would like to continue taking topiramate and phendimetrazine. Feels like her appetite is starting to decrease now.  Her PCP has moved to another clinic in [place], [place]. Patient has decided to establish at [facility] for PCP because it is more convenient for her.     Current Medication: Outpatient Encounter Medications as of 03/03/2023  Medication Sig   ALPRAZolam (XANAX) 0.25 MG tablet Take 1 tablet (0.25 mg total) by mouth 2 (two) times daily as needed for anxiety.   benzonatate (TESSALON) 100 MG capsule Take 1 capsule (100 mg total) by mouth 2 (two) times daily as needed for cough.   chlorpheniramine-HYDROcodone (TUSSIONEX) 10-8 MG/5ML Take 5 mLs by mouth at bedtime as needed for cough.   EPINEPHrine 0.3 mg/0.3 mL IJ SOAJ injection Inject 0.3 mg into the muscle as needed for anaphylaxis.   hydroxychloroquine (PLAQUENIL) 200 MG tablet Take 200 mg by mouth daily.   losartan (COZAAR) 50 MG tablet TAKE 1 TABLET(50 MG) BY MOUTH DAILY   mometasone (ELOCON) 0.1 % cream Use twice daily for up to 10 days in a row   rosuvastatin (CRESTOR) 10 MG tablet Take 1 tablet (10 mg total) by mouth daily.   topiramate (TOPAMAX) 25 MG tablet TAKE 1 TABLET(25 MG) BY MOUTH TWICE DAILY   Vitamin D, Ergocalciferol, (DRISDOL) 1.25 MG (50000 UNIT) CAPS capsule Take 50,000 Units by mouth once a week.   [DISCONTINUED] Phendimetrazine Tartrate 105 MG CP24 Take 1 capsule (105 mg total) by mouth daily before breakfast.   Phendimetrazine Tartrate 105 MG CP24 Take 1 capsule (105 mg total) by  mouth daily before breakfast.   No facility-administered encounter medications on file as of 03/03/2023.    Surgical History: Past Surgical History:  Procedure Laterality Date   ANKLE SURGERY     AUGMENTATION MAMMAPLASTY     Breast Implant   CESAREAN SECTION     1991, 1989, 1987   COLONOSCOPY WITH PROPOFOL N/A 10/01/2020   Procedure: COLONOSCOPY WITH PROPOFOL;  Surgeon: Toney Reil, MD;  Location: [facility] ENDOSCOPY;  Service: Gastroenterology;  Laterality: N/A;   COSMETIC SURGERY     CYSTOSCOPY W/ RETROGRADES Left 07/19/2021   Procedure: CYSTOSCOPY WITH RETROGRADE PYELOGRAM;  Surgeon: Vanna Scotland, MD;  Location: [facility] ORS;  Service: Urology;  Laterality: Left;   CYSTOSCOPY/URETEROSCOPY/HOLMIUM LASER/STENT PLACEMENT Left 07/19/2021   Procedure: CYSTOSCOPY/URETEROSCOPY/HOLMIUM LASER/STENT PLACEMENT;  Surgeon: Vanna Scotland, MD;  Location: [facility] ORS;  Service: Urology;  Laterality: Left;   dental implants     ESOPHAGOGASTRODUODENOSCOPY (EGD) WITH PROPOFOL N/A 10/01/2020   Procedure: ESOPHAGOGASTRODUODENOSCOPY (EGD) WITH PROPOFOL;  Surgeon: Toney Reil, MD;  Location: [facility] ENDOSCOPY;  Service: Gastroenterology;  Laterality: N/A;   HERNIA REPAIR     OTHER SURGICAL HISTORY     tummy tuck   OTHER SURGICAL HISTORY     carpal tunnel   TUBAL LIGATION      Medical History: Past Medical History:  Diagnosis Date   Allergy    Anxiety    Arthritis    History of kidney stones  Hypertension    Migraine    Osteopenia 06/2021   DEXA at [facility]; spine and hip    Family History: Family History  Problem Relation Age of Onset   Heart murmur Mother    Thyroid disease Daughter    Breast cancer Neg Hx    Ovarian cancer Neg Hx     Social History   Socioeconomic History   Marital status: Married    Spouse name: Luis   Number of children: 2   Years of education: Not on file   Highest education level: 12th grade  Occupational History   Not on file  Tobacco Use    Smoking status: Former    Current packs/day: 0.00    Average packs/day: 0.5 packs/day for 21.0 years (10.5 ttl pk-yrs)    Types: Cigarettes    Start date: 03/29/1997    Quit date: 03/29/2018    Years since quitting: 4.9    Passive exposure: Past   Smokeless tobacco: Never  Vaping Use   Vaping status: Never Used  Substance and Sexual Activity   Alcohol use: Yes    Alcohol/week: 2.0 standard drinks of alcohol    Types: 2 Glasses of wine per week    Comment: weekly   Drug use: Never   Sexual activity: Not Currently    Birth control/protection: Post-menopausal  Other Topics Concern   Not on file  Social History Narrative   ** Merged History Encounter **       Social Determinants of Health   Financial Resource Strain: Low Risk  (10/19/2022)   Overall Financial Resource Strain (CARDIA)    Difficulty of Paying Living Expenses: Not hard at all  Food Insecurity: No Food Insecurity (10/19/2022)   Hunger Vital Sign    Worried About Running Out of Food in the Last Year: Never true    Ran Out of Food in the Last Year: Never true  Transportation Needs: No Transportation Needs (10/19/2022)   PRAPARE - Administrator, Civil Service (Medical): No    Lack of Transportation (Non-Medical): No  Physical Activity: Not on file  Stress: No Stress Concern Present (10/19/2022)   [employer] of Occupational Health - Occupational Stress Questionnaire    Feeling of Stress : Not at all  Social Connections: Moderately Isolated (10/19/2022)   Social Connection and Isolation Panel [NHANES]    Frequency of Communication with Friends and Family: More than three times a week    Frequency of Social Gatherings with Friends and Family: More than three times a week    Attends Religious Services: Never    Database administrator or Organizations: No    Attends Engineer, structural: Not on file    Marital Status: Married  Catering manager Violence: Not on file      Review of Systems   Constitutional:  Positive for unexpected weight change. Negative for chills and fatigue.  HENT:  Negative for congestion, rhinorrhea, sneezing and sore throat.   Eyes:  Negative for redness.  Respiratory:  Negative for cough, chest tightness and shortness of breath.   Cardiovascular:  Negative for chest pain and palpitations.  Gastrointestinal:  Negative for abdominal pain, constipation, diarrhea, nausea and vomiting.  Genitourinary:  Negative for dysuria and frequency.  Musculoskeletal:  Negative for arthralgias, back pain, joint swelling and neck pain.  Skin:  Negative for rash.  Neurological: Negative.  Negative for tremors and numbness.  Hematological:  Negative for adenopathy. Does not bruise/bleed easily.  Psychiatric/Behavioral:  Negative for behavioral problems (Depression), sleep disturbance and suicidal ideas. The patient is not nervous/anxious.     Vital Signs: BP 136/88   Pulse 83   Temp 98.7 F (37.1 C)   Resp 16   Ht 5\' 3"  (1.6 m)   Wt 199 lb 12.8 oz (90.6 kg)   SpO2 98%   BMI 35.39 kg/m    Physical Exam Vitals reviewed.  Constitutional:      General: She is not in acute distress.    Appearance: Normal appearance. She is obese. She is not ill-appearing.  HENT:     Head: Normocephalic and atraumatic.  Eyes:     Pupils: Pupils are equal, round, and reactive to light.  Cardiovascular:     Rate and Rhythm: Normal rate and regular rhythm.  Pulmonary:     Effort: Pulmonary effort is normal. No respiratory distress.  Neurological:     Mental Status: She is alert and oriented to person, place, and time.  Psychiatric:        Mood and Affect: Mood normal.        Behavior: Behavior normal.        Assessment/Plan: 1. Abnormal weight gain Continue phendimetrazine and topiramate as prescribed. May take ozempic sample along with those 2 medications. Follow up in 8 weeks  - Phendimetrazine Tartrate 105 MG CP24; Take 1 capsule (105 mg total) by mouth daily before  breakfast.  Dispense: 30 capsule; Refill: 1  2. Class 2 obesity due to excess calories without serious comorbidity with body mass index (BMI) of 35.0 to 35.9 in adult Continue medications, follow up in 8 weeks.  - Phendimetrazine Tartrate 105 MG CP24; Take 1 capsule (105 mg total) by mouth daily before breakfast.  Dispense: 30 capsule; Refill: 1   General Counseling: Kalika verbalizes understanding of the findings of todays visit and agrees with plan of treatment. I have discussed any further diagnostic evaluation that may be needed or ordered today. We also reviewed her medications today. she has been encouraged to call the office with any questions or concerns that should arise related to todays visit.    No orders of the defined types were placed in this encounter.   Meds ordered this encounter  Medications   Phendimetrazine Tartrate 105 MG CP24    Sig: Take 1 capsule (105 mg total) by mouth daily before breakfast.    Dispense:  30 capsule    Refill:  1    Do not run through insurance, patient will have goodrx coupon.    Return in about 8 weeks (around 04/28/2023) for F/U, Weight loss, CPE,  PCP.   Total time spent:20 Minutes Time spent includes review of chart, medications, test results, and follow up plan with the patient.   [place] Controlled Substance Database was reviewed by me.  This patient was seen by Sallyanne Kuster, FNP-C in collaboration with Dr. Beverely Risen as a part of collaborative care agreement.    R. Tedd Sias, MSN, FNP-C Internal medicine

## 2023-03-08 ENCOUNTER — Ambulatory Visit: Payer: Self-pay | Admitting: Nurse Practitioner

## 2023-03-10 ENCOUNTER — Other Ambulatory Visit: Payer: Self-pay | Admitting: Nurse Practitioner

## 2023-03-10 DIAGNOSIS — R635 Abnormal weight gain: Secondary | ICD-10-CM

## 2023-03-10 DIAGNOSIS — E6609 Other obesity due to excess calories: Secondary | ICD-10-CM

## 2023-03-13 ENCOUNTER — Other Ambulatory Visit: Payer: Self-pay

## 2023-03-13 DIAGNOSIS — E6609 Other obesity due to excess calories: Secondary | ICD-10-CM

## 2023-03-13 DIAGNOSIS — R635 Abnormal weight gain: Secondary | ICD-10-CM

## 2023-03-14 ENCOUNTER — Other Ambulatory Visit: Payer: Self-pay

## 2023-03-14 MED ORDER — PHENDIMETRAZINE TARTRATE ER 105 MG PO CP24
105.0000 mg | ORAL_CAPSULE | Freq: Every day | ORAL | 1 refills | Status: DC
Start: 2023-03-14 — End: 2023-04-11

## 2023-04-10 ENCOUNTER — Ambulatory Visit: Payer: 59 | Admitting: Physician Assistant

## 2023-04-11 ENCOUNTER — Ambulatory Visit: Payer: Self-pay | Admitting: Nurse Practitioner

## 2023-04-11 ENCOUNTER — Encounter: Payer: Self-pay | Admitting: Nurse Practitioner

## 2023-04-11 VITALS — BP 130/98 | HR 105 | Temp 98.8°F | Resp 16 | Ht 63.0 in | Wt 195.8 lb

## 2023-04-11 DIAGNOSIS — Z0000 Encounter for general adult medical examination without abnormal findings: Secondary | ICD-10-CM

## 2023-04-11 DIAGNOSIS — R635 Abnormal weight gain: Secondary | ICD-10-CM

## 2023-04-11 DIAGNOSIS — R42 Dizziness and giddiness: Secondary | ICD-10-CM

## 2023-04-11 DIAGNOSIS — E6609 Other obesity due to excess calories: Secondary | ICD-10-CM | POA: Diagnosis not present

## 2023-04-11 DIAGNOSIS — E538 Deficiency of other specified B group vitamins: Secondary | ICD-10-CM | POA: Diagnosis not present

## 2023-04-11 DIAGNOSIS — Z6835 Body mass index (BMI) 35.0-35.9, adult: Secondary | ICD-10-CM

## 2023-04-11 DIAGNOSIS — E782 Mixed hyperlipidemia: Secondary | ICD-10-CM

## 2023-04-11 MED ORDER — CYANOCOBALAMIN 1000 MCG/ML IJ SOLN
1000.0000 ug | Freq: Once | INTRAMUSCULAR | Status: DC
Start: 2023-04-11 — End: 2023-09-22

## 2023-04-11 MED ORDER — PHENDIMETRAZINE TARTRATE ER 105 MG PO CP24
105.0000 mg | ORAL_CAPSULE | Freq: Every day | ORAL | 1 refills | Status: DC
Start: 2023-04-11 — End: 2023-05-08

## 2023-04-11 MED ORDER — HYDROXYZINE HCL 10 MG PO TABS
10.0000 mg | ORAL_TABLET | Freq: Three times a day (TID) | ORAL | 1 refills | Status: DC | PRN
Start: 2023-04-11 — End: 2023-09-05

## 2023-04-11 NOTE — Progress Notes (Signed)
[facility] [address] [place], [place] [address]  Internal MEDICINE  Office Visit Note  Patient Name: Joan Johnson  [id number]  [id number]  Date of Service: 04/11/2023  Chief Complaint  Patient presents with   Acute Visit    Lightheaded x 2 days.      HPI Rosaida presents for an acute sick visit for lightheadedness/vertigo Vertigo -- concern for fatigue, vitamin deficiency  B12, low energy Meclizine makes her too tired     Current Medication:  Outpatient Encounter Medications as of 04/11/2023  Medication Sig   ALPRAZolam (XANAX) 0.25 MG tablet Take 1 tablet (0.25 mg total) by mouth 2 (two) times daily as needed for anxiety.   benzonatate (TESSALON) 100 MG capsule Take 1 capsule (100 mg total) by mouth 2 (two) times daily as needed for cough.   chlorpheniramine-HYDROcodone (TUSSIONEX) 10-8 MG/5ML Take 5 mLs by mouth at bedtime as needed for cough.   EPINEPHrine 0.3 mg/0.3 mL IJ SOAJ injection Inject 0.3 mg into the muscle as needed for anaphylaxis.   hydroxychloroquine (PLAQUENIL) 200 MG tablet Take 200 mg by mouth daily.   hydrOXYzine (ATARAX) 10 MG tablet Take 1-2 tablets (10-20 mg total) by mouth 3 (three) times daily as needed (dizziness).   losartan (COZAAR) 50 MG tablet TAKE 1 TABLET(50 MG) BY MOUTH DAILY   mometasone (ELOCON) 0.1 % cream Use twice daily for up to 10 days in a row   rosuvastatin (CRESTOR) 10 MG tablet Take 1 tablet (10 mg total) by mouth daily.   [DISCONTINUED] Phendimetrazine Tartrate 105 MG CP24 Take 1 capsule (105 mg total) by mouth daily before breakfast.   [DISCONTINUED] topiramate (TOPAMAX) 25 MG tablet TAKE 1 TABLET(25 MG) BY MOUTH TWICE DAILY   [DISCONTINUED] Vitamin D, Ergocalciferol, (DRISDOL) 1.25 MG (50000 UNIT) CAPS capsule Take 50,000 Units by mouth once a week.   [DISCONTINUED] Phendimetrazine Tartrate 105 MG CP24 Take 1 capsule (105 mg total) by mouth daily before breakfast.   Facility-Administered Encounter Medications  as of 04/11/2023  Medication   cyanocobalamin (VITAMIN B12) injection 1,000 mcg      Medical History: Past Medical History:  Diagnosis Date   Allergy    Anxiety    Arthritis    History of kidney stones    Hypertension    Migraine    Osteopenia 06/2021   DEXA at [facility]; spine and hip     Vital Signs: BP (!) 130/98   Pulse (!) 105   Temp 98.8 F (37.1 C)   Resp 16   Ht 5\' 3"  (1.6 m)   Wt 195 lb 12.8 oz (88.8 kg)   SpO2 98%   BMI 34.68 kg/m    Review of Systems  Constitutional:  Positive for unexpected weight change. Negative for chills and fatigue.  HENT:  Negative for congestion, rhinorrhea, sneezing and sore throat.   Eyes:  Negative for redness.  Respiratory:  Negative for cough, chest tightness and shortness of breath.   Cardiovascular:  Negative for chest pain and palpitations.  Gastrointestinal:  Negative for abdominal pain, constipation, diarrhea, nausea and vomiting.  Genitourinary:  Negative for dysuria and frequency.  Musculoskeletal:  Negative for arthralgias, back pain, joint swelling and neck pain.  Skin:  Negative for rash.  Neurological:  Positive for dizziness and light-headedness. Negative for tremors and numbness.  Hematological:  Negative for adenopathy. Does not bruise/bleed easily.  Psychiatric/Behavioral:  Negative for behavioral problems (Depression), sleep disturbance and suicidal ideas. The patient is not nervous/anxious.  Physical Exam Vitals reviewed.  Constitutional:      General: She is not in acute distress.    Appearance: Normal appearance. She is obese. She is not ill-appearing.  HENT:     Head: Normocephalic and atraumatic.  Eyes:     Pupils: Pupils are equal, round, and reactive to light.  Cardiovascular:     Rate and Rhythm: Normal rate and regular rhythm.  Pulmonary:     Effort: Pulmonary effort is normal. No respiratory distress.  Neurological:     Mental Status: She is alert and oriented to person, place, and time.   Psychiatric:        Mood and Affect: Mood normal.        Behavior: Behavior normal.       Assessment/Plan: 1. Vertigo Will try hydroxyzine as needed since patient does not tolerate meclizine.  - hydrOXYzine (ATARAX) 10 MG tablet; Take 1-2 tablets (10-20 mg total) by mouth 3 (three) times daily as needed (dizziness).  Dispense: 90 tablet; Refill: 1  2. B12 deficiency B12 injection administered in office today  - cyanocobalamin (VITAMIN B12) injection 1,000 mcg  3. Mixed hyperlipidemia Routine labs ordered  - CBC with Differential/Platelet - CMP14+EGFR - Lipid Profile  4. Class 2 obesity due to excess calories without serious comorbidity with body mass index (BMI) of 35.0 to 35.9 in adult Routine labs ordered  - CBC with Differential/Platelet - CMP14+EGFR - Lipid Profile  5. Routine health maintenance Routine labs ordered  - CBC with Differential/Platelet - CMP14+EGFR - Lipid Profile   General Counseling: Dare verbalizes understanding of the findings of todays visit and agrees with plan of treatment. I have discussed any further diagnostic evaluation that may be needed or ordered today. We also reviewed her medications today. she has been encouraged to call the office with any questions or concerns that should arise related to todays visit.    Counseling:    Orders Placed This Encounter  Procedures   CBC with Differential/Platelet   CMP14+EGFR   Lipid Profile    Meds ordered this encounter  Medications   hydrOXYzine (ATARAX) 10 MG tablet    Sig: Take 1-2 tablets (10-20 mg total) by mouth 3 (three) times daily as needed (dizziness).    Dispense:  90 tablet    Refill:  1   cyanocobalamin (VITAMIN B12) injection 1,000 mcg   DISCONTD: Phendimetrazine Tartrate 105 MG CP24    Sig: Take 1 capsule (105 mg total) by mouth daily before breakfast.    Dispense:  30 capsule    Refill:  1    Do not run through insurance, patient will have goodrx coupon.    Return  for F/U, Labs, Carole Deere PCP.  [place] Controlled Substance Database was reviewed by me for overdose risk score (ORS)  Time spent:30 Minutes Time spent with patient included reviewing progress notes, labs, imaging studies, and discussing plan for follow up.   This patient was seen by Sallyanne Kuster, FNP-C in collaboration with Dr. Beverely Risen as a part of collaborative care agreement.  Sheppard Luckenbach R. Tedd Sias, MSN, FNP-C Internal Medicine

## 2023-04-26 LAB — CMP14+EGFR
ALT: 25 IU/L (ref 0–32)
AST: 18 IU/L (ref 0–40)
Albumin: 4.3 g/dL (ref 3.8–4.9)
Alkaline Phosphatase: 57 IU/L (ref 44–121)
BUN/Creatinine Ratio: 13 (ref 9–23)
BUN: 10 mg/dL (ref 6–24)
Bilirubin Total: 1.1 mg/dL (ref 0.0–1.2)
CO2: 25 mmol/L (ref 20–29)
Calcium: 10.1 mg/dL (ref 8.7–10.2)
Chloride: 105 mmol/L (ref 96–106)
Creatinine, Ser: 0.75 mg/dL (ref 0.57–1.00)
Globulin, Total: 2.1 g/dL (ref 1.5–4.5)
Glucose: 78 mg/dL (ref 70–99)
Potassium: 5.6 mmol/L — ABNORMAL HIGH (ref 3.5–5.2)
Sodium: 142 mmol/L (ref 134–144)
Total Protein: 6.4 g/dL (ref 6.0–8.5)
eGFR: 95 mL/min/{1.73_m2} (ref >59)

## 2023-04-26 LAB — LIPID PANEL
Chol/HDL Ratio: 4.3 ratio (ref 0.0–4.4)
Cholesterol, Total: 219 mg/dL — ABNORMAL HIGH (ref 100–199)
HDL: 51 mg/dL (ref >39)
LDL Chol Calc (NIH): 152 mg/dL — ABNORMAL HIGH (ref 0–99)
Triglycerides: 91 mg/dL (ref 0–149)
VLDL Cholesterol Cal: 16 mg/dL (ref 5–40)

## 2023-04-26 LAB — CBC WITH DIFFERENTIAL/PLATELET
Basophils Absolute: 0.1 10*3/uL (ref 0.0–0.2)
Basos: 1 %
EOS (ABSOLUTE): 0.2 10*3/uL (ref 0.0–0.4)
Eos: 4 %
Hematocrit: 41.4 % (ref 34.0–46.6)
Hemoglobin: 13.3 g/dL (ref 11.1–15.9)
Immature Grans (Abs): 0 10*3/uL (ref 0.0–0.1)
Immature Granulocytes: 0 %
Lymphocytes Absolute: 1.6 10*3/uL (ref 0.7–3.1)
Lymphs: 31 %
MCH: 30.3 pg (ref 26.6–33.0)
MCHC: 32.1 g/dL (ref 31.5–35.7)
MCV: 94 fL (ref 79–97)
Monocytes Absolute: 0.5 10*3/uL (ref 0.1–0.9)
Monocytes: 11 %
Neutrophils Absolute: 2.7 10*3/uL (ref 1.4–7.0)
Neutrophils: 53 %
Platelets: 258 10*3/uL (ref 150–450)
RBC: 4.39 x10E6/uL (ref 3.77–5.28)
RDW: 12.2 % (ref 11.7–15.4)
WBC: 5.1 10*3/uL (ref 3.4–10.8)

## 2023-05-08 ENCOUNTER — Ambulatory Visit: Payer: Self-pay | Admitting: Nurse Practitioner

## 2023-05-08 ENCOUNTER — Encounter: Payer: Self-pay | Admitting: Nurse Practitioner

## 2023-05-08 VITALS — BP 130/72 | HR 107 | Temp 98.4°F | Resp 16 | Ht 63.0 in | Wt 190.2 lb

## 2023-05-08 DIAGNOSIS — E782 Mixed hyperlipidemia: Secondary | ICD-10-CM

## 2023-05-08 DIAGNOSIS — E6609 Other obesity due to excess calories: Secondary | ICD-10-CM

## 2023-05-08 DIAGNOSIS — E875 Hyperkalemia: Secondary | ICD-10-CM

## 2023-05-08 DIAGNOSIS — E538 Deficiency of other specified B group vitamins: Secondary | ICD-10-CM | POA: Diagnosis not present

## 2023-05-08 DIAGNOSIS — Z6835 Body mass index (BMI) 35.0-35.9, adult: Secondary | ICD-10-CM

## 2023-05-08 DIAGNOSIS — R635 Abnormal weight gain: Secondary | ICD-10-CM

## 2023-05-08 DIAGNOSIS — E66812 Obesity, class 2: Secondary | ICD-10-CM

## 2023-05-08 MED ORDER — CYANOCOBALAMIN 1000 MCG/ML IJ SOLN
1000.0000 ug | Freq: Once | INTRAMUSCULAR | Status: DC
Start: 2023-05-08 — End: 2023-09-22

## 2023-05-08 MED ORDER — TOPIRAMATE 25 MG PO TABS
ORAL_TABLET | ORAL | 2 refills | Status: DC
Start: 2023-05-08 — End: 2023-08-21

## 2023-05-08 MED ORDER — PHENDIMETRAZINE TARTRATE ER 105 MG PO CP24
105.0000 mg | ORAL_CAPSULE | Freq: Every day | ORAL | 1 refills | Status: DC
Start: 2023-05-08 — End: 2023-07-03

## 2023-05-08 NOTE — Patient Instructions (Signed)
Potassium Potassium affects how steadily your heart beats. Too much potassium in your blood can cause an irregular heartbeat or even a heart attack. You may need to limit foods that are high in potassium, such as: Liquid milk and soy milk. Salt substitutes that contain potassium. Fruits like bananas, apricots, nectarines, melon, prunes, raisins, kiwi, and oranges. Vegetables, such as potatoes, sweet potatoes, yams, tomatoes, leafy greens, beets, avocado, pumpkin, and winter squash. Beans, like lima beans. Nuts.

## 2023-05-08 NOTE — Progress Notes (Signed)
[facility] [address] [place], [place] [address]  Internal MEDICINE  Office Visit Note  Patient Name: Joan Johnson  [id number]  [id number]  Date of Service: 05/08/2023  Chief Complaint  Patient presents with   Follow-up    Weight lose    HPI Joan Johnson presents for a follow-up visit for labs and weight loss.  Weight loss -- lost 5 more lbs  Potassium level is elevated at 5.6 -- most likely dietary intake is the issue. Patient is eating nectarines and nuts every day. High cholesterol -- no decrease noted yet, but will continue with weight loss and recheck in 3 months.    Current Medication: Outpatient Encounter Medications as of 05/08/2023  Medication Sig   ALPRAZolam (XANAX) 0.25 MG tablet Take 1 tablet (0.25 mg total) by mouth 2 (two) times daily as needed for anxiety.   benzonatate (TESSALON) 100 MG capsule Take 1 capsule (100 mg total) by mouth 2 (two) times daily as needed for cough.   chlorpheniramine-HYDROcodone (TUSSIONEX) 10-8 MG/5ML Take 5 mLs by mouth at bedtime as needed for cough.   EPINEPHrine 0.3 mg/0.3 mL IJ SOAJ injection Inject 0.3 mg into the muscle as needed for anaphylaxis.   hydroxychloroquine (PLAQUENIL) 200 MG tablet Take 200 mg by mouth daily.   hydrOXYzine (ATARAX) 10 MG tablet Take 1-2 tablets (10-20 mg total) by mouth 3 (three) times daily as needed (dizziness).   losartan (COZAAR) 50 MG tablet TAKE 1 TABLET(50 MG) BY MOUTH DAILY   mometasone (ELOCON) 0.1 % cream Use twice daily for up to 10 days in a row   rosuvastatin (CRESTOR) 10 MG tablet Take 1 tablet (10 mg total) by mouth daily.   Vitamin D, Ergocalciferol, (DRISDOL) 1.25 MG (50000 UNIT) CAPS capsule Take 50,000 Units by mouth once a week.   [DISCONTINUED] Phendimetrazine Tartrate 105 MG CP24 Take 1 capsule (105 mg total) by mouth daily before breakfast.   [DISCONTINUED] topiramate (TOPAMAX) 25 MG tablet TAKE 1 TABLET(25 MG) BY MOUTH TWICE DAILY   Phendimetrazine Tartrate 105 MG  CP24 Take 1 capsule (105 mg total) by mouth daily before breakfast.   topiramate (TOPAMAX) 25 MG tablet TAKE 1 TABLET(25 MG) BY MOUTH TWICE DAILY   Facility-Administered Encounter Medications as of 05/08/2023  Medication   cyanocobalamin (VITAMIN B12) injection 1,000 mcg   cyanocobalamin (VITAMIN B12) injection 1,000 mcg    Surgical History: Past Surgical History:  Procedure Laterality Date   ANKLE SURGERY     AUGMENTATION MAMMAPLASTY     Breast Implant   CESAREAN SECTION     1991, 1989, 1987   COLONOSCOPY WITH PROPOFOL N/A 10/01/2020   Procedure: COLONOSCOPY WITH PROPOFOL;  Surgeon: Toney Reil, MD;  Location: [facility] ENDOSCOPY;  Service: Gastroenterology;  Laterality: N/A;   COSMETIC SURGERY     CYSTOSCOPY W/ RETROGRADES Left 07/19/2021   Procedure: CYSTOSCOPY WITH RETROGRADE PYELOGRAM;  Surgeon: Vanna Scotland, MD;  Location: [facility] ORS;  Service: Urology;  Laterality: Left;   CYSTOSCOPY/URETEROSCOPY/HOLMIUM LASER/STENT PLACEMENT Left 07/19/2021   Procedure: CYSTOSCOPY/URETEROSCOPY/HOLMIUM LASER/STENT PLACEMENT;  Surgeon: Vanna Scotland, MD;  Location: [facility] ORS;  Service: Urology;  Laterality: Left;   dental implants     ESOPHAGOGASTRODUODENOSCOPY (EGD) WITH PROPOFOL N/A 10/01/2020   Procedure: ESOPHAGOGASTRODUODENOSCOPY (EGD) WITH PROPOFOL;  Surgeon: Toney Reil, MD;  Location: [facility] ENDOSCOPY;  Service: Gastroenterology;  Laterality: N/A;   HERNIA REPAIR     OTHER SURGICAL HISTORY     tummy tuck   OTHER SURGICAL HISTORY     carpal tunnel  TUBAL LIGATION      Medical History: Past Medical History:  Diagnosis Date   Allergy    Anxiety    Arthritis    History of kidney stones    Hypertension    Migraine    Osteopenia 06/2021   DEXA at [facility]; spine and hip    Family History: Family History  Problem Relation Age of Onset   Heart murmur Mother    Thyroid disease Daughter    Breast cancer Neg Hx    Ovarian cancer Neg Hx     Social History    Socioeconomic History   Marital status: Married    Spouse name: Joan Johnson   Number of children: 2   Years of education: Not on file   Highest education level: 12th grade  Occupational History   Not on file  Tobacco Use   Smoking status: Former    Current packs/day: 0.00    Average packs/day: 0.5 packs/day for 21.0 years (10.5 ttl pk-yrs)    Types: Cigarettes    Start date: 03/29/1997    Quit date: 03/29/2018    Years since quitting: 5.1    Passive exposure: Past   Smokeless tobacco: Never  Vaping Use   Vaping status: Never Used  Substance and Sexual Activity   Alcohol use: Yes    Alcohol/week: 2.0 standard drinks of alcohol    Types: 2 Glasses of wine per week    Comment: weekly   Drug use: Never   Sexual activity: Not Currently    Birth control/protection: Post-menopausal  Other Topics Concern   Not on file  Social History Narrative   ** Merged History Encounter **       Social Determinants of Health   Financial Resource Strain: Low Risk  (10/19/2022)   Overall Financial Resource Strain (CARDIA)    Difficulty of Paying Living Expenses: Not hard at all  Food Insecurity: No Food Insecurity (10/19/2022)   Hunger Vital Sign    Worried About Running Out of Food in the Last Year: Never true    Ran Out of Food in the Last Year: Never true  Transportation Needs: No Transportation Needs (10/19/2022)   PRAPARE - Administrator, Civil Service (Medical): No    Lack of Transportation (Non-Medical): No  Physical Activity: Not on file  Stress: No Stress Concern Present (10/19/2022)   [employer] of Occupational Health - Occupational Stress Questionnaire    Feeling of Stress : Not at all  Social Connections: Moderately Isolated (10/19/2022)   Social Connection and Isolation Panel [NHANES]    Frequency of Communication with Friends and Family: More than three times a week    Frequency of Social Gatherings with Friends and Family: More than three times a week    Attends  Religious Services: Never    Database administrator or Organizations: No    Attends Engineer, structural: Not on file    Marital Status: Married  Catering manager Violence: Not on file      Review of Systems  Constitutional:  Positive for unexpected weight change. Negative for chills and fatigue.  HENT:  Negative for congestion, rhinorrhea, sneezing and sore throat.   Eyes:  Negative for redness.  Respiratory:  Negative for cough, chest tightness and shortness of breath.   Cardiovascular:  Negative for chest pain and palpitations.  Gastrointestinal:  Negative for abdominal pain, constipation, diarrhea, nausea and vomiting.  Genitourinary:  Negative for dysuria and frequency.  Musculoskeletal:  Negative for  arthralgias, back pain, joint swelling and neck pain.  Skin:  Negative for rash.  Neurological: Negative.  Negative for tremors and numbness.  Hematological:  Negative for adenopathy. Does not bruise/bleed easily.  Psychiatric/Behavioral:  Negative for behavioral problems (Depression), sleep disturbance and suicidal ideas. The patient is not nervous/anxious.     Vital Signs: BP 130/72   Pulse (!) 107   Temp 98.4 F (36.9 C)   Resp 16   Ht 5\' 3"  (1.6 m)   Wt 190 lb 3.2 oz (86.3 kg)   SpO2 98%   BMI 33.69 kg/m    Physical Exam Vitals reviewed.  Constitutional:      General: She is not in acute distress.    Appearance: Normal appearance. She is obese. She is not ill-appearing.  HENT:     Head: Normocephalic and atraumatic.  Eyes:     Pupils: Pupils are equal, round, and reactive to light.  Cardiovascular:     Rate and Rhythm: Normal rate and regular rhythm.  Pulmonary:     Effort: Pulmonary effort is normal. No respiratory distress.  Neurological:     Mental Status: She is alert and oriented to person, place, and time.  Psychiatric:        Mood and Affect: Mood normal.        Behavior: Behavior normal.       Assessment/Plan: 1. Hyperkalemia Has  been eating a lot of foods high in potassium. Discussed foods in diet to decrease in frequency. Can repeat level in a month.   2. Mixed hyperlipidemia Continue with diet and lifestyle modifications as discussed. Will repeat cholesterol levels in 3-6 months   3. B12 deficiency B12 injection administered today in office  - cyanocobalamin (VITAMIN B12) injection 1,000 mcg  4. Abnormal weight gain Follow up in 8 weeks  - Phendimetrazine Tartrate 105 MG CP24; Take 1 capsule (105 mg total) by mouth daily before breakfast.  Dispense: 30 capsule; Refill: 1 - topiramate (TOPAMAX) 25 MG tablet; TAKE 1 TABLET(25 MG) BY MOUTH TWICE DAILY  Dispense: 60 tablet; Refill: 2  5. Class 2 obesity due to excess calories without serious comorbidity with body mass index (BMI) of 35.0 to 35.9 in adult Continue medications as prescribed, follow up in 8 weeks  - Phendimetrazine Tartrate 105 MG CP24; Take 1 capsule (105 mg total) by mouth daily before breakfast.  Dispense: 30 capsule; Refill: 1 - topiramate (TOPAMAX) 25 MG tablet; TAKE 1 TABLET(25 MG) BY MOUTH TWICE DAILY  Dispense: 60 tablet; Refill: 2   General Counseling: Priscilla verbalizes understanding of the findings of todays visit and agrees with plan of treatment. I have discussed any further diagnostic evaluation that may be needed or ordered today. We also reviewed her medications today. she has been encouraged to call the office with any questions or concerns that should arise related to todays visit.    No orders of the defined types were placed in this encounter.   Meds ordered this encounter  Medications   Phendimetrazine Tartrate 105 MG CP24    Sig: Take 1 capsule (105 mg total) by mouth daily before breakfast.    Dispense:  30 capsule    Refill:  1    Do not run through insurance, patient will have goodrx coupon.   topiramate (TOPAMAX) 25 MG tablet    Sig: TAKE 1 TABLET(25 MG) BY MOUTH TWICE DAILY    Dispense:  60 tablet    Refill:  2     ZERO refills remain  on this prescription. Your patient is requesting advance approval of refills for this medication to PREVENT ANY MISSED DOSES   cyanocobalamin (VITAMIN B12) injection 1,000 mcg    Return in about 8 weeks (around 07/03/2023) for F/U, Weight loss, Takeia Ciaravino PCP.   Total time spent:30 Minutes Time spent includes review of chart, medications, test results, and follow up plan with the patient.   [place] Controlled Substance Database was reviewed by me.  This patient was seen by Sallyanne Kuster, FNP-C in collaboration with Dr. Beverely Risen as a part of collaborative care agreement.   Yarnell Arvidson R. Tedd Sias, MSN, FNP-C Internal medicine

## 2023-05-12 ENCOUNTER — Telehealth: Payer: Self-pay

## 2023-05-12 MED ORDER — VITAMIN D (ERGOCALCIFEROL) 1.25 MG (50000 UNIT) PO CAPS: 50000.0000 [IU] | ORAL_CAPSULE | ORAL | 3 refills | Status: DC

## 2023-05-12 NOTE — Telephone Encounter (Signed)
Patient called for Vitamin D refill.

## 2023-05-20 ENCOUNTER — Encounter: Payer: Self-pay | Admitting: Nurse Practitioner

## 2023-05-27 ENCOUNTER — Encounter: Payer: Self-pay | Admitting: Nurse Practitioner

## 2023-05-30 ENCOUNTER — Other Ambulatory Visit: Payer: Self-pay | Admitting: Internal Medicine

## 2023-06-01 ENCOUNTER — Emergency Department (HOSPITAL_BASED_OUTPATIENT_CLINIC_OR_DEPARTMENT_OTHER)
Admission: EM | Admit: 2023-06-01 | Discharge: 2023-06-01 | Disposition: A | Discharge status: Home / Self Care | Payer: 59 | Attending: Emergency Medicine | Admitting: Emergency Medicine

## 2023-06-01 ENCOUNTER — Encounter (HOSPITAL_BASED_OUTPATIENT_CLINIC_OR_DEPARTMENT_OTHER): Payer: Self-pay

## 2023-06-01 ENCOUNTER — Emergency Department (HOSPITAL_BASED_OUTPATIENT_CLINIC_OR_DEPARTMENT_OTHER): Payer: 59

## 2023-06-01 ENCOUNTER — Other Ambulatory Visit: Payer: Self-pay

## 2023-06-01 DIAGNOSIS — I10 Essential (primary) hypertension: Secondary | ICD-10-CM | POA: Diagnosis not present

## 2023-06-01 DIAGNOSIS — R1032 Left lower quadrant pain: Secondary | ICD-10-CM | POA: Insufficient documentation

## 2023-06-01 DIAGNOSIS — Z79899 Other long term (current) drug therapy: Secondary | ICD-10-CM | POA: Diagnosis not present

## 2023-06-01 DIAGNOSIS — R319 Hematuria, unspecified: Secondary | ICD-10-CM | POA: Insufficient documentation

## 2023-06-01 DIAGNOSIS — R110 Nausea: Secondary | ICD-10-CM | POA: Insufficient documentation

## 2023-06-01 LAB — URINALYSIS, ROUTINE W REFLEX MICROSCOPIC
Bilirubin Urine: NEGATIVE
Glucose, UA: NEGATIVE mg/dL
Ketones, ur: NEGATIVE mg/dL
Leukocytes,Ua: NEGATIVE
Nitrite: NEGATIVE
Protein, ur: 30 mg/dL — AB
Specific Gravity, Urine: 1.015 (ref 1.005–1.030)
pH: 7 (ref 5.0–8.0)

## 2023-06-01 LAB — COMPREHENSIVE METABOLIC PANEL
ALT: 35 U/L (ref 0–44)
AST: 24 U/L (ref 15–41)
Albumin: 4.3 g/dL (ref 3.5–5.0)
Alkaline Phosphatase: 52 U/L (ref 38–126)
Anion gap: 9 (ref 5–15)
BUN: 12 mg/dL (ref 6–20)
CO2: 23 mmol/L (ref 22–32)
Calcium: 9 mg/dL (ref 8.9–10.3)
Chloride: 106 mmol/L (ref 98–111)
Creatinine, Ser: 0.74 mg/dL (ref 0.44–1.00)
GFR, Estimated: >60 mL/min (ref >60)
Glucose, Bld: 93 mg/dL (ref 70–99)
Potassium: 3.2 mmol/L — ABNORMAL LOW (ref 3.5–5.1)
Sodium: 138 mmol/L (ref 135–145)
Total Bilirubin: 1.3 mg/dL — ABNORMAL HIGH (ref 0.3–1.2)
Total Protein: 7 g/dL (ref 6.5–8.1)

## 2023-06-01 LAB — CBC
HCT: 40.3 % (ref 36.0–46.0)
Hemoglobin: 13.5 g/dL (ref 12.0–15.0)
MCH: 30.6 pg (ref 26.0–34.0)
MCHC: 33.5 g/dL (ref 30.0–36.0)
MCV: 91.4 fL (ref 80.0–100.0)
Platelets: 273 10*3/uL (ref 150–400)
RBC: 4.41 MIL/uL (ref 3.87–5.11)
RDW: 12.8 % (ref 11.5–15.5)
WBC: 5.7 10*3/uL (ref 4.0–10.5)
nRBC: 0 % (ref 0.0–0.2)

## 2023-06-01 LAB — URINALYSIS, MICROSCOPIC (REFLEX): RBC / HPF: >50 RBC/hpf (ref 0–5)

## 2023-06-01 MED ORDER — CEPHALEXIN 500 MG PO CAPS: 500.0000 mg | ORAL_CAPSULE | Freq: Four times a day (QID) | ORAL | 0 refills | Status: AC

## 2023-06-01 MED ORDER — KETOROLAC TROMETHAMINE 15 MG/ML IJ SOLN
15.0000 mg | Freq: Once | INTRAMUSCULAR | Status: AC
  Administered 2023-06-01: 15 mg via INTRAVENOUS
  Filled 2023-06-01: qty 1

## 2023-06-01 MED ORDER — POTASSIUM CHLORIDE CRYS ER 20 MEQ PO TBCR
40.0000 meq | EXTENDED_RELEASE_TABLET | Freq: Once | ORAL | Status: AC
  Administered 2023-06-01: 40 meq via ORAL
  Filled 2023-06-01: qty 2

## 2023-06-01 NOTE — ED Notes (Signed)
Bladder scan performed  0ml noted  

## 2023-06-01 NOTE — Discharge Instructions (Signed)
You were seen today for blood in your urine.  This could be due to an early infection or recently passed kidney stone.  You are being started on antibiotics and should complete the entire course.  You should follow-up with your doctor and call your urologist for follow-up.  Your potassium was slightly low, you should have this rechecked by your doctor.  If you develop worsening bleeding, severe pain, fainting, fever you should return to the ED.

## 2023-06-01 NOTE — ED Triage Notes (Addendum)
States has been postmenopausal since 52 years old. Today woke up with blood in urine and pressure with urination with increased urge to urinate. Hx of kidney stones

## 2023-06-01 NOTE — ED Provider Notes (Signed)
[place] [facility] Provider Note   CSN: [id number] Arrival date & time: 06/01/23  0865     History  Chief Complaint  Patient presents with   Hematuria    Joan Johnson is a 52 y.o. female.   Hematuria  52 year old female history of hypertension, prior kidney stones presenting for flank pain and hematuria.  Patient states for last few days she has had pain in her left flank and left lower quadrant as well as hematuria.  Hematuria started this morning.  She has noticed blood in her urine.  No clots.  Some urinary urgency.  No urinary retention.  Some nausea but no vomiting.  No chest pain shortness of breath.  No blood in her stool.  No vaginal bleeding.  She has had multiple prior kidney stones before that felt similar.  Pain is only mild.  Not on any blood thinners.     Home Medications Prior to Admission medications   Medication Sig Start Date End Date Taking? Authorizing Provider  cephALEXin (KEFLEX) 500 MG capsule Take 1 capsule (500 mg total) by mouth 4 (four) times daily for 7 days. 06/01/23 06/08/23 Yes Joan Spates, MD  ALPRAZolam Prudy Feeler) 0.25 MG tablet Take 1 tablet (0.25 mg total) by mouth 2 (two) times daily as needed for anxiety. 04/06/22   Drubel, Lillia Abed, PA-C  benzonatate (TESSALON) 100 MG capsule Take 1 capsule (100 mg total) by mouth 2 (two) times daily as needed for cough. 10/25/22   Alfredia Ferguson, PA-C  chlorpheniramine-HYDROcodone (TUSSIONEX) 10-8 MG/5ML Take 5 mLs by mouth at bedtime as needed for cough. 10/21/22   Alfredia Ferguson, PA-C  EPINEPHrine 0.3 mg/0.3 mL IJ SOAJ injection Inject 0.3 mg into the muscle as needed for anaphylaxis. 04/06/22   Alfredia Ferguson, PA-C  hydroxychloroquine (PLAQUENIL) 200 MG tablet Take 200 mg by mouth daily. 02/19/21   [provider]  hydrOXYzine (ATARAX) 10 MG tablet Take 1-2 tablets (10-20 mg total) by mouth 3 (three) times daily as needed (dizziness). 04/11/23   Sallyanne Kuster, NP  losartan (COZAAR) 50 MG tablet TAKE 1 TABLET(50 MG) BY MOUTH DAILY 12/12/22   Drubel, Lillia Abed, PA-C  mometasone (ELOCON) 0.1 % cream Use twice daily for up to 10 days in a row 10/05/22   Drubel, Lillia Abed, PA-C  Phendimetrazine Tartrate 105 MG CP24 Take 1 capsule (105 mg total) by mouth daily before breakfast. 05/08/23   Sallyanne Kuster, NP  rosuvastatin (CRESTOR) 10 MG tablet Take 1 tablet (10 mg total) by mouth daily. 04/08/22   Drubel, Lillia Abed, PA-C  topiramate (TOPAMAX) 25 MG tablet TAKE 1 TABLET(25 MG) BY MOUTH TWICE DAILY 05/08/23   Sallyanne Kuster, NP  Vitamin D, Ergocalciferol, (DRISDOL) 1.25 MG (50000 UNIT) CAPS capsule Take 1 capsule (50,000 Units total) by mouth once a week. 05/12/23   Sallyanne Kuster, NP      Allergies    Codeine, Ivp dye [iodinated contrast media], Shellfish allergy, and Shellfish-derived products    Review of Systems   Review of Systems  Genitourinary:  Positive for hematuria.  Review of systems completed and notable as per HPI.  ROS otherwise negative.   Physical Exam Updated Vital Signs BP 117/83 (BP Location: Right Arm)   Pulse 85   Temp 97.7 F (36.5 C)   Resp 17   Ht 5\' 3"  (1.6 m)   Wt 83.9 kg   SpO2 99%   BMI 32.77 kg/m  Physical Exam Vitals and nursing note reviewed.  Constitutional:  General: She is not in acute distress.    Appearance: She is well-developed.  HENT:     Head: Normocephalic and atraumatic.  Eyes:     Conjunctiva/sclera: Conjunctivae normal.  Cardiovascular:     Rate and Rhythm: Normal rate and regular rhythm.     Heart sounds: No murmur heard. Pulmonary:     Effort: Pulmonary effort is normal. No respiratory distress.     Breath sounds: Normal breath sounds.  Abdominal:     Palpations: Abdomen is soft.     Tenderness: There is abdominal tenderness. There is no guarding or rebound.     Comments: Mild tenderness to left flank and left lower quadrant  Musculoskeletal:        General: No swelling.      Cervical back: Neck supple.     Right lower leg: No edema.     Left lower leg: No edema.  Skin:    General: Skin is warm and dry.     Capillary Refill: Capillary refill takes less than 2 seconds.  Neurological:     Mental Status: She is alert.  Psychiatric:        Mood and Affect: Mood normal.     ED Results / Procedures / Treatments   Labs (all labs ordered are listed, but only abnormal results are displayed) Labs Reviewed  URINALYSIS, ROUTINE W REFLEX MICROSCOPIC - Abnormal; Notable for the following components:      Result Value   Color, Urine AMBER (*)    Hgb urine dipstick LARGE (*)    Protein, ur 30 (*)    All other components within normal limits  COMPREHENSIVE METABOLIC PANEL - Abnormal; Notable for the following components:   Potassium 3.2 (*)    Total Bilirubin 1.3 (*)    All other components within normal limits  URINALYSIS, MICROSCOPIC (REFLEX) - Abnormal; Notable for the following components:   Bacteria, UA RARE (*)    All other components within normal limits  CBC    EKG None  Radiology CT ABDOMEN PELVIS WO CONTRAST  Result Date: 06/01/2023 CLINICAL DATA:  Blood in urine.  Pain EXAM: CT ABDOMEN AND PELVIS WITHOUT CONTRAST TECHNIQUE: Multidetector CT imaging of the abdomen and pelvis was performed following the standard protocol without IV contrast. RADIATION DOSE REDUCTION: This exam was performed according to the departmental dose-optimization program which includes automated exposure control, adjustment of the mA and/or kV according to patient size and/or use of iterative reconstruction technique. COMPARISON:  Renal stone CT 04/17/2021. FINDINGS: Lower chest: Breast implants are seen at the edge of the imaging field. There is some linear opacity at the bases likely scar or atelectasis. Hepatobiliary: There is a tiny low-attenuation lesion peripherally in the left hepatic lobe on series 301, image 13, unchanged from previous and too small to completely  characterize but likely a benign cystic lesion and no specific imaging follow-up. Gallbladder is nondilated. Pancreas: Unremarkable. No pancreatic ductal dilatation or surrounding inflammatory changes. Spleen: Normal in size without focal abnormality. Adrenals/Urinary Tract: Adrenal glands are preserved. No abnormal calcification seen in the right kidney nor along the course of the right ureter. Tiny low-attenuation lesion along the posterior right mid kidney measuring 3 mm. Too small to characterize but appearance may actually be fat and tiny angiomyolipoma or tiny benign cysts. No specific imaging follow-up. No left ureteral stone. No left-sided renal collecting system dilatation. There is cluster of stones along the upper pole of the left kidney largest measures 7 mm. Punctate stone in  the lower pole of the left kidney. Contracted urinary bladder. Stomach/Bowel: Moderate diffuse colonic stool. The large bowel is nondilated. Stomach and small bowel are nondilated. Vascular/Lymphatic: Aortic atherosclerosis. No enlarged abdominal or pelvic lymph nodes. Reproductive: Uterus and bilateral adnexa are unremarkable. Other: No free air or free fluid. Musculoskeletal: Mild degenerative changes of the spine. Curvature of the spine. IMPRESSION: Left-sided nonobstructing renal stones.  No ureteral stones. No bowel obstruction.  Moderate colonic stool. Electronically Signed   By: Karen Kays M.D.   On: 06/01/2023 13:34    Procedures Procedures    Medications Ordered in ED Medications  potassium chloride SA (KLOR-CON M) CR tablet 40 mEq (40 mEq Oral Given 06/01/23 1006)  ketorolac (TORADOL) 15 MG/ML injection 15 mg (15 mg Intravenous Given 06/01/23 1246)    ED Course/ Medical Decision Making/ A&P Clinical Course as of 06/01/23 1353  Thu Jun 01, 2023  0905 Post void residual zero [JD]    Clinical Course User Index [JD] Joan Spates, MD                                 Medical Decision Making Amount  and/or Complexity of Data Reviewed Labs: ordered. Radiology: ordered.  Risk Prescription drug management.   Medical Decision Making:   Anslie Wecker is a 52 y.o. female who presented to the ED today with hematuria.  Vital signs reviewed.  Exam she is well-appearing.  Only mild pain.  She is mildly tender left lower quadrant and left flank.  Differential occluding nephrolithiasis, infection, bladder or renal mass.  Obtain CT scan, lab workup.  She is not anticoagulated.  No systemic signs of infection.   Patient placed on continuous vitals and telemetry monitoring while in ED which was reviewed periodically.  Reviewed and confirmed nursing documentation for past medical history, family history, social history.  Reassessment and Plan:   Reassessment she feels well.  Urine is clearing up.  Postvoid residual 0, no signs of retention.  Pain is resolved.  Labs notable for potassium 3.2 which is repleted.  CT reviewed, she has nephrolithiasis but no ureteral stones, signs of mass.  Otherwise unremarkable.  Wonder if she may have passed a stone.  Urinalysis shows hematuria with rare bacteria.  No white blood cells, she is concerned she could have a UTI given she has had some dysuria.  I discussed with her that it is possible she did pass a stone or could have an early infection.  She would like to try antibiotics to see if this helps clear up her symptoms.  I think is reasonable to add on a culture and start her on some empiric antibiotics in case this is a very early infection causing bleeding.  I recommend she follow close with a urologist that she may need cystoscopy if bleeding is persistent.  I gave her strict return precautions for worsening bleeding, dizziness, fainting, difficulty urinating or emptying her bladder, fever.  Recommend PCP follow-up as well.  She is comfortable this plan.  Discharged in stable condition.   Patient's presentation is most consistent with acute complicated illness /  injury requiring diagnostic workup.           Final Clinical Impression(s) / ED Diagnoses Final diagnoses:  Hematuria, unspecified type    Rx / DC Orders ED Discharge Orders          Ordered    cephALEXin (KEFLEX) 500 MG capsule  4  times daily        06/01/23 1353              Joan Spates, MD 06/01/23 1353

## 2023-06-02 LAB — URINE CULTURE: Culture: NO GROWTH

## 2023-06-05 ENCOUNTER — Ambulatory Visit
Admission: RE | Admit: 2023-06-05 | Discharge: 2023-06-05 | Disposition: A | Discharge status: Home / Self Care | Payer: 59 | Source: Ambulatory Visit | Attending: Urology | Admitting: Urology

## 2023-06-05 ENCOUNTER — Ambulatory Visit
Admission: RE | Admit: 2023-06-05 | Discharge: 2023-06-05 | Disposition: A | Discharge status: Home / Self Care | Payer: 59 | Attending: Urology | Admitting: Urology

## 2023-06-05 ENCOUNTER — Other Ambulatory Visit: Payer: Self-pay

## 2023-06-05 DIAGNOSIS — N200 Calculus of kidney: Secondary | ICD-10-CM

## 2023-06-05 NOTE — Progress Notes (Unsigned)
06/06/2023 9:35 AM   Joan Johnson Dec 08, 1970 [id number]  Referring provider: Sallyanne Kuster, NP [address] [place],  [place] [address]  Urological history: 1. Nephrolithiasis -left URS (07/2021)  -KUB (09/2022) no stones visualized   Chief Complaint  Patient presents with   Nephrolithiasis   HPI: Joan Johnson is a 52 y.o. female who presents today for stones.   Previous records reviewed.   I last saw her in February for a 1 year follow-up.  At that follow-up, she wanted to pursue a 24-hour metabolic workup however this was not completed for some reason.  She was seen in the emergency department on June 01, 2023 for gross hematuria and pressure with urination and increased urge to urinate.  She told the ER physician that she has been having pain in her left flank and left lower quadrant and gross hematuria for the last few days.  She was afebrile and vital signs were normal in the ED.  Noncontrast CT was performed which noted a cluster of stones in the upper pole of the left kidney.  No hydronephrosis was noted.  Her urine was amber color with greater than 50 RBCs, CBC was normal and serum creatinine was 0.74.  Urine culture was negative.  Today, she explained to me that she had a lot of suprapubic pressure and urgency for a couple of days last week and then she started having some left-sided flank pain associated with gross hematuria.  Today her symptoms are not present.  She has not had blood or pain for the last several days.  She states the emergency room doctor felt that she most likely passed a stone and this is where her symptoms originated.  She is not comfortable with this decision because she does not feel that her symptoms were similar to previous stones that she passed.  She also did not see a stone.  She would like more testing done to see if something else caused the blood in her urine.  KUB left renal stones  PMH: Past Medical History:  Diagnosis  Date   Allergy    Anxiety    Arthritis    History of kidney stones    Hypertension    Migraine    Osteopenia 06/2021   DEXA at [facility]; spine and hip    Surgical History: Past Surgical History:  Procedure Laterality Date   ANKLE SURGERY     AUGMENTATION MAMMAPLASTY     Breast Implant   CESAREAN SECTION     1991, 1989, 1987   COLONOSCOPY WITH PROPOFOL N/A 10/01/2020   Procedure: COLONOSCOPY WITH PROPOFOL;  Surgeon: Toney Reil, MD;  Location: [facility] ENDOSCOPY;  Service: Gastroenterology;  Laterality: N/A;   COSMETIC SURGERY     CYSTOSCOPY W/ RETROGRADES Left 07/19/2021   Procedure: CYSTOSCOPY WITH RETROGRADE PYELOGRAM;  Surgeon: Vanna Scotland, MD;  Location: [facility] ORS;  Service: Urology;  Laterality: Left;   CYSTOSCOPY/URETEROSCOPY/HOLMIUM LASER/STENT PLACEMENT Left 07/19/2021   Procedure: CYSTOSCOPY/URETEROSCOPY/HOLMIUM LASER/STENT PLACEMENT;  Surgeon: Vanna Scotland, MD;  Location: [facility] ORS;  Service: Urology;  Laterality: Left;   dental implants     ESOPHAGOGASTRODUODENOSCOPY (EGD) WITH PROPOFOL N/A 10/01/2020   Procedure: ESOPHAGOGASTRODUODENOSCOPY (EGD) WITH PROPOFOL;  Surgeon: Toney Reil, MD;  Location[facility] ENDOSCOPY;  Service: Gastroenterology;  Laterality: N/A;   HERNIA REPAIR     OTHER SURGICAL HISTORY     tummy tuck   OTHER SURGICAL HISTORY     carpal tunnel   TUBAL LIGATION  Home Medications:  Allergies as of 06/06/2023       Reactions   Codeine Itching, Other (See Comments)   Ivp Dye [iodinated Contrast Media] Hives   Shellfish Allergy Hives   Shellfish-derived Products Other (See Comments)        Medication List        Accurate as of June 06, 2023  9:35 AM. If you have any questions, ask your nurse or doctor.          ALPRAZolam 0.25 MG tablet Commonly known as: XANAX Take 1 tablet (0.25 mg total) by mouth 2 (two) times daily as needed for anxiety.   benzonatate 100 MG capsule Commonly known as: TESSALON Take 1 capsule  (100 mg total) by mouth 2 (two) times daily as needed for cough.   cephALEXin 500 MG capsule Commonly known as: KEFLEX Take 1 capsule (500 mg total) by mouth 4 (four) times daily for 7 days.   chlorpheniramine-HYDROcodone 10-8 MG/5ML Commonly known as: TUSSIONEX Take 5 mLs by mouth at bedtime as needed for cough.   EPINEPHrine 0.3 mg/0.3 mL Soaj injection Commonly known as: EPI-PEN Inject 0.3 mg into the muscle as needed for anaphylaxis.   hydroxychloroquine 200 MG tablet Commonly known as: PLAQUENIL Take 200 mg by mouth daily.   hydrOXYzine 10 MG tablet Commonly known as: ATARAX Take 1-2 tablets (10-20 mg total) by mouth 3 (three) times daily as needed (dizziness).   losartan 50 MG tablet Commonly known as: COZAAR TAKE 1 TABLET(50 MG) BY MOUTH DAILY   mometasone 0.1 % cream Commonly known as: ELOCON Use twice daily for up to 10 days in a row   Phendimetrazine Tartrate 105 MG Cp24 Take 1 capsule (105 mg total) by mouth daily before breakfast.   rosuvastatin 10 MG tablet Commonly known as: Crestor Take 1 tablet (10 mg total) by mouth daily.   topiramate 25 MG tablet Commonly known as: TOPAMAX TAKE 1 TABLET(25 MG) BY MOUTH TWICE DAILY   Vitamin D (Ergocalciferol) 1.25 MG (50000 UNIT) Caps capsule Commonly known as: DRISDOL Take 1 capsule (50,000 Units total) by mouth once a week.        Allergies:  Allergies  Allergen Reactions   Codeine Itching and Other (See Comments)   Ivp Dye [Iodinated Contrast Media] Hives   Shellfish Allergy Hives   Shellfish-Derived Products Other (See Comments)    Family History: Family History  Problem Relation Age of Onset   Heart murmur Mother    Thyroid disease Daughter    Breast cancer Neg Hx    Ovarian cancer Neg Hx     Social History:  reports that she quit smoking about 5 years ago. Her smoking use included cigarettes. She started smoking about 26 years ago. She has a 10.5 pack-year smoking history. She has been  exposed to tobacco smoke. She has never used smokeless tobacco. She reports current alcohol use of about 2.0 standard drinks of alcohol per week. She reports that she does not use drugs.  ROS: Pertinent ROS in HPI  Physical Exam: BP 113/75   Pulse 70   Ht 5\' 3"  (1.6 m)   Wt 190 lb (86.2 kg)   BMI 33.66 kg/m   Constitutional:  Well nourished. Alert and oriented, No acute distress. HEENT: [place] AT, moist mucus membranes.  Trachea midline Cardiovascular: No clubbing, cyanosis, or edema. Respiratory: Normal respiratory effort, no increased work of breathing. Neurologic: Grossly intact, no focal deficits, moving all 4 extremities. Psychiatric: Normal mood and affect.  Laboratory Data: Lab Results  Component Value Date   WBC 5.7 06/01/2023   HGB 13.5 06/01/2023   HCT 40.3 06/01/2023   MCV 91.4 06/01/2023   PLT 273 06/01/2023    Lab Results  Component Value Date   CREATININE 0.74 06/01/2023    Lab Results  Component Value Date   HGBA1C 5.3 04/07/2022    Lab Results  Component Value Date   TSH 1.790 04/07/2022       Component Value Date/Time   CHOL 219 (H) 04/25/2023 1101   HDL 51 04/25/2023 1101   CHOLHDL 4.3 04/25/2023 1101   LDLCALC 152 (H) 04/25/2023 1101    Lab Results  Component Value Date   AST 24 06/01/2023   Lab Results  Component Value Date   ALT 35 06/01/2023  I have reviewed the labs.   Pertinent Imaging: CLINICAL DATA:  Blood in urine.  Pain   EXAM: CT ABDOMEN AND PELVIS WITHOUT CONTRAST   TECHNIQUE: Multidetector CT imaging of the abdomen and pelvis was performed following the standard protocol without IV contrast.   RADIATION DOSE REDUCTION: This exam was performed according to the departmental dose-optimization program which includes automated exposure control, adjustment of the mA and/or kV according to patient size and/or use of iterative reconstruction technique.   COMPARISON:  Renal stone CT 04/17/2021.   FINDINGS: Lower  chest: Breast implants are seen at the edge of the imaging field. There is some linear opacity at the bases likely scar or atelectasis.   Hepatobiliary: There is a tiny low-attenuation lesion peripherally in the left hepatic lobe on series 301, image 13, unchanged from previous and too small to completely characterize but likely a benign cystic lesion and no specific imaging follow-up. Gallbladder is nondilated.   Pancreas: Unremarkable. No pancreatic ductal dilatation or surrounding inflammatory changes.   Spleen: Normal in size without focal abnormality.   Adrenals/Urinary Tract: Adrenal glands are preserved. No abnormal calcification seen in the right kidney nor along the course of the right ureter. Tiny low-attenuation lesion along the posterior right mid kidney measuring 3 mm. Too small to characterize but appearance may actually be fat and tiny angiomyolipoma or tiny benign cysts. No specific imaging follow-up. No left ureteral stone. No left-sided renal collecting system dilatation. There is cluster of stones along the upper pole of the left kidney largest measures 7 mm. Punctate stone in the lower pole of the left kidney. Contracted urinary bladder.   Stomach/Bowel: Moderate diffuse colonic stool. The large bowel is nondilated. Stomach and small bowel are nondilated.   Vascular/Lymphatic: Aortic atherosclerosis. No enlarged abdominal or pelvic lymph nodes.   Reproductive: Uterus and bilateral adnexa are unremarkable.   Other: No free air or free fluid.   Musculoskeletal: Mild degenerative changes of the spine. Curvature of the spine.   IMPRESSION: Left-sided nonobstructing renal stones.  No ureteral stones.   No bowel obstruction.  Moderate colonic stool.     Electronically Signed   By: Karen Kays M.D.   On: 06/01/2023 13:34 I have independently reviewed the films.   See HPI.  Assessment & Plan:    1. Nephrolithiasis -KUB Left renal stone  2.  Left  flank pain -resolved  3. Gross hematuria   -Advised her that she can stop her antibiotic because her urine culture was negative -I explained to her that I felt that her clinical scenario did sound like spontaneous passage of a stone and at this time we could recheck her urine and KUB in the  next few weeks to see if the ureteral fragment presents itself or she has persistent blood in her urine or she experiences further gross hematuria before her next appointment or we can schedule cystoscopy for more definitive evaluation of her bladder as she does have a smoking history which puts her at risk for bladder cancer -She has chosen the latter and would like to undergo cystoscopic evaluation -Explained that the cystoscopy is a safe and common diagnostic test performed by one of our physicians  in the office.  It consist of using a thin, lighted tube to look directly inside the bladder and urethra to evaluate the anatomy.  The procedure is brief, typically taking about 5 minutes. -This will enable [place] to assess bladder health, diagnose and enlarged prostate and rule out other bladder conditions (stricture disease, stones, cancer, etc.)  -Advised the patient that there are no restrictions to eating or drinking prior to the cystoscopy -They can continue to take all of their medications as prescribed -They can drive themselves to and from the appointment -I explained that during the procedure, the area around the urethra will be cleansed thoroughly, topical anesthetic will be applied to numb your urethra, the thin tube is then gently inserted through the urethra into your bladder while fluid flows through the tube to the bladder to enable better visualization -I explained the procedure is usually not painful, however there may be some discomfort (pinching feeling), and they may feel an urge to urinate, coolness or fullness in the bladder and then the cystoscope is removed -After the cystoscopy, I advised them  that they may experience urinary frequency, hematuria, dysuria which will resolve within 24 to 48 hours -Reviewed red flag signs (fever, bright red blood or blood clots in the urine, abdominal pain or difficulty urinating) and to contact the office immediately or seek treatment in the ED if they should experience any of these -The physician will discuss the results of the cystoscopy at the time of the procedure     Return for Return for cysto for gross heme .  These notes generated with voice recognition software. I apologize for typographical errors.  Cloretta Ned  [facility] [address]  [address] [place], [place] [address] [phone]

## 2023-06-06 ENCOUNTER — Encounter: Payer: Self-pay | Admitting: Urology

## 2023-06-06 ENCOUNTER — Ambulatory Visit: Payer: 59 | Admitting: Urology

## 2023-06-06 VITALS — BP 113/75 | HR 70 | Ht 63.0 in | Wt 190.0 lb

## 2023-06-06 DIAGNOSIS — R310 Gross hematuria: Secondary | ICD-10-CM | POA: Diagnosis not present

## 2023-06-06 DIAGNOSIS — Z87442 Personal history of urinary calculi: Secondary | ICD-10-CM | POA: Diagnosis not present

## 2023-06-06 DIAGNOSIS — R109 Unspecified abdominal pain: Secondary | ICD-10-CM

## 2023-06-06 DIAGNOSIS — N200 Calculus of kidney: Secondary | ICD-10-CM

## 2023-06-24 ENCOUNTER — Encounter: Payer: Self-pay | Admitting: Nurse Practitioner

## 2023-07-03 ENCOUNTER — Encounter: Payer: Self-pay | Admitting: Nurse Practitioner

## 2023-07-03 ENCOUNTER — Ambulatory Visit: Payer: Self-pay | Admitting: Nurse Practitioner

## 2023-07-03 VITALS — BP 120/86 | HR 85 | Temp 98.4°F | Resp 16 | Ht 63.0 in | Wt 185.0 lb

## 2023-07-03 DIAGNOSIS — E6609 Other obesity due to excess calories: Secondary | ICD-10-CM

## 2023-07-03 DIAGNOSIS — E66812 Obesity, class 2: Secondary | ICD-10-CM

## 2023-07-03 DIAGNOSIS — E876 Hypokalemia: Secondary | ICD-10-CM | POA: Diagnosis not present

## 2023-07-03 DIAGNOSIS — Z6835 Body mass index (BMI) 35.0-35.9, adult: Secondary | ICD-10-CM | POA: Diagnosis not present

## 2023-07-03 DIAGNOSIS — E538 Deficiency of other specified B group vitamins: Secondary | ICD-10-CM | POA: Diagnosis not present

## 2023-07-03 MED ORDER — PHENDIMETRAZINE TARTRATE ER 105 MG PO CP24: 105.0000 mg | ORAL_CAPSULE | Freq: Every day | ORAL | 1 refills | Status: DC

## 2023-07-03 MED ORDER — CYANOCOBALAMIN 1000 MCG/ML IJ SOLN
1000.0000 ug | Freq: Once | INTRAMUSCULAR | Status: AC
  Administered 2023-07-03: 1000 ug via INTRAMUSCULAR

## 2023-07-03 NOTE — Progress Notes (Signed)
[facility] [address] [place], [place] [address]  Internal MEDICINE  Office Visit Note  Patient Name: Joan Johnson  [id number]  [id number]  Date of Service: 07/03/2023  Chief Complaint  Patient presents with   Follow-up    Weight loss     HPI Joan Johnson presents for a follow-up visit for kidney stones, low potassium, weight loss and B12 deficiency.  Kidney stones -- had hematuria from this, sees urology.  Hypokalemia -- last potassium level was low.  B12 deficiency -- request B12 injection today.  Weight loss -- lost 5 lbs since last visit.     Current Medication: Outpatient Encounter Medications as of 07/03/2023  Medication Sig   ALPRAZolam (XANAX) 0.25 MG tablet Take 1 tablet (0.25 mg total) by mouth 2 (two) times daily as needed for anxiety.   benzonatate (TESSALON) 100 MG capsule Take 1 capsule (100 mg total) by mouth 2 (two) times daily as needed for cough.   chlorpheniramine-HYDROcodone (TUSSIONEX) 10-8 MG/5ML Take 5 mLs by mouth at bedtime as needed for cough.   EPINEPHrine 0.3 mg/0.3 mL IJ SOAJ injection Inject 0.3 mg into the muscle as needed for anaphylaxis.   hydroxychloroquine (PLAQUENIL) 200 MG tablet Take 200 mg by mouth daily.   hydrOXYzine (ATARAX) 10 MG tablet Take 1-2 tablets (10-20 mg total) by mouth 3 (three) times daily as needed (dizziness).   losartan (COZAAR) 50 MG tablet TAKE 1 TABLET(50 MG) BY MOUTH DAILY   mometasone (ELOCON) 0.1 % cream Use twice daily for up to 10 days in a row   rosuvastatin (CRESTOR) 10 MG tablet Take 1 tablet (10 mg total) by mouth daily.   topiramate (TOPAMAX) 25 MG tablet TAKE 1 TABLET(25 MG) BY MOUTH TWICE DAILY   Vitamin D, Ergocalciferol, (DRISDOL) 1.25 MG (50000 UNIT) CAPS capsule Take 1 capsule (50,000 Units total) by mouth once a week.   [DISCONTINUED] Phendimetrazine Tartrate 105 MG CP24 Take 1 capsule (105 mg total) by mouth daily before breakfast.   Phendimetrazine Tartrate 105 MG CP24 Take 1 capsule  (105 mg total) by mouth daily before breakfast.   Facility-Administered Encounter Medications as of 07/03/2023  Medication   cyanocobalamin (VITAMIN B12) injection 1,000 mcg   cyanocobalamin (VITAMIN B12) injection 1,000 mcg   [COMPLETED] cyanocobalamin (VITAMIN B12) injection 1,000 mcg    Surgical History: Past Surgical History:  Procedure Laterality Date   ANKLE SURGERY     AUGMENTATION MAMMAPLASTY     Breast Implant   CESAREAN SECTION     1991, 1989, 1987   COLONOSCOPY WITH PROPOFOL N/A 10/01/2020   Procedure: COLONOSCOPY WITH PROPOFOL;  Surgeon: Toney Reil, MD;  Location: [facility] ENDOSCOPY;  Service: Gastroenterology;  Laterality: N/A;   COSMETIC SURGERY     CYSTOSCOPY W/ RETROGRADES Left 07/19/2021   Procedure: CYSTOSCOPY WITH RETROGRADE PYELOGRAM;  Surgeon: Vanna Scotland, MD;  Location: [facility] ORS;  Service: Urology;  Laterality: Left;   CYSTOSCOPY/URETEROSCOPY/HOLMIUM LASER/STENT PLACEMENT Left 07/19/2021   Procedure: CYSTOSCOPY/URETEROSCOPY/HOLMIUM LASER/STENT PLACEMENT;  Surgeon: Vanna Scotland, MD;  Location: [facility] ORS;  Service: Urology;  Laterality: Left;   dental implants     ESOPHAGOGASTRODUODENOSCOPY (EGD) WITH PROPOFOL N/A 10/01/2020   Procedure: ESOPHAGOGASTRODUODENOSCOPY (EGD) WITH PROPOFOL;  Surgeon: Toney Reil, MD;  Location: [facility] ENDOSCOPY;  Service: Gastroenterology;  Laterality: N/A;   HERNIA REPAIR     OTHER SURGICAL HISTORY     tummy tuck   OTHER SURGICAL HISTORY     carpal tunnel   TUBAL LIGATION      Medical  History: Past Medical History:  Diagnosis Date   Allergy    Anxiety    Arthritis    History of kidney stones    Hypertension    Migraine    Osteopenia 06/2021   DEXA at [facility]; spine and hip    Family History: Family History  Problem Relation Age of Onset   Heart murmur Mother    Thyroid disease Daughter    Breast cancer Neg Hx    Ovarian cancer Neg Hx     Social History   Socioeconomic History   Marital status:  Married    Spouse name: Luis   Number of children: 2   Years of education: Not on file   Highest education level: 12th grade  Occupational History   Not on file  Tobacco Use   Smoking status: Former    Current packs/day: 0.00    Average packs/day: 0.5 packs/day for 21.0 years (10.5 ttl pk-yrs)    Types: Cigarettes    Start date: 03/29/1997    Quit date: 03/29/2018    Years since quitting: 5.2    Passive exposure: Past   Smokeless tobacco: Never  Vaping Use   Vaping status: Never Used  Substance and Sexual Activity   Alcohol use: Yes    Alcohol/week: 2.0 standard drinks of alcohol    Types: 2 Glasses of wine per week    Comment: weekly   Drug use: Never   Sexual activity: Not Currently    Birth control/protection: Post-menopausal  Other Topics Concern   Not on file  Social History Narrative   ** Merged History Encounter **       Social Determinants of Health   Financial Resource Strain: Low Risk  (10/19/2022)   Overall Financial Resource Strain (CARDIA)    Difficulty of Paying Living Expenses: Not hard at all  Food Insecurity: No Food Insecurity (10/19/2022)   Hunger Vital Sign    Worried About Running Out of Food in the Last Year: Never true    Ran Out of Food in the Last Year: Never true  Transportation Needs: No Transportation Needs (10/19/2022)   PRAPARE - Administrator, Civil Service (Medical): No    Lack of Transportation (Non-Medical): No  Physical Activity: Not on file  Stress: No Stress Concern Present (10/19/2022)   [employer] of Occupational Health - Occupational Stress Questionnaire    Feeling of Stress : Not at all  Social Connections: Moderately Isolated (10/19/2022)   Social Connection and Isolation Panel [NHANES]    Frequency of Communication with Friends and Family: More than three times a week    Frequency of Social Gatherings with Friends and Family: More than three times a week    Attends Religious Services: Never    Loss adjuster, chartered or Organizations: No    Attends Engineer, structural: Not on file    Marital Status: Married  Catering manager Violence: Not on file      Review of Systems  Constitutional:  Positive for unexpected weight change. Negative for chills and fatigue.  HENT:  Negative for congestion, rhinorrhea, sneezing and sore throat.   Eyes:  Negative for redness.  Respiratory:  Negative for cough, chest tightness and shortness of breath.   Cardiovascular:  Negative for chest pain and palpitations.  Gastrointestinal:  Negative for abdominal pain, constipation, diarrhea, nausea and vomiting.  Genitourinary:  Negative for dysuria and frequency.  Musculoskeletal:  Negative for arthralgias, back pain, joint swelling and neck pain.  Skin:  Negative for rash.  Neurological: Negative.  Negative for tremors and numbness.  Hematological:  Negative for adenopathy. Does not bruise/bleed easily.  Psychiatric/Behavioral:  Negative for behavioral problems (Depression), sleep disturbance and suicidal ideas. The patient is not nervous/anxious.     Vital Signs: BP 120/86   Pulse 85   Temp 98.4 F (36.9 C)   Resp 16   Ht 5\' 3"  (1.6 m)   Wt 185 lb (83.9 kg)   SpO2 99%   BMI 32.77 kg/m    Physical Exam Vitals reviewed.  Constitutional:      General: She is not in acute distress.    Appearance: Normal appearance. She is obese. She is not ill-appearing.  HENT:     Head: Normocephalic and atraumatic.  Eyes:     Pupils: Pupils are equal, round, and reactive to light.  Cardiovascular:     Rate and Rhythm: Normal rate and regular rhythm.  Pulmonary:     Effort: Pulmonary effort is normal. No respiratory distress.  Neurological:     Mental Status: She is alert and oriented to person, place, and time.  Psychiatric:        Mood and Affect: Mood normal.        Behavior: Behavior normal.        Assessment/Plan: 1. Hypokalemia Repeat potassium level - Potassium  2. B12 deficiency B12  injection administered in office today  - cyanocobalamin (VITAMIN B12) injection 1,000 mcg  3. Class 2 obesity due to excess calories without serious comorbidity with body mass index (BMI) of 35.0 to 35.9 in adult Continue diet and lifestyle modifications as discussed and continue phendimetrazine as prescribed. Follow up in 8 weeks for weigh in.  - Phendimetrazine Tartrate 105 MG CP24; Take 1 capsule (105 mg total) by mouth daily before breakfast.  Dispense: 30 capsule; Refill: 1   General Counseling: Joan Johnson verbalizes understanding of the findings of todays visit and agrees with plan of treatment. I have discussed any further diagnostic evaluation that may be needed or ordered today. We also reviewed her medications today. she has been encouraged to call the office with any questions or concerns that should arise related to todays visit.    Orders Placed This Encounter  Procedures   Potassium    Meds ordered this encounter  Medications   Phendimetrazine Tartrate 105 MG CP24    Sig: Take 1 capsule (105 mg total) by mouth daily before breakfast.    Dispense:  30 capsule    Refill:  1    Do not run through insurance, patient will have goodrx coupon.   cyanocobalamin (VITAMIN B12) injection 1,000 mcg    Return in about 8 weeks (around 08/28/2023) for F/U, Weight loss, Oliver Heitzenrater PCP.   Total time spent:30 Minutes Time spent includes review of chart, medications, test results, and follow up plan with the patient.   [place] Controlled Substance Database was reviewed by me.  This patient was seen by Sallyanne Kuster, FNP-C in collaboration with Dr. Beverely Risen as a part of collaborative care agreement.   Donasia Wimes R. Tedd Sias, MSN, FNP-C Internal medicine

## 2023-07-12 ENCOUNTER — Ambulatory Visit: Payer: 59 | Admitting: Urology

## 2023-07-12 VITALS — BP 126/81 | HR 93 | Ht 63.0 in | Wt 185.2 lb

## 2023-07-12 DIAGNOSIS — N200 Calculus of kidney: Secondary | ICD-10-CM

## 2023-07-12 DIAGNOSIS — R310 Gross hematuria: Secondary | ICD-10-CM

## 2023-07-12 LAB — URINALYSIS, COMPLETE
Bilirubin, UA: NEGATIVE
Glucose, UA: NEGATIVE
Ketones, UA: NEGATIVE
Leukocytes,UA: NEGATIVE
Nitrite, UA: NEGATIVE
Protein,UA: NEGATIVE
Specific Gravity, UA: 1.02 (ref 1.005–1.030)
Urobilinogen, Ur: 0.2 mg/dL (ref 0.2–1.0)
pH, UA: 6 (ref 5.0–7.5)

## 2023-07-12 LAB — MICROSCOPIC EXAMINATION: RBC, Urine: >30 /[HPF] — AB (ref 0–2)

## 2023-07-12 NOTE — Progress Notes (Unsigned)
   07/12/23  CC:  Chief Complaint  Patient presents with   Cysto    HPI: 52 year old female with a personal history of left nephrolithiasis and microscopic hematuria who presents today for cystoscopic evaluation.  Most recent CT scan of the abdomen pelvis on 06/01/2023 shows a cluster of primarily left upper pole stones.  NED. A&Ox3.   No respiratory distress   Abd soft, NT, ND Normal external genitalia with patent urethral meatus  Cystoscopy Procedure Note  Patient identification was confirmed, informed consent was obtained, and patient was prepped using Betadine solution.  Lidocaine jelly was administered per urethral meatus.    Procedure: - Flexible cystoscope introduced, without any difficulty.   - Thorough search of the bladder revealed:    normal urethral meatus    normal urothelium    no stones    no ulcers     no tumors    no urethral polyps    no trabeculation  - Ureteral orifices were normal in position and appearance.  Post-Procedure: - Patient tolerated the procedure well  Assessment/ Plan:  1. Gross/microscopic hematuria (Primary) Cystoscopy today is unremarkable, reassuring - Urinalysis, Complete  2. Left nephrolithiasis She does have a fairly sizable left upper pole significant stone burden intermittently symptomatic.  There are multiple smaller stones in the upper pole and suspect she passes these intervally.  We discussed the option of returning back to the operating room for right ureteroscopy, laser lithotripsy and stent placement.  We discussed the risk of the procedure including risk of injury to the ureter, damage surrounding structures, urinary tract infection, stent pain, need for multiple procedures, amongst others.  All questions were answered.  She has had a procedure before and would like to proceed as planned.  Would encourage 24-hour urine metabolic analysis down the road, has been offered but deferred this in the past.  This will  facilitate stone prevention.  She is on board with this today.    Vanna Scotland, MD

## 2023-07-13 ENCOUNTER — Other Ambulatory Visit: Payer: Self-pay

## 2023-07-13 DIAGNOSIS — N200 Calculus of kidney: Secondary | ICD-10-CM

## 2023-07-13 NOTE — Progress Notes (Signed)
Surgical Physician Order Form [facility]  Dr. Vanna Scotland, MD  * Scheduling expectation : Next Available  *Length of Case:   *Clearance needed: no  *Anticoagulation Instructions: Hold all anticoagulants  *Aspirin Instructions: Ok to continue Aspirin  *Post-op visit Date/Instructions:  1 month with RUS prior  *Diagnosis: Left Nephrolithiasis  *Procedure: left Ureteroscopy w/laser lithotripsy & stent placement (09323)   Additional orders: N/A  -Admit type: OUTpatient  -Anesthesia: General  -VTE Prophylaxis Standing Order SCD's       Other:   -Standing Lab Orders Per Anesthesia    Lab other: UA&Urine Culture  -Standing Test orders EKG/Chest x-ray per Anesthesia       Test other:   - Medications:  Ancef 2gm IV  -Other orders:  N/A

## 2023-07-24 ENCOUNTER — Other Ambulatory Visit: Payer: Self-pay | Admitting: Nurse Practitioner

## 2023-07-24 DIAGNOSIS — E6609 Other obesity due to excess calories: Secondary | ICD-10-CM

## 2023-07-25 ENCOUNTER — Telehealth: Payer: Self-pay

## 2023-07-25 NOTE — Telephone Encounter (Signed)
Error

## 2023-07-27 ENCOUNTER — Telehealth: Payer: Self-pay

## 2023-07-27 DIAGNOSIS — N200 Calculus of kidney: Secondary | ICD-10-CM

## 2023-07-27 NOTE — Progress Notes (Signed)
   [facility] Surgical Posting Form  Surgery Date: Date: 09/18/2023  Surgeon: Dr. Vanna Scotland, MD  Inpt ( No  )   Outpt (Yes)   Obs ( No  )   Diagnosis: N20.0 Left Nephrolithiasis  -CPT: [phone]  Surgery: Left Ureteroscopy with Laser Lithotripsy and Stent Placement   Stop Anticoagulations: Yes, may continue ASA   Cardiac/Medical/Pulmonary Clearance needed: no  *Orders entered into EPIC  Date: 07/27/23   *Case booked in [place]  Date: 07/21/2023  *Notified pt of Surgery: Date: 07/21/2023  PRE-OP UA & CX: Yes, will obtain in clinic on 09/06/2023  *Placed into Prior Authorization Work Angela Nevin Date: 07/27/23  Assistant/laser/rep:No

## 2023-07-27 NOTE — Telephone Encounter (Signed)
  Per Dr. Apolinar Junes, Patient is to be scheduled for Left Ureteroscopy with Laser Lithotripsy and Stent Placement   Mrs. Villela was contacted and possible surgical dates were discussed, Monday February 17th, 2025 was agreed upon for surgery.   Patient was instructed that Dr. Apolinar Junes will require them to provide a pre-op UA & CX prior to surgery. This was ordered and scheduled drop off appointment was made for 09/06/2023.    Patient was directed to call [phone] between 1-3pm the day before surgery to find out surgical arrival time.  Instructions were given not to eat or drink from midnight on the night before surgery and have a driver for the day of surgery. On the surgery day patient was instructed to enter through the [facility] entrance of [facility] report the Same Day Surgery desk.   Pre-Admit Testing will be in contact via phone to set up an interview with the anesthesia team to review your history and medications prior to surgery.   Reminder of this information was sent via MyChart to the patient.

## 2023-08-19 ENCOUNTER — Other Ambulatory Visit: Payer: Self-pay | Admitting: Nurse Practitioner

## 2023-08-19 DIAGNOSIS — E66812 Obesity, class 2: Secondary | ICD-10-CM

## 2023-08-19 DIAGNOSIS — E6609 Other obesity due to excess calories: Secondary | ICD-10-CM

## 2023-08-19 DIAGNOSIS — R635 Abnormal weight gain: Secondary | ICD-10-CM

## 2023-08-24 LAB — POTASSIUM: Potassium: 4.7 mmol/L (ref 3.5–5.2)

## 2023-08-28 ENCOUNTER — Ambulatory Visit: Payer: Commercial Managed Care - PPO | Admitting: Nurse Practitioner

## 2023-08-30 ENCOUNTER — Encounter: Payer: Self-pay | Admitting: Nurse Practitioner

## 2023-08-30 ENCOUNTER — Ambulatory Visit (INDEPENDENT_AMBULATORY_CARE_PROVIDER_SITE_OTHER): Payer: Commercial Managed Care - PPO | Admitting: Nurse Practitioner

## 2023-08-30 VITALS — BP 134/94 | HR 121 | Temp 98.9°F | Resp 16 | Ht 63.0 in | Wt 186.0 lb

## 2023-08-30 DIAGNOSIS — J0110 Acute frontal sinusitis, unspecified: Secondary | ICD-10-CM

## 2023-08-30 DIAGNOSIS — E876 Hypokalemia: Secondary | ICD-10-CM | POA: Diagnosis not present

## 2023-08-30 DIAGNOSIS — J029 Acute pharyngitis, unspecified: Secondary | ICD-10-CM

## 2023-08-30 DIAGNOSIS — R051 Acute cough: Secondary | ICD-10-CM

## 2023-08-30 DIAGNOSIS — R6889 Other general symptoms and signs: Secondary | ICD-10-CM | POA: Diagnosis not present

## 2023-08-30 LAB — POCT INFLUENZA A/B
Influenza A, POC: NEGATIVE
Influenza B, POC: NEGATIVE

## 2023-08-30 LAB — POCT RAPID STREP A (OFFICE): Rapid Strep A Screen: NEGATIVE

## 2023-08-30 MED ORDER — HYDROCOD POLI-CHLORPHE POLI ER 10-8 MG/5ML PO SUER
5.0000 mL | Freq: Every evening | ORAL | 0 refills | Status: DC | PRN
Start: 2023-08-30 — End: 2023-09-22

## 2023-08-30 MED ORDER — AMOXICILLIN-POT CLAVULANATE 875-125 MG PO TABS: 1.0000 | ORAL_TABLET | Freq: Two times a day (BID) | ORAL | 0 refills | Status: AC

## 2023-08-30 NOTE — Progress Notes (Signed)
[facility] [address] [place], [place] [address]  Internal MEDICINE  Office Visit Note  Patient Name: Joan Johnson  [id number]  [id number]  Date of Service: 08/30/2023  Chief Complaint  Patient presents with   Hypertension   Follow-up    Review labs--- Yesterday body ache, sore throat. Today as well and coughing     HPI Joan Johnson presents for a follow-up visit for acute sore throat and body aches, also lab result, weight loss.  Reports acute symptoms that started yesterday including body aches, sore throat, cough, headache, hoarseness, chills, fever, right ear pain, sinus pressure  Throat swab for strep and flu done today. Both tests are negative Weight loss -- no weight loss noted since last office visit . Not current taking the medication, wants to put this problem on hold for now, will call when she wants to follow up about it.  Potassium level has returned to normal range.     Current Medication: Outpatient Encounter Medications as of 08/30/2023  Medication Sig   ALPRAZolam (XANAX) 0.25 MG tablet Take 1 tablet (0.25 mg total) by mouth 2 (two) times daily as needed for anxiety.   amoxicillin-clavulanate (AUGMENTIN) 875-125 MG tablet Take 1 tablet by mouth 2 (two) times daily for 10 days. Take with food   benzonatate (TESSALON) 100 MG capsule Take 1 capsule (100 mg total) by mouth 2 (two) times daily as needed for cough.   EPINEPHrine 0.3 mg/0.3 mL IJ SOAJ injection Inject 0.3 mg into the muscle as needed for anaphylaxis.   hydroxychloroquine (PLAQUENIL) 200 MG tablet Take 200 mg by mouth daily.   hydrOXYzine (ATARAX) 10 MG tablet Take 1-2 tablets (10-20 mg total) by mouth 3 (three) times daily as needed (dizziness).   losartan (COZAAR) 50 MG tablet TAKE 1 TABLET(50 MG) BY MOUTH DAILY   mometasone (ELOCON) 0.1 % cream Use twice daily for up to 10 days in a row   Phendimetrazine Tartrate 105 MG CP24 Take 1 capsule (105 mg total) by mouth daily before breakfast.    rosuvastatin (CRESTOR) 10 MG tablet Take 1 tablet (10 mg total) by mouth daily.   topiramate (TOPAMAX) 25 MG tablet TAKE 1 TABLET(25 MG) BY MOUTH TWICE DAILY   Vitamin D, Ergocalciferol, (DRISDOL) 1.25 MG (50000 UNIT) CAPS capsule Take 1 capsule (50,000 Units total) by mouth once a week.   [DISCONTINUED] chlorpheniramine-HYDROcodone (TUSSIONEX) 10-8 MG/5ML Take 5 mLs by mouth at bedtime as needed for cough.   chlorpheniramine-HYDROcodone (TUSSIONEX) 10-8 MG/5ML Take 5 mLs by mouth at bedtime as needed for cough.   Facility-Administered Encounter Medications as of 08/30/2023  Medication   cyanocobalamin (VITAMIN B12) injection 1,000 mcg   cyanocobalamin (VITAMIN B12) injection 1,000 mcg    Surgical History: Past Surgical History:  Procedure Laterality Date   ANKLE SURGERY     AUGMENTATION MAMMAPLASTY     Breast Implant   CESAREAN SECTION     1991, 1989, 1987   COLONOSCOPY WITH PROPOFOL N/A 10/01/2020   Procedure: COLONOSCOPY WITH PROPOFOL;  Surgeon: Toney Reil, MD;  Location: [facility] ENDOSCOPY;  Service: Gastroenterology;  Laterality: N/A;   COSMETIC SURGERY     CYSTOSCOPY W/ RETROGRADES Left 07/19/2021   Procedure: CYSTOSCOPY WITH RETROGRADE PYELOGRAM;  Surgeon: Vanna Scotland, MD;  Location: [facility] ORS;  Service: Urology;  Laterality: Left;   CYSTOSCOPY/URETEROSCOPY/HOLMIUM LASER/STENT PLACEMENT Left 07/19/2021   Procedure: CYSTOSCOPY/URETEROSCOPY/HOLMIUM LASER/STENT PLACEMENT;  Surgeon: Vanna Scotland, MD;  Location: [facility] ORS;  Service: Urology;  Laterality: Left;   dental implants  ESOPHAGOGASTRODUODENOSCOPY (EGD) WITH PROPOFOL N/A 10/01/2020   Procedure: ESOPHAGOGASTRODUODENOSCOPY (EGD) WITH PROPOFOL;  Surgeon: Toney Reil, MD;  Location: [facility] ENDOSCOPY;  Service: Gastroenterology;  Laterality: N/A;   HERNIA REPAIR     OTHER SURGICAL HISTORY     tummy tuck   OTHER SURGICAL HISTORY     carpal tunnel   TUBAL LIGATION      Medical History: Past Medical History:   Diagnosis Date   Allergy    Anxiety    Arthritis    History of kidney stones    Hypertension    Migraine    Osteopenia 06/2021   DEXA at [facility]; spine and hip    Family History: Family History  Problem Relation Age of Onset   Heart murmur Mother    Thyroid disease Daughter    Breast cancer Neg Hx    Ovarian cancer Neg Hx     Social History   Socioeconomic History   Marital status: Married    Spouse name: Joan Johnson   Number of children: 2   Years of education: Not on file   Highest education level: 12th grade  Occupational History   Not on file  Tobacco Use   Smoking status: Former    Current packs/day: 0.00    Average packs/day: 0.5 packs/day for 21.0 years (10.5 ttl pk-yrs)    Types: Cigarettes    Start date: 03/29/1997    Quit date: 03/29/2018    Years since quitting: 5.4    Passive exposure: Past   Smokeless tobacco: Never  Vaping Use   Vaping status: Never Used  Substance and Sexual Activity   Alcohol use: Yes    Alcohol/week: 2.0 standard drinks of alcohol    Types: 2 Glasses of wine per week    Comment: weekly   Drug use: Never   Sexual activity: Not Currently    Birth control/protection: Post-menopausal  Other Topics Concern   Not on file  Social History Narrative   ** Merged History Encounter **       Social Drivers of Health   Financial Resource Strain: Low Risk  (10/19/2022)   Overall Financial Resource Strain (CARDIA)    Difficulty of Paying Living Expenses: Not hard at all  Food Insecurity: No Food Insecurity (10/19/2022)   Hunger Vital Sign    Worried About Running Out of Food in the Last Year: Never true    Ran Out of Food in the Last Year: Never true  Transportation Needs: No Transportation Needs (10/19/2022)   PRAPARE - Administrator, Civil Service (Medical): No    Lack of Transportation (Non-Medical): No  Physical Activity: Not on file  Stress: No Stress Concern Present (10/19/2022)   [employer] of Occupational Health -  Occupational Stress Questionnaire    Feeling of Stress : Not at all  Social Connections: Moderately Isolated (10/19/2022)   Social Connection and Isolation Panel [NHANES]    Frequency of Communication with Friends and Family: More than three times a week    Frequency of Social Gatherings with Friends and Family: More than three times a week    Attends Religious Services: Never    Database administrator or Organizations: No    Attends Engineer, structural: Not on file    Marital Status: Married  Catering manager Violence: Not on file      Review of Systems  Constitutional:  Positive for chills, fatigue and fever. Negative for appetite change.  HENT:  Positive for postnasal  drip, sinus pressure, sore throat and voice change. Negative for rhinorrhea, sinus pain and sneezing.   Respiratory:  Positive for cough. Negative for chest tightness, shortness of breath and wheezing.   Cardiovascular:  Negative for chest pain and palpitations.  Musculoskeletal:  Positive for myalgias (body aches).  Neurological:  Positive for headaches.    Vital Signs: BP (!) 134/94   Pulse (!) 121   Temp 98.9 F (37.2 C)   Resp 16   Ht 5\' 3"  (1.6 m)   Wt 186 lb (84.4 kg)   SpO2 98%   BMI 32.95 kg/m    Physical Exam Vitals reviewed.  Constitutional:      General: She is not in acute distress.    Appearance: Normal appearance. She is obese. She is ill-appearing.  HENT:     Head: Normocephalic and atraumatic.     Right Ear: Swelling (erythematous ear canal) and tenderness present.     Left Ear: Swelling present.     Nose: Congestion and rhinorrhea present.     Mouth/Throat:     Mouth: Mucous membranes are moist.     Pharynx: Posterior oropharyngeal erythema present.  Eyes:     Pupils: Pupils are equal, round, and reactive to light.  Cardiovascular:     Rate and Rhythm: Regular rhythm. Tachycardia present.     Heart sounds: Normal heart sounds. No murmur heard. Pulmonary:     Effort:  Pulmonary effort is normal. No respiratory distress.     Breath sounds: Normal breath sounds. No wheezing.  Lymphadenopathy:     Cervical: Cervical adenopathy present.  Skin:    General: Skin is warm and dry.     Capillary Refill: Capillary refill takes less than 2 seconds.  Neurological:     Mental Status: She is alert and oriented to person, place, and time.  Psychiatric:        Mood and Affect: Mood normal.        Behavior: Behavior normal.        Assessment/Plan: 1. Acute non-recurrent frontal sinusitis (Primary) Augmentin prescribed, take until gone, use cough medication as needed.  - amoxicillin-clavulanate (AUGMENTIN) 875-125 MG tablet; Take 1 tablet by mouth 2 (two) times daily for 10 days. Take with food  Dispense: 20 tablet; Refill: 0 - chlorpheniramine-HYDROcodone (TUSSIONEX) 10-8 MG/5ML; Take 5 mLs by mouth at bedtime as needed for cough.  Dispense: 70 mL; Refill: 0  2. Acute cough Cough medication sent to pharmacy  - chlorpheniramine-HYDROcodone (TUSSIONEX) 10-8 MG/5ML; Take 5 mLs by mouth at bedtime as needed for cough.  Dispense: 70 mL; Refill: 0  3. Sore throat Strep test negative  - POCT rapid strep A  4. Flu-like symptoms Flu swab negative  - POCT Influenza A/B  5. Hypokalemia Resolved.   General Counseling: ramiah helfrich understanding of the findings of todays visit and agrees with plan of treatment. I have discussed any further diagnostic evaluation that may be needed or ordered today. We also reviewed her medications today. she has been encouraged to call the office with any questions or concerns that should arise related to todays visit.    Orders Placed This Encounter  Procedures   POCT Influenza A/B   POCT rapid strep A    Meds ordered this encounter  Medications   amoxicillin-clavulanate (AUGMENTIN) 875-125 MG tablet    Sig: Take 1 tablet by mouth 2 (two) times daily for 10 days. Take with food    Dispense:  20 tablet    Refill:  0    chlorpheniramine-HYDROcodone (TUSSIONEX) 10-8 MG/5ML    Sig: Take 5 mLs by mouth at bedtime as needed for cough.    Dispense:  70 mL    Refill:  0    Return if symptoms worsen or fail to improve.   Total time spent:30 Minutes Time spent includes review of chart, medications, test results, and follow up plan with the patient.   [place] Controlled Substance Database was reviewed by me.  This patient was seen by Sallyanne Kuster, FNP-C in collaboration with Dr. Beverely Risen as a part of collaborative care agreement.   Ionia Schey R. Tedd Sias, MSN, FNP-C Internal medicine

## 2023-08-31 ENCOUNTER — Other Ambulatory Visit: Payer: Self-pay | Admitting: Nurse Practitioner

## 2023-08-31 NOTE — Telephone Encounter (Signed)
Please review and send

## 2023-09-02 DIAGNOSIS — Z780 Asymptomatic menopausal state: Secondary | ICD-10-CM

## 2023-09-02 DIAGNOSIS — M8580 Other specified disorders of bone density and structure, unspecified site: Secondary | ICD-10-CM

## 2023-09-02 HISTORY — DX: Asymptomatic menopausal state: Z78.0

## 2023-09-02 HISTORY — DX: Asymptomatic menopausal state: M85.80

## 2023-09-04 NOTE — Progress Notes (Signed)
 PCP: Liana Fish, NP   Chief Complaint  Patient presents with   Gynecologic Exam    No concerns    HPI:      [name] is a 53 y.o. G3P3000 whose LMP was No LMP recorded. Patient is postmenopausal., presents today for her annual examination.  Her menses are absent due to menopause; pt states LMP was in her 30s after IVF tx, never did HRT. She does have vasomotor sx although went years without them. No PMB. Did have episode of frank bleeding 10/24 and went to ED. Was noted it was from bladder and not vagina. Pt seeing urology and had neg eval so far, has upcoming cystoscopy. Hx of kidney stones since adolescence. No bleeding since.   Sex activity: single partner; contraception - post menopausal status. She does have vaginal dryness and dyspareunia, improved with lubricants. Did vag ERT in past but didn't like cream. Pt gets vaginal irritation day after sex for many yrs; wonders if semen affects pH balance. Treats with monistat ext with some relief. Husband uses scented man soap. Has noticed a little improvement with using lubricants.   Last Pap: 06/17/21; Results were: no abnormalities /neg HPV DNA. No hx of abn paps.   Last mammogram: 09/20/22 at [facility]; Results were: normal--routine follow-up in 12 months There is no FH of breast cancer. There is no FH of ovarian cancer. The patient does do self-breast exams. Pt's mom s/p hyst due to AUB/? Early cancer. No chemo/rad done; no hx of ovar ca.   Colonoscopy: 3/22 with [facility] with polyps; Repeat due after 7 years.  DEXA: 11/22 at [facility]; osteopenia in spine/hip; getting calcium  supp, taking Vit D supp.   Tobacco use: The patient denies current or previous tobacco use. Alcohol use: social drinker No drug use Exercise: mod active  She does get adequate calcium  and Vitamin D  in her diet.  Labs with PCP. Having issues with SUI and urge incont. Drinks 2 caffeinated drinks in AM, then lots of water. Also with OAB sx. Hasn't  tried pelvic exercises, not using vag ERT.    Patient Active Problem List   Diagnosis Date Noted   Atopic dermatitis 10/05/2022   Constipation 10/05/2022   Chronic bilateral low back pain without sciatica 06/21/2022   Anxiety 04/06/2022   Dependent edema 01/11/2022   Premature menopause 06/17/2021   Osteopenia 06/17/2021   TMJ (temporomandibular joint syndrome) 06/03/2021   Mixed hyperlipidemia 06/03/2021   Memory changes 06/03/2021   Hair thinning 09/01/2020   Hypertension 09/01/2020   Hx of migraines 07/29/2020   Gastroesophageal reflux disease 07/29/2020   Vitamin D  deficiency 07/29/2020   Lung nodule 07/29/2020   Rheumatoid arthritis with positive rheumatoid factor (HCC) 10/17/2017   BMI 36.0-36.9,adult 09/19/2017    Past Surgical History:  Procedure Laterality Date   ANKLE SURGERY     AUGMENTATION MAMMAPLASTY     Breast Implant   CESAREAN SECTION     1991, 1989, 1987   COLONOSCOPY WITH PROPOFOL  N/A 10/01/2020   Procedure: COLONOSCOPY WITH PROPOFOL ;  Surgeon: Unk Corinn Skiff, MD;  Location: [facility] ENDOSCOPY;  Service: Gastroenterology;  Laterality: N/A;   COSMETIC SURGERY     CYSTOSCOPY W/ RETROGRADES Left 07/19/2021   Procedure: CYSTOSCOPY WITH RETROGRADE PYELOGRAM;  Surgeon: Penne Knee, MD;  Location: [facility] ORS;  Service: Urology;  Laterality: Left;   CYSTOSCOPY/URETEROSCOPY/HOLMIUM LASER/STENT PLACEMENT Left 07/19/2021   Procedure: CYSTOSCOPY/URETEROSCOPY/HOLMIUM LASER/STENT PLACEMENT;  Surgeon: Penne Knee, MD;  Location: [facility] ORS;  Service: Urology;  Laterality:  Left;   dental implants     ESOPHAGOGASTRODUODENOSCOPY (EGD) WITH PROPOFOL  N/A 10/01/2020   Procedure: ESOPHAGOGASTRODUODENOSCOPY (EGD) WITH PROPOFOL ;  Surgeon: Unk Corinn Skiff, MD;  Location: [facility] ENDOSCOPY;  Service: Gastroenterology;  Laterality: N/A;   HERNIA REPAIR     OTHER SURGICAL HISTORY     tummy tuck   OTHER SURGICAL HISTORY     carpal tunnel   TUBAL LIGATION      Family  History  Problem Relation Age of Onset   Heart murmur Mother    Thyroid disease Daughter    Breast cancer Neg Hx    Ovarian cancer Neg Hx     Social History   Socioeconomic History   Marital status: Married    Spouse name: Luis   Number of children: 2   Years of education: Not on file   Highest education level: 12th grade  Occupational History   Not on file  Tobacco Use   Smoking status: Former    Current packs/day: 0.00    Average packs/day: 0.5 packs/day for 21.0 years (10.5 ttl pk-yrs)    Types: Cigarettes    Start date: 03/29/1997    Quit date: 03/29/2018    Years since quitting: 5.4    Passive exposure: Past   Smokeless tobacco: Never  Vaping Use   Vaping status: Never Used  Substance and Sexual Activity   Alcohol use: Yes    Alcohol/week: 2.0 standard drinks of alcohol    Types: 2 Glasses of wine per week    Comment: weekly   Drug use: Never   Sexual activity: Yes    Birth control/protection: Post-menopausal  Other Topics Concern   Not on file  Social History Narrative   ** Merged History Encounter **       Social Drivers of Health   Financial Resource Strain: Low Risk  (10/19/2022)   Overall Financial Resource Strain (CARDIA)    Difficulty of Paying Living Expenses: Not hard at all  Food Insecurity: No Food Insecurity (10/19/2022)   Hunger Vital Sign    Worried About Running Out of Food in the Last Year: Never true    Ran Out of Food in the Last Year: Never true  Transportation Needs: No Transportation Needs (10/19/2022)   PRAPARE - Administrator, Civil Service (Medical): No    Lack of Transportation (Non-Medical): No  Physical Activity: Not on file  Stress: No Stress Concern Present (10/19/2022)   [employer] of Occupational Health - Occupational Stress Questionnaire    Feeling of Stress : Not at all  Social Connections: Moderately Isolated (10/19/2022)   Social Connection and Isolation Panel [NHANES]    Frequency of Communication with  Friends and Family: More than three times a week    Frequency of Social Gatherings with Friends and Family: More than three times a week    Attends Religious Services: Never    Database Administrator or Organizations: No    Attends Engineer, Structural: Not on file    Marital Status: Married  Catering Manager Violence: Not on file     Current Outpatient Medications:    acetaminophen  (TYLENOL ) 500 MG tablet, Take 1,000 mg by mouth every 6 (six) hours as needed for headache., Disp: , Rfl:    ALPRAZolam  (XANAX ) 0.25 MG tablet, Take 1 tablet (0.25 mg total) by mouth 2 (two) times daily as needed for anxiety., Disp: 20 tablet, Rfl: 0   amoxicillin -clavulanate (AUGMENTIN ) 875-125 MG tablet, Take 1 tablet by  mouth 2 (two) times daily for 10 days. Take with food, Disp: 20 tablet, Rfl: 0   calcium  carbonate (OSCAL) 1500 (600 Ca) MG TABS tablet, Take 600 mg of elemental calcium  by mouth daily., Disp: , Rfl:    chlorpheniramine-HYDROcodone  (TUSSIONEX) 10-8 MG/5ML, Take 5 mLs by mouth at bedtime as needed for cough., Disp: 70 mL, Rfl: 0   EPINEPHrine  0.3 mg/0.3 mL IJ SOAJ injection, Inject 0.3 mg into the muscle as needed for anaphylaxis., Disp: 2 each, Rfl: 0   Estradiol  (IMVEXXY  MAINTENANCE PACK) 10 MCG INST, Insert 1 supp vaginally twice wkly, Disp: 24 each, Rfl: 3   Estradiol  Starter Pack (IMVEXXY  STARTER PACK) 10 MCG INST, Insert 1 supp vaginally nightly for 2 wks, then twice wkly, Disp: 18 each, Rfl: 0   hydroxychloroquine (PLAQUENIL) 200 MG tablet, Take 200 mg by mouth daily., Disp: , Rfl:    losartan  (COZAAR ) 50 MG tablet, TAKE 1 TABLET(50 MG) BY MOUTH DAILY, Disp: 90 tablet, Rfl: 2   Vitamin D , Ergocalciferol , (DRISDOL ) 1.25 MG (50000 UNIT) CAPS capsule, TAKE 1 CAPSULE BY MOUTH 1 TIME A WEEK, Disp: 4 capsule, Rfl: 3  Current Facility-Administered Medications:    cyanocobalamin  (VITAMIN B12) injection 1,000 mcg, 1,000 mcg, Intramuscular, Once,    cyanocobalamin  (VITAMIN B12) injection  1,000 mcg, 1,000 mcg, Intramuscular, Once,      ROS:  Review of Systems  Constitutional:  Negative for fatigue, fever and unexpected weight change.  Respiratory:  Negative for cough, shortness of breath and wheezing.   Cardiovascular:  Negative for chest pain, palpitations and leg swelling.  Gastrointestinal:  Positive for constipation. Negative for blood in stool, diarrhea, nausea and vomiting.  Endocrine: Negative for cold intolerance, heat intolerance and polyuria.  Genitourinary:  Positive for frequency. Negative for dyspareunia, dysuria, flank pain, genital sores, hematuria, menstrual problem, pelvic pain, urgency, vaginal bleeding, vaginal discharge and vaginal pain.  Musculoskeletal:  Negative for back pain, joint swelling and myalgias.  Skin:  Negative for rash.  Neurological:  Negative for dizziness, syncope, light-headedness, numbness and headaches.  Hematological:  Negative for adenopathy.  Psychiatric/Behavioral:  Negative for agitation, confusion, sleep disturbance and suicidal ideas. The patient is not nervous/anxious.    BREAST: No symptoms    Objective: BP 118/80   Ht 5' 3 (1.6 m)   Wt 186 lb (84.4 kg)   BMI 32.95 kg/m    Physical Exam Constitutional:      Appearance: She is well-developed.  Genitourinary:     Vulva normal.     Right Labia: No rash, tenderness or lesions.    Left Labia: No tenderness, lesions or rash.    No vaginal discharge, erythema or tenderness.     Moderate vaginal atrophy present.     Right Adnexa: not tender and no mass present.    Left Adnexa: not tender and no mass present.    No cervical friability or polyp.     Uterus is not enlarged or tender.  Breasts:    Right: No mass, nipple discharge, skin change or tenderness.     Left: No mass, nipple discharge, skin change or tenderness.  Neck:     Thyroid: No thyromegaly.  Cardiovascular:     Rate and Rhythm: Normal rate and regular rhythm.     Heart sounds: Normal heart  sounds. No murmur heard. Pulmonary:     Effort: Pulmonary effort is normal.     Breath sounds: Normal breath sounds.  Abdominal:     Palpations: Abdomen is soft.  Tenderness: There is no abdominal tenderness. There is no guarding or rebound.  Musculoskeletal:        General: Normal range of motion.     Cervical back: Normal range of motion.  Lymphadenopathy:     Cervical: No cervical adenopathy.  Neurological:     General: No focal deficit present.     Mental Status: She is alert and oriented to person, place, and time.     Cranial Nerves: No cranial nerve deficit.  Skin:    General: Skin is warm and dry.  Psychiatric:        Mood and Affect: Mood normal.        Behavior: Behavior normal.        Thought Content: Thought content normal.        Judgment: Judgment normal.  Vitals reviewed.     Assessment/Plan:  Encounter for annual routine gynecological examination  Encounter for screening mammogram for malignant neoplasm of breast - Plan: MM 3D SCREENING MAMMOGRAM BILATERAL BREAST; pt to schedule mamm[facility] at [facility]  Premature menopause - Plan: DG Bone Density, pt to scheduel DEXA. Cont ca/Vit D/exercise  Osteopenia after menopause - Plan: DG Bone Density  Mixed incontinence urge and stress - Plan: Estradiol  Starter Pack (IMVEXXY  STARTER PACK) 10 MCG INST, Estradiol  (IMVEXXY  MAINTENANCE PACK) 10 MCG INST; pt to d/c caffeine, start pelvic floor exercises (can refer to pelvic PT prn), add vag ERT. Rx imvexxy  since pt didn't like vag ERT cream. Also recommended pt discuss with urology   Vaginal atrophy - Plan: Estradiol  Starter Pack (IMVEXXY  STARTER PACK) 10 MCG INST, Estradiol  (IMVEXXY  MAINTENANCE PACK) 10 MCG INST; start vag ERT. Pt with itching after sex, add ERT, use lubricants, husband to change to dove sens skin soap, can use OTC hydrocortisone crm ext prn. F/u prn.   Meds ordered this encounter  Medications   Estradiol  Starter Pack (IMVEXXY  STARTER PACK) 10 MCG INST    Sig:  Insert 1 supp vaginally nightly for 2 wks, then twice wkly    Dispense:  18 each    Refill:  0    Supervising Provider:   CHERRY, ANIKA [[id number]]   Estradiol  (IMVEXXY  MAINTENANCE PACK) 10 MCG INST    Sig: Insert 1 supp vaginally twice wkly    Dispense:  24 each    Refill:  3    Supervising Provider:   CHERRY, ANIKA [[id number]]           GYN counsel breast self exam, mammography screening, menopause, adequate intake of calcium  and vitamin D , diet and exercise    F/U  Return in about 1 year (around 09/04/2024).  Abena Erdman B. Ashanty Coltrane, PA-C 09/05/2023 11:46 AM

## 2023-09-05 ENCOUNTER — Encounter: Payer: Self-pay | Admitting: Obstetrics and Gynecology

## 2023-09-05 ENCOUNTER — Other Ambulatory Visit: Payer: Commercial Managed Care - PPO

## 2023-09-05 ENCOUNTER — Ambulatory Visit (INDEPENDENT_AMBULATORY_CARE_PROVIDER_SITE_OTHER): Payer: Commercial Managed Care - PPO | Admitting: Obstetrics and Gynecology

## 2023-09-05 VITALS — BP 118/80 | Ht 63.0 in | Wt 186.0 lb

## 2023-09-05 DIAGNOSIS — E28319 Asymptomatic premature menopause: Secondary | ICD-10-CM

## 2023-09-05 DIAGNOSIS — Z780 Asymptomatic menopausal state: Secondary | ICD-10-CM

## 2023-09-05 DIAGNOSIS — Z1231 Encounter for screening mammogram for malignant neoplasm of breast: Secondary | ICD-10-CM

## 2023-09-05 DIAGNOSIS — N952 Postmenopausal atrophic vaginitis: Secondary | ICD-10-CM

## 2023-09-05 DIAGNOSIS — Z01419 Encounter for gynecological examination (general) (routine) without abnormal findings: Secondary | ICD-10-CM | POA: Diagnosis not present

## 2023-09-05 DIAGNOSIS — N3946 Mixed incontinence: Secondary | ICD-10-CM

## 2023-09-05 DIAGNOSIS — N200 Calculus of kidney: Secondary | ICD-10-CM

## 2023-09-05 LAB — URINALYSIS, COMPLETE
Bilirubin, UA: NEGATIVE
Glucose, UA: NEGATIVE
Ketones, UA: NEGATIVE
Leukocytes,UA: NEGATIVE
Nitrite, UA: NEGATIVE
Protein,UA: NEGATIVE
RBC, UA: NEGATIVE
Specific Gravity, UA: 1.02 (ref 1.005–1.030)
Urobilinogen, Ur: 0.2 mg/dL (ref 0.2–1.0)
pH, UA: 6.5 (ref 5.0–7.5)

## 2023-09-05 LAB — MICROSCOPIC EXAMINATION

## 2023-09-05 MED ORDER — IMVEXXY STARTER PACK 10 MCG VA INST: VAGINAL_INSERT | VAGINAL | 0 refills | Status: DC

## 2023-09-05 MED ORDER — IMVEXXY MAINTENANCE PACK 10 MCG VA INST: VAGINAL_INSERT | VAGINAL | 3 refills | Status: DC

## 2023-09-05 NOTE — Patient Instructions (Addendum)
 I value your feedback and you entrusting us  with your care. If you get a [place] patient survey, I would appreciate you taking the time to let us  know about your experience today. Thank you!   [facility]: [phone] and bone density

## 2023-09-06 ENCOUNTER — Other Ambulatory Visit: Payer: Self-pay

## 2023-09-07 ENCOUNTER — Other Ambulatory Visit: Payer: Self-pay

## 2023-09-07 DIAGNOSIS — E28319 Asymptomatic premature menopause: Secondary | ICD-10-CM

## 2023-09-07 DIAGNOSIS — Z780 Asymptomatic menopausal state: Secondary | ICD-10-CM

## 2023-09-07 DIAGNOSIS — Z1231 Encounter for screening mammogram for malignant neoplasm of breast: Secondary | ICD-10-CM

## 2023-09-08 LAB — CULTURE, URINE COMPREHENSIVE

## 2023-09-11 ENCOUNTER — Other Ambulatory Visit: Payer: Self-pay

## 2023-09-11 ENCOUNTER — Encounter
Admission: RE | Admit: 2023-09-11 | Discharge: 2023-09-11 | Disposition: A | Discharge status: Home / Self Care | Payer: Commercial Managed Care - PPO | Source: Ambulatory Visit | Attending: Urology

## 2023-09-11 ENCOUNTER — Encounter
Admission: RE | Admit: 2023-09-11 | Discharge: 2023-09-11 | Disposition: A | Discharge status: Home / Self Care | Payer: Commercial Managed Care - PPO | Source: Ambulatory Visit | Attending: Urology | Admitting: Urology

## 2023-09-11 DIAGNOSIS — I10 Essential (primary) hypertension: Secondary | ICD-10-CM | POA: Insufficient documentation

## 2023-09-11 DIAGNOSIS — Z01810 Encounter for preprocedural cardiovascular examination: Secondary | ICD-10-CM | POA: Diagnosis not present

## 2023-09-11 DIAGNOSIS — Z01818 Encounter for other preprocedural examination: Secondary | ICD-10-CM | POA: Insufficient documentation

## 2023-09-11 HISTORY — DX: Calculus of kidney: N20.0

## 2023-09-11 HISTORY — DX: Systemic involvement of connective tissue, unspecified: M35.9

## 2023-09-11 HISTORY — DX: Pneumonia, unspecified organism: J18.9

## 2023-09-11 HISTORY — DX: Gastro-esophageal reflux disease without esophagitis: K21.9

## 2023-09-11 LAB — CBC
HCT: 36.9 % (ref 36.0–46.0)
Hemoglobin: 12.5 g/dL (ref 12.0–15.0)
MCH: 31.6 pg (ref 26.0–34.0)
MCHC: 33.9 g/dL (ref 30.0–36.0)
MCV: 93.2 fL (ref 80.0–100.0)
Platelets: 349 10*3/uL (ref 150–400)
RBC: 3.96 MIL/uL (ref 3.87–5.11)
RDW: 12.5 % (ref 11.5–15.5)
WBC: 7.4 10*3/uL (ref 4.0–10.5)
nRBC: 0 % (ref 0.0–0.2)

## 2023-09-11 LAB — BASIC METABOLIC PANEL
Anion gap: 11 (ref 5–15)
BUN: 11 mg/dL (ref 6–20)
CO2: 28 mmol/L (ref 22–32)
Calcium: 9.5 mg/dL (ref 8.9–10.3)
Chloride: 107 mmol/L (ref 98–111)
Creatinine, Ser: 0.55 mg/dL (ref 0.44–1.00)
GFR, Estimated: >60 mL/min (ref >60)
Glucose, Bld: 90 mg/dL (ref 70–99)
Potassium: 4.1 mmol/L (ref 3.5–5.1)
Sodium: 146 mmol/L — ABNORMAL HIGH (ref 135–145)

## 2023-09-11 NOTE — Patient Instructions (Addendum)
 Your procedure is scheduled on: 07/17/24 - Monday Report to the Registration Desk on the 1st floor of the [facility]. To find out your arrival time, please call [phone] between 1PM - 3PM on: 07/14/24 - Friday If your arrival time is 6:00 am, do not arrive before that time as the [facility] entrance doors do not open until 6:00 am.  REMEMBER: Instructions that are not followed completely may result in serious medical risk, up to and including death; or upon the discretion of your surgeon and anesthesiologist your surgery may need to be rescheduled.  Do not eat food or drink any liquids after midnight the night before surgery.  No gum chewing or hard candies.   One week prior to surgery: Stop Anti-inflammatories (NSAIDS) such as Advil , Aleve , Ibuprofen , Motrin , Naproxen , Naprosyn  and Aspirin based products such as Excedrin, Goody's Powder, BC Powder. You may continue to take Tylenol  if needed for pain up until the day of surgery.  Stop ANY OVER THE COUNTER supplements until after surgery.   Hold your Losartan  on the day of surgery.   ON THE DAY OF SURGERY ONLY TAKE THESE MEDICATIONS WITH SIPS OF WATER:  hydroxychloroquine (PLAQUENIL)   No Alcohol for 24 hours before or after surgery.  No Smoking including e-cigarettes for 24 hours before surgery.  No chewable tobacco products for at least 6 hours before surgery.  No nicotine patches on the day of surgery.  Do not use any "recreational" drugs for at least a week (preferably 2 weeks) before your surgery.  Please be advised that the combination of cocaine and anesthesia may have negative outcomes, up to and including death. If you test positive for cocaine, your surgery will be cancelled.  On the morning of surgery brush your teeth with toothpaste and water, you may rinse your mouth with mouthwash if you wish. Do not swallow any toothpaste or mouthwash.  Do not wear jewelry, make-up, hairpins, clips or nail polish.  For  welded (permanent) jewelry: bracelets, anklets, waist bands, etc.  Please have this removed prior to surgery.  If it is not removed, there is a chance that hospital personnel will need to cut it off on the day of surgery.  Do not wear lotions, powders, or perfumes.   Do not shave body hair from the neck down 48 hours before surgery.  Contact lenses, hearing aids and dentures may not be worn into surgery.  Do not bring valuables to the hospital. [facility] is not responsible for any missing/lost belongings or valuables.   Notify your doctor if there is any change in your medical condition (cold, fever, infection).  Wear comfortable clothing (specific to your surgery type) to the hospital.  After surgery, you can help prevent lung complications by doing breathing exercises.  Take deep breaths and cough every 1-2 hours. Your doctor may order a device called an Incentive Spirometer to help you take deep breaths. When coughing or sneezing, hold a pillow firmly against your incision with both hands. This is called "splinting." Doing this helps protect your incision. It also decreases belly discomfort.  If you are being admitted to the hospital overnight, leave your suitcase in the car. After surgery it may be brought to your room.  In case of increased patient census, it may be necessary for you, the patient, to continue your postoperative care in the Same Day Surgery department.  If you are being discharged the day of surgery, you will not be allowed to drive home. You  will need a responsible individual to drive you home and stay with you for 24 hours after surgery.   If you are taking public transportation, you will need to have a responsible individual with you.  Please call the Pre-admissions Testing Dept. at [phone] if you have any questions about these instructions.  Surgery Visitation Policy:  Patients having surgery or a procedure may have two visitors.  Children under the  age of 46 must have an adult with them who is not the patient.  Temporary Visitor Restrictions Due to increasing cases of flu, RSV and COVID-19: Children ages 84 and under will not be able to visit patients in [facility] hospitals under most circumstances.  Inpatient Visitation:    Visiting hours are 7 a.m. to 8 p.m. Up to four visitors are allowed at one time in a patient room. The visitors may rotate out with other people during the day.  One visitor age 79 or older may stay with the patient overnight and must be in the room by 8 p.m.

## 2023-09-15 ENCOUNTER — Telehealth: Payer: Self-pay

## 2023-09-15 NOTE — Telephone Encounter (Signed)
[facility] calling triage to ask about Dexa Scan Order for pt? If it can be faxed to [phone].

## 2023-09-16 ENCOUNTER — Telehealth: Payer: Self-pay | Admitting: Nurse Practitioner

## 2023-09-16 NOTE — Telephone Encounter (Signed)
Order placed at 2/25 annual. Pls fax to [facility] (? [facility]). Let me know if I need to sign anything. Thx.

## 2023-09-18 ENCOUNTER — Other Ambulatory Visit: Payer: Self-pay

## 2023-09-18 ENCOUNTER — Ambulatory Visit: Payer: Commercial Managed Care - PPO

## 2023-09-18 ENCOUNTER — Ambulatory Visit: Payer: Commercial Managed Care - PPO | Admitting: Anesthesiology

## 2023-09-18 ENCOUNTER — Encounter: Payer: Self-pay | Admitting: Urology

## 2023-09-18 ENCOUNTER — Ambulatory Visit: Payer: Commercial Managed Care - PPO | Admitting: Urgent Care

## 2023-09-18 ENCOUNTER — Encounter
Admission: RE | Disposition: A | Discharge status: Home / Self Care | Payer: Self-pay | Source: Home / Self Care | Attending: Urology

## 2023-09-18 ENCOUNTER — Ambulatory Visit
Admission: RE | Admit: 2023-09-18 | Discharge: 2023-09-18 | Disposition: A | Discharge status: Home / Self Care | Payer: Commercial Managed Care - PPO | Source: Home / Self Care | Attending: Urology | Admitting: Urology

## 2023-09-18 DIAGNOSIS — F419 Anxiety disorder, unspecified: Secondary | ICD-10-CM | POA: Insufficient documentation

## 2023-09-18 DIAGNOSIS — Z79899 Other long term (current) drug therapy: Secondary | ICD-10-CM | POA: Insufficient documentation

## 2023-09-18 DIAGNOSIS — N200 Calculus of kidney: Secondary | ICD-10-CM | POA: Insufficient documentation

## 2023-09-18 DIAGNOSIS — I10 Essential (primary) hypertension: Secondary | ICD-10-CM | POA: Insufficient documentation

## 2023-09-18 DIAGNOSIS — Z87891 Personal history of nicotine dependence: Secondary | ICD-10-CM | POA: Insufficient documentation

## 2023-09-18 DIAGNOSIS — N390 Urinary tract infection, site not specified: Secondary | ICD-10-CM | POA: Diagnosis not present

## 2023-09-18 DIAGNOSIS — N9989 Other postprocedural complications and disorders of genitourinary system: Secondary | ICD-10-CM | POA: Diagnosis not present

## 2023-09-18 HISTORY — PX: CYSTOSCOPY/URETEROSCOPY/HOLMIUM LASER/STENT PLACEMENT: SHX6546

## 2023-09-18 SURGERY — CYSTOSCOPY/URETEROSCOPY/HOLMIUM LASER/STENT PLACEMENT
Anesthesia: General | Site: Ureter | Laterality: Left

## 2023-09-18 MED ORDER — LACTATED RINGERS IV SOLN: INTRAVENOUS | Status: DC

## 2023-09-18 MED ORDER — OXYBUTYNIN CHLORIDE 5 MG PO TABS
5.0000 mg | ORAL_TABLET | Freq: Once | ORAL | Status: AC
  Administered 2023-09-18: 5 mg via ORAL
  Filled 2023-09-18: qty 1

## 2023-09-18 MED ORDER — FENTANYL CITRATE (PF) 100 MCG/2ML IJ SOLN
INTRAMUSCULAR | Status: AC
  Filled 2023-09-18: qty 2

## 2023-09-18 MED ORDER — MIDAZOLAM HCL 2 MG/2ML IJ SOLN
INTRAMUSCULAR | Status: AC
  Filled 2023-09-18: qty 2

## 2023-09-18 MED ORDER — PHENYLEPHRINE 80 MCG/ML (10ML) SYRINGE FOR IV PUSH (FOR BLOOD PRESSURE SUPPORT)
PREFILLED_SYRINGE | INTRAVENOUS | Status: DC | PRN
  Administered 2023-09-18 (×2): 80 ug via INTRAVENOUS

## 2023-09-18 MED ORDER — ONDANSETRON HCL 4 MG/2ML IJ SOLN: 4.0000 mg | Freq: Once | INTRAMUSCULAR | Status: DC | PRN

## 2023-09-18 MED ORDER — ONDANSETRON HCL 4 MG/2ML IJ SOLN
INTRAMUSCULAR | Status: AC
  Filled 2023-09-18: qty 2

## 2023-09-18 MED ORDER — ONDANSETRON HCL 4 MG/2ML IJ SOLN
INTRAMUSCULAR | Status: DC | PRN
  Administered 2023-09-18: 4 mg via INTRAVENOUS

## 2023-09-18 MED ORDER — OXYBUTYNIN CHLORIDE 5 MG PO TABS: 5.0000 mg | ORAL_TABLET | Freq: Three times a day (TID) | ORAL | 0 refills | Status: DC | PRN

## 2023-09-18 MED ORDER — PHENYLEPHRINE HCL-NACL 20-0.9 MG/250ML-% IV SOLN: INTRAVENOUS | Status: DC | PRN

## 2023-09-18 MED ORDER — DEXAMETHASONE SODIUM PHOSPHATE 10 MG/ML IJ SOLN
INTRAMUSCULAR | Status: DC | PRN
  Administered 2023-09-18: 10 mg via INTRAVENOUS

## 2023-09-18 MED ORDER — ACETAMINOPHEN 10 MG/ML IV SOLN
INTRAVENOUS | Status: DC | PRN
  Administered 2023-09-18: 1000 mg via INTRAVENOUS

## 2023-09-18 MED ORDER — LIDOCAINE HCL (PF) 2 % IJ SOLN
INTRAMUSCULAR | Status: AC
  Filled 2023-09-18: qty 5

## 2023-09-18 MED ORDER — IOHEXOL 180 MG/ML  SOLN
INTRAMUSCULAR | Status: DC | PRN
  Administered 2023-09-18: 20 mL

## 2023-09-18 MED ORDER — ACETAMINOPHEN 10 MG/ML IV SOLN
INTRAVENOUS | Status: AC
  Filled 2023-09-18: qty 100

## 2023-09-18 MED ORDER — FENTANYL CITRATE (PF) 100 MCG/2ML IJ SOLN: 25.0000 ug | INTRAMUSCULAR | Status: DC | PRN

## 2023-09-18 MED ORDER — ACETAMINOPHEN 10 MG/ML IV SOLN: 1000.0000 mg | Freq: Once | INTRAVENOUS | Status: DC | PRN

## 2023-09-18 MED ORDER — CHLORHEXIDINE GLUCONATE 0.12 % MT SOLN
15.0000 mL | Freq: Once | OROMUCOSAL | Status: AC
  Administered 2023-09-18: 15 mL via OROMUCOSAL

## 2023-09-18 MED ORDER — DEXAMETHASONE SODIUM PHOSPHATE 10 MG/ML IJ SOLN
INTRAMUSCULAR | Status: AC
  Filled 2023-09-18: qty 1

## 2023-09-18 MED ORDER — ORAL CARE MOUTH RINSE: 15.0000 mL | Freq: Once | OROMUCOSAL | Status: AC

## 2023-09-18 MED ORDER — LIDOCAINE HCL (CARDIAC) PF 100 MG/5ML IV SOSY
PREFILLED_SYRINGE | INTRAVENOUS | Status: DC | PRN
  Administered 2023-09-18: 100 mg via INTRAVENOUS

## 2023-09-18 MED ORDER — OXYBUTYNIN CHLORIDE 5 MG PO TABS
ORAL_TABLET | ORAL | Status: AC
Start: 2023-09-18 — End: ?
  Filled 2023-09-18: qty 1

## 2023-09-18 MED ORDER — CHLORHEXIDINE GLUCONATE 0.12 % MT SOLN
OROMUCOSAL | Status: AC
  Filled 2023-09-18: qty 15

## 2023-09-18 MED ORDER — HYDROCODONE-ACETAMINOPHEN 5-325 MG PO TABS: 1.0000 | ORAL_TABLET | Freq: Four times a day (QID) | ORAL | 0 refills | Status: DC | PRN

## 2023-09-18 MED ORDER — SODIUM CHLORIDE 0.9 % IR SOLN
Status: DC | PRN
  Administered 2023-09-18: 3000 mL

## 2023-09-18 MED ORDER — TAMSULOSIN HCL 0.4 MG PO CAPS: 0.4000 mg | ORAL_CAPSULE | Freq: Every day | ORAL | 0 refills | Status: DC

## 2023-09-18 MED ORDER — MIDAZOLAM HCL 2 MG/2ML IJ SOLN
INTRAMUSCULAR | Status: DC | PRN
  Administered 2023-09-18: 2 mg via INTRAVENOUS

## 2023-09-18 MED ORDER — CEFAZOLIN SODIUM-DEXTROSE 2-4 GM/100ML-% IV SOLN
2.0000 g | INTRAVENOUS | Status: AC
  Administered 2023-09-18: 2 g via INTRAVENOUS

## 2023-09-18 MED ORDER — PROPOFOL 10 MG/ML IV BOLUS
INTRAVENOUS | Status: DC | PRN
  Administered 2023-09-18: 160 mg via INTRAVENOUS

## 2023-09-18 MED ORDER — OXYCODONE HCL 5 MG PO TABS
ORAL_TABLET | ORAL | Status: AC
  Filled 2023-09-18: qty 1

## 2023-09-18 MED ORDER — PROPOFOL 10 MG/ML IV BOLUS
INTRAVENOUS | Status: AC
  Filled 2023-09-18: qty 20

## 2023-09-18 MED ORDER — KETOROLAC TROMETHAMINE 30 MG/ML IJ SOLN
INTRAMUSCULAR | Status: DC | PRN
  Administered 2023-09-18: 30 mg via INTRAVENOUS

## 2023-09-18 MED ORDER — FENTANYL CITRATE (PF) 100 MCG/2ML IJ SOLN
INTRAMUSCULAR | Status: DC | PRN
  Administered 2023-09-18: 50 ug via INTRAVENOUS
  Administered 2023-09-18 (×2): 25 ug via INTRAVENOUS

## 2023-09-18 MED ORDER — CEFAZOLIN SODIUM-DEXTROSE 2-4 GM/100ML-% IV SOLN
INTRAVENOUS | Status: AC
Start: 2023-09-18 — End: ?
  Filled 2023-09-18: qty 100

## 2023-09-18 SURGICAL SUPPLY — 23 items
ADHESIVE MASTISOL STRL (MISCELLANEOUS) IMPLANT
BAG DRAIN SIEMENS DORNER NS (MISCELLANEOUS) ×1 IMPLANT
BASKET ZERO TIP 1.9FR (BASKET) IMPLANT
CATH URET FLEX-TIP 2 LUMEN 10F (CATHETERS) IMPLANT
CATH URETL OPEN 5X70 (CATHETERS) ×1 IMPLANT
CNTNR URN SCR LID CUP LEK RST (MISCELLANEOUS) IMPLANT
DRAPE UTILITY 15X26 TOWEL STRL (DRAPES) IMPLANT
DRSG TEGADERM 2-3/8X2-3/4 SM (GAUZE/BANDAGES/DRESSINGS) IMPLANT
FIBER LASER MOSES 200 DFL (Laser) IMPLANT
FIBER LASER MOSES 365 DFL (Laser) IMPLANT
GLOVE BIO SURGEON STRL SZ 6.5 (GLOVE) ×1 IMPLANT
GOWN STRL REUS W/ TWL LRG LVL3 (GOWN DISPOSABLE) ×2 IMPLANT
GUIDEWIRE GREEN .038 145CM (MISCELLANEOUS) IMPLANT
GUIDEWIRE STR DUAL SENSOR (WIRE) ×1 IMPLANT
IV NS IRRIG 3000ML ARTHROMATIC (IV SOLUTION) ×1 IMPLANT
KIT TURNOVER CYSTO (KITS) ×1 IMPLANT
PACK CYSTO AR (MISCELLANEOUS) ×1 IMPLANT
SET CYSTO W/LG BORE CLAMP LF (SET/KITS/TRAYS/PACK) ×1 IMPLANT
SHEATH NAVIGATOR HD 12/14X36 (SHEATH) IMPLANT
STENT URET 6FRX24 CONTOUR (STENTS) IMPLANT
STENT URET 6FRX26 CONTOUR (STENTS) IMPLANT
SURGILUBE 2OZ TUBE FLIPTOP (MISCELLANEOUS) ×1 IMPLANT
WATER STERILE IRR 500ML POUR (IV SOLUTION) ×1 IMPLANT

## 2023-09-18 NOTE — Discharge Instructions (Addendum)
You have a ureteral stent in place.  This is a tube that extends from your kidney to your bladder.  This may cause urinary bleeding, burning with urination, and urinary frequency.  Please call our office or present to the ED if you develop fevers >101 or pain which is not able to be controlled with oral pain medications.  You may be given either Flomax and/ or ditropan to help with bladder spasms and stent pain in addition to pain medications.    Your stent will be staying for 4 weeks to allow your ureter to heal completely.  [facility] [address] [place], [place] [address] [phone]

## 2023-09-18 NOTE — Op Note (Signed)
Date of procedure: 09/18/23  Preoperative diagnosis:  Recurrent nephrolithiasis/left kidney stones  Postoperative diagnosis:  Same as above Left ureteral mucosal injury  Procedure: Left ureteroscopy Laser lithotripsy Left ureteral stent placement Retrograde pyelogram Interpretation of fluoroscopy less than 30 minutes  Surgeon: Vanna Scotland, MD  Anesthesia: General  Complications: None  Intraoperative findings: Stones embedded within multiple papilla in the upper pole moiety.  These were unroofed dislodged and dusted.  There is no additional stone burden appreciated.  Fluoroscopically, there is no additional stone burden at the end of the procedure.  There was minimal bleeding noted.  I shot a retrograde pyelogram to ensure that each and every calyx was directly visualized.  There was no contrast extravasation and no additional significant stone burden.  Upon backing the access sheath out, there was a ureteral mucosal injury, approximately 1 cm medially in the proximal ureter.  An additional retrograde under high pressure did reveal some contrast extravasation.  Stent was placed without difficulty.  EBL: Minimal  Drains: 6 x 24 French double-J ureteral stent  Indication: Joan Johnson is a 53 y.o. patient with nephrolithiasis with a fairly significant left upper pole stone burden.  After reviewing the management options for treatment, she/her/hers  elected to proceed with the above surgical procedure(s). We have discussed the potential benefits and risks of the procedure, side effects of the proposed treatment, the likelihood of the patient achieving the goals of the procedure, and any potential problems that might occur during the procedure or recuperation. Informed consent has been obtained.  Description of procedure:  The patient was taken to the operating room and general anesthesia was induced.  The patient was placed in the dorsal lithotomy position, prepped and draped in  the usual sterile fashion, and preoperative antibiotics were administered. A preoperative time-out was performed.   A 21 French scope was advanced per urethra into the bladder.  Attention was turned to the ureteral orifice.  A 5 French open-ended ureteral access sheath was used to advance a sensor wire up to the level of the kidney.  A dual-lumen ureteral access sheath was used just within the distal ureter.  A Super Stiff wire was then introduced all the way up to the level of the kidney through the second lumen of the catheter.  The sensor wire was snapped in place as a safety wire.  A ureteral access sheath, 12/14 [place] was advanced  to proximal ureter.  A dual-lumen digital ureteroscope was then advanced up to the level of the stone.  A 250 m laser fiber was then brought in using dusting settings of 0.3 J and 60 Hz, the stone was dusted.  Notably, all of the stones were located just under a thin layer of mucosa within each papilla of the upper pole.  Upon unroofing this, multiple additional stones were dislodged and treated.  There is a small cluster within each of the papilla, approximately 3-4 of these.  The scope was then advanced into the each of the calyces and additional stone burden was identified and dusted.  At the end of the procedure, no significant residual stone fragment remained greater than the size of the tip of the laser fiber.  A final retrograde pyelogram created roadmap to ensure that each every calyx was visualized and all significant stone burden was addressed.  There was no contrast extravasation initially..  The scope was then backed down the length of the ureter inspecting the ureteral integrity along the way.  Once the sheath was pulled  back in the proximal ureter, there was a 1 cm left medial proximal ureteral mucosal injury and a small amount of fat was seen in 1 small area.  I then shot a higher force retrograde distal to this area and there was some degree of small amount of  contrast extravasation.  The remainder of the ureter appeared to be unremarkable.  Finally, a 6 by 24 French ureteral access sheath was advanced over the safety wire up to the level of the kidney.  Upon wire removal, there was a full coil noted both within the renal pelvis as well as within the bladder.    The patient was then cleaned and dried, repositioned in supine position, reversed of anesthesia, taken to the PACU in stable condition.  Plan: Will plan to leave the stent at least 4 weeks in light of contrast extravasation in the ureter.  This is a small injury and should heal fairly quickly.  Procedure was otherwise unremarkable.  Vanna Scotland, M.D.

## 2023-09-18 NOTE — Anesthesia Procedure Notes (Signed)
Procedure Name: LMA Insertion Date/Time: 09/18/2023 9:08 AM  Performed by: Elisabeth Pigeon, CRNAPre-anesthesia Checklist: Patient identified, Emergency Drugs available, Suction available and Patient being monitored Patient Re-evaluated:Patient Re-evaluated prior to induction Oxygen Delivery Method: Circle system utilized Preoxygenation: Pre-oxygenation with 100% oxygen Induction Type: IV induction Ventilation: Mask ventilation without difficulty LMA: LMA inserted LMA Size: 3.0 Tube type: Oral Number of attempts: 1 Airway Equipment and Method: Oral airway Placement Confirmation: positive ETCO2 and breath sounds checked- equal and bilateral Tube secured with: Tape Dental Injury: Teeth and Oropharynx as per pre-operative assessment

## 2023-09-18 NOTE — H&P (Signed)
History of Present Illness: Joan Johnson is a 53 y.o. year old F who presents today for left ureteroscopy.  Most recent CT scan of the abdomen pelvis on 06/01/2023 shows a cluster of primarily left upper pole stones.   Past Medical History:  Diagnosis Date   Allergy    Anxiety    just when traveling   Arthritis    GERD (gastroesophageal reflux disease)    history   History of kidney stones    Hypertension    Left nephrolithiasis    Migraine    Osteopenia 06/2021   DEXA at [facility]; spine and hip   Pneumonia    Undifferentiated connective tissue disease (HCC)     Past Surgical History:  Procedure Laterality Date   ANKLE SURGERY     AUGMENTATION MAMMAPLASTY     Breast Implant   CESAREAN SECTION     1991, 1989, 1987   COLONOSCOPY WITH PROPOFOL N/A 10/01/2020   Procedure: COLONOSCOPY WITH PROPOFOL;  Surgeon: Toney Reil, MD;  Location: [facility] ENDOSCOPY;  Service: Gastroenterology;  Laterality: N/A;   COSMETIC SURGERY     CYSTOSCOPY W/ RETROGRADES Left 07/19/2021   Procedure: CYSTOSCOPY WITH RETROGRADE PYELOGRAM;  Surgeon: Vanna Scotland, MD;  Location: [facility] ORS;  Service: Urology;  Laterality: Left;   CYSTOSCOPY/URETEROSCOPY/HOLMIUM LASER/STENT PLACEMENT Left 07/19/2021   Procedure: CYSTOSCOPY/URETEROSCOPY/HOLMIUM LASER/STENT PLACEMENT;  Surgeon: Vanna Scotland, MD;  Location: [facility] ORS;  Service: Urology;  Laterality: Left;   dental implants     ESOPHAGOGASTRODUODENOSCOPY (EGD) WITH PROPOFOL N/A 10/01/2020   Procedure: ESOPHAGOGASTRODUODENOSCOPY (EGD) WITH PROPOFOL;  Surgeon: Toney Reil, MD;  Location: [facility] ENDOSCOPY;  Service: Gastroenterology;  Laterality: N/A;   HERNIA REPAIR     OTHER SURGICAL HISTORY     tummy tuck   OTHER SURGICAL HISTORY     carpal tunnel   TUBAL LIGATION      Home Medications:  Current Facility-Administered Medications for the 09/18/23 encounter [facility] Encounter)  Medication   cyanocobalamin (VITAMIN B12) injection  1,000 mcg   cyanocobalamin (VITAMIN B12) injection 1,000 mcg   Current Meds  Medication Sig   acetaminophen (TYLENOL) 500 MG tablet Take 1,000 mg by mouth every 6 (six) hours as needed for headache.   ALPRAZolam (XANAX) 0.25 MG tablet Take 1 tablet (0.25 mg total) by mouth 2 (two) times daily as needed for anxiety.   [EXPIRED] amoxicillin-clavulanate (AUGMENTIN) 875-125 MG tablet Take 1 tablet by mouth 2 (two) times daily for 10 days. Take with food   calcium carbonate (OSCAL) 1500 (600 Ca) MG TABS tablet Take 600 mg of elemental calcium by mouth daily.   chlorpheniramine-HYDROcodone (TUSSIONEX) 10-8 MG/5ML Take 5 mLs by mouth at bedtime as needed for cough.   EPINEPHrine 0.3 mg/0.3 mL IJ SOAJ injection Inject 0.3 mg into the muscle as needed for anaphylaxis.   Estradiol (IMVEXXY MAINTENANCE PACK) 10 MCG INST Insert 1 supp vaginally twice wkly   Estradiol Starter Pack (IMVEXXY STARTER PACK) 10 MCG INST Insert 1 supp vaginally nightly for 2 wks, then twice wkly   hydroxychloroquine (PLAQUENIL) 200 MG tablet Take 200 mg by mouth daily.   losartan (COZAAR) 50 MG tablet TAKE 1 TABLET(50 MG) BY MOUTH DAILY   Vitamin D, Ergocalciferol, (DRISDOL) 1.25 MG (50000 UNIT) CAPS capsule TAKE 1 CAPSULE BY MOUTH 1 TIME A WEEK    Allergies:  Allergies  Allergen Reactions   Codeine Itching and Other (See Comments)   Ivp Dye [Iodinated Contrast Media] Hives   Shellfish Allergy  Hives    Family History  Problem Relation Age of Onset   Heart murmur Mother    Thyroid disease Daughter    Breast cancer Neg Hx    Ovarian cancer Neg Hx     Social History:  reports that she quit smoking about 5 years ago. Her smoking use included cigarettes. She started smoking about 26 years ago. She has a 10.5 pack-year smoking history. She has been exposed to tobacco smoke. She has never used smokeless tobacco. She reports current alcohol use of about 2.0 standard drinks of alcohol per week. She reports that she does not  use drugs.  ROS: A complete review of systems was performed.  All systems are negative except for pertinent findings as noted.  Physical Exam:  Vital signs in last 24 hours: Temp:  [97.1 F (36.2 C)] 97.1 F (36.2 C) (02/17 0747) Pulse Rate:  [83] 83 (02/17 0747) Resp:  [18] 18 (02/17 0747) BP: (126)/(71) 126/71 (02/17 0747) SpO2:  [100 %] 100 % (02/17 0747) Weight:  [84.4 kg] 84.4 kg (02/17 0747) Constitutional:  Alert and oriented, No acute distress HEENT: [place] AT, moist mucus membranes.  Trachea midline, no masses Cardiovascular: Regular rate and rhythm, no clubbing, cyanosis, or edema. Respiratory: Normal respiratory effort, lungs clear bilaterally GI: Abdomen is soft, nontender, nondistended, no abdominal masses GU: No CVA tenderness Skin: No rashes, bruises or suspicious lesions Neurologic: Grossly intact, no focal deficits, moving all 4 extremities Psychiatric: Normal mood and affect   Laboratory Data:  Results for orders placed or performed in visit on 09/05/23  CULTURE, URINE COMPREHENSIVE     Status: None   Collection Time: 09/05/23 11:53 AM   Specimen: Urine   UR  Result Value Ref Range Status   Urine Culture, Comprehensive Final report  Final   Organism ID, Bacteria Comment  Final    Comment: No growth in 36 - 48 hours.  Microscopic Examination     Status: Abnormal   Collection Time: 09/05/23 11:53 AM   Urine  Result Value Ref Range Status   WBC, UA 0-5 0 - 5 /hpf Final   RBC, Urine 0-2 0 - 2 /hpf Final   Epithelial Cells (non renal) 0-10 0 - 10 /hpf Final   Casts Present (A) None seen /lpf Final   Cast Type Granular casts (A) N/A Final   Mucus, UA Present (A) Not Estab. Final   Bacteria, UA Moderate (A) None seen/Few Final     Radiologic Imaging: No results found.  Impression/Assessment:   1. Left nephrolithiasis She does have a fairly sizable left upper pole significant stone burden intermittently symptomatic.  There are multiple smaller stones in  the upper pole and suspect she passes these intervally.   We discussed the option of returning back to the operating room for right ureteroscopy, laser lithotripsy and stent placement.  We discussed the risk of the procedure including risk of injury to the ureter, damage surrounding structures, urinary tract infection, stent pain, need for multiple procedures, amongst others.  All questions were answered.  She has had a procedure before and would like to proceed as planned.   Would encourage 24-hour urine metabolic analysis down the road, has been offered but deferred this in the past.  This will facilitate stone prevention.  She is on board with this today.    09/18/2023, 8:31 AM  Vanna Scotland,  MD

## 2023-09-18 NOTE — Anesthesia Preprocedure Evaluation (Signed)
Anesthesia Evaluation  Patient identified by MRN, date of birth, ID band Patient awake    Reviewed: Allergy & Precautions, NPO status , Patient's Chart, lab work & pertinent test results  History of Anesthesia Complications Negative for: history of anesthetic complications  Airway Mallampati: II  TM Distance: >3 FB Neck ROM: Full    Dental no notable dental hx. (+) Teeth Intact   Pulmonary neg shortness of breath, neg sleep apnea, neg COPD, Recent URI , Residual Cough, Patient abstained from smoking.Not current smoker, former smoker 3 weeks ago patient had body aches/chills/cough/sore throat. Negative for strep and flu at that time. Given abx anyway.   That acute episode resolved about 2.5 weeks ago, but patient has had a residual cough, what sounds to be post-viral. No other symptoms other than cough mildly productive of sputum. Denies SOB/fever.   breath sounds clear to auscultation       Cardiovascular Exercise Tolerance: Good METShypertension, Pt. on medications (-) CAD and (-) Past MI (-) dysrhythmias  Rhythm:Regular Rate:Normal - Systolic murmurs    Neuro/Psych  Headaches PSYCHIATRIC DISORDERS Anxiety        GI/Hepatic ,GERD  ,,(+)     (-) substance abuse    Endo/Other  neg diabetes    Renal/GU negative Renal ROS     Musculoskeletal   Abdominal   Peds  Hematology   Anesthesia Other Findings Past Medical History: No date: Allergy No date: Anxiety     Comment:  just when traveling No date: Arthritis No date: GERD (gastroesophageal reflux disease)     Comment:  history No date: History of kidney stones No date: Hypertension No date: Left nephrolithiasis No date: Migraine 06/2021: Osteopenia     Comment:  DEXA at [facility]; spine and hip No date: Pneumonia No date: Undifferentiated connective tissue disease (HCC)  Reproductive/Obstetrics                             Anesthesia  Physical Anesthesia Plan  ASA: 2  Anesthesia Plan: General   Post-op Pain Management: Ofirmev IV (intra-op)* and Toradol IV (intra-op)*   Induction: Intravenous  PONV Risk Score and Plan: 4 or greater and Ondansetron, Dexamethasone, Midazolam and Treatment may vary due to age or medical condition  Airway Management Planned: LMA and Oral ETT  Additional Equipment: None  Intra-op Plan:   Post-operative Plan: Extubation in OR  Informed Consent: I have reviewed the patients History and Physical, chart, labs and discussed the procedure including the risks, benefits and alternatives for the proposed anesthesia with the patient or authorized representative who has indicated his/her understanding and acceptance.     Dental advisory given  Plan Discussed with: CRNA and Surgeon  Anesthesia Plan Comments: (Discussed in great detail patient's residual cough and recent URI hx. Given that patient only has a rare cough in preop with no other systemic symptoms, lungs CTAB,  and is 2.5 weeks out from resolution of her URI, I did advise it was likely not unreasonable/unsafe to proceed today, but ultimately gave her the choice to proceed or not; she wishes to proceed, understanding potential increased respiratory risk. Discussed risks of anesthesia with patient, including PONV, sore throat, lip/dental/eye damage. Rare risks discussed as well, such as cardiorespiratory and neurological sequelae, and allergic reactions. Discussed the role of CRNA in patient's perioperative care. Patient understands.  LMA vs GETA depending on surgeon requests.)        Anesthesia Quick Evaluation

## 2023-09-18 NOTE — Transfer of Care (Addendum)
Immediate Anesthesia Transfer of Care Note  Patient: Joan Johnson  Procedure(s) Performed: CYSTOSCOPY/URETEROSCOPY/HOLMIUM LASER/STENT PLACEMENT (Left)  Patient Location: PACU  Anesthesia Type:General  Level of Consciousness: awake  Airway & Oxygen Therapy: Patient Spontanous Breathing and Patient connected to face mask oxygen  Post-op Assessment: Report given to RN and Post -op Vital signs reviewed and stable  Post vital signs: Reviewed and stable  Last Vitals:  Vitals Value Taken Time  BP 131/77 09/18/23 0948  Temp 36.1 C 09/18/23 0948  Pulse 76 09/18/23 0955  Resp 16 09/18/23 0955  SpO2 100 % 09/18/23 0955  Vitals shown include unfiled device data.  Last Pain:  Vitals:   09/18/23 0948  TempSrc:   PainSc: Asleep         Complications: No notable events documented.

## 2023-09-18 NOTE — Addendum Note (Signed)
Addendum  created 09/18/23 1048 by Elisabeth Pigeon, CRNA   Intraprocedure Meds edited

## 2023-09-18 NOTE — Telephone Encounter (Signed)
 Error

## 2023-09-18 NOTE — Anesthesia Postprocedure Evaluation (Signed)
Anesthesia Post Note  Patient: Joan Johnson  Procedure(s) Performed: CYSTOSCOPY/URETEROSCOPY/HOLMIUM LASER/STENT PLACEMENT (Left)  Patient location during evaluation: PACU Anesthesia Type: General Level of consciousness: awake and alert Pain management: pain level controlled Vital Signs Assessment: post-procedure vital signs reviewed and stable Respiratory status: spontaneous breathing, nonlabored ventilation, respiratory function stable and patient connected to nasal cannula oxygen Cardiovascular status: blood pressure returned to baseline and stable Postop Assessment: no apparent nausea or vomiting Anesthetic complications: no   No notable events documented.   Last Vitals:  Vitals:   09/18/23 1015 09/18/23 1030  BP: 107/70 116/76  Pulse: 73 60  Resp: 15 14  Temp: (!) 36.1 C (!) 36.2 C  SpO2: 100% 99%    Last Pain:  Vitals:   09/18/23 1030  TempSrc:   PainSc: 4                  Corinda Gubler

## 2023-09-19 ENCOUNTER — Telehealth: Payer: Self-pay

## 2023-09-19 ENCOUNTER — Encounter: Payer: Self-pay | Admitting: Urology

## 2023-09-19 ENCOUNTER — Inpatient Hospital Stay
Admission: EM | Admit: 2023-09-19 | Discharge: 2023-09-22 | DRG: 660 | Disposition: A | Discharge status: Home / Self Care | Payer: Commercial Managed Care - PPO | Attending: Internal Medicine | Admitting: Internal Medicine

## 2023-09-19 ENCOUNTER — Emergency Department: Payer: Commercial Managed Care - PPO

## 2023-09-19 ENCOUNTER — Other Ambulatory Visit: Payer: Self-pay

## 2023-09-19 DIAGNOSIS — N390 Urinary tract infection, site not specified: Secondary | ICD-10-CM

## 2023-09-19 DIAGNOSIS — Z91041 Radiographic dye allergy status: Secondary | ICD-10-CM

## 2023-09-19 DIAGNOSIS — R112 Nausea with vomiting, unspecified: Principal | ICD-10-CM

## 2023-09-19 DIAGNOSIS — N9989 Other postprocedural complications and disorders of genitourinary system: Principal | ICD-10-CM | POA: Diagnosis present

## 2023-09-19 DIAGNOSIS — Z6832 Body mass index (BMI) 32.0-32.9, adult: Secondary | ICD-10-CM

## 2023-09-19 DIAGNOSIS — Z885 Allergy status to narcotic agent status: Secondary | ICD-10-CM

## 2023-09-19 DIAGNOSIS — N12 Tubulo-interstitial nephritis, not specified as acute or chronic: Secondary | ICD-10-CM

## 2023-09-19 DIAGNOSIS — M059 Rheumatoid arthritis with rheumatoid factor, unspecified: Secondary | ICD-10-CM | POA: Diagnosis present

## 2023-09-19 DIAGNOSIS — N200 Calculus of kidney: Secondary | ICD-10-CM | POA: Diagnosis present

## 2023-09-19 DIAGNOSIS — K219 Gastro-esophageal reflux disease without esophagitis: Secondary | ICD-10-CM | POA: Diagnosis present

## 2023-09-19 DIAGNOSIS — Z91013 Allergy to seafood: Secondary | ICD-10-CM

## 2023-09-19 DIAGNOSIS — I10 Essential (primary) hypertension: Secondary | ICD-10-CM | POA: Diagnosis present

## 2023-09-19 DIAGNOSIS — Y848 Other medical procedures as the cause of abnormal reaction of the patient, or of later complication, without mention of misadventure at the time of the procedure: Secondary | ICD-10-CM | POA: Diagnosis present

## 2023-09-19 DIAGNOSIS — Z87891 Personal history of nicotine dependence: Secondary | ICD-10-CM

## 2023-09-19 DIAGNOSIS — Z960 Presence of urogenital implants: Secondary | ICD-10-CM

## 2023-09-19 DIAGNOSIS — E669 Obesity, unspecified: Secondary | ICD-10-CM | POA: Diagnosis present

## 2023-09-19 DIAGNOSIS — Z1612 Extended spectrum beta lactamase (ESBL) resistance: Secondary | ICD-10-CM | POA: Diagnosis present

## 2023-09-19 DIAGNOSIS — B9620 Unspecified Escherichia coli [E. coli] as the cause of diseases classified elsewhere: Secondary | ICD-10-CM | POA: Diagnosis present

## 2023-09-19 DIAGNOSIS — Z79899 Other long term (current) drug therapy: Secondary | ICD-10-CM

## 2023-09-19 DIAGNOSIS — K5900 Constipation, unspecified: Secondary | ICD-10-CM | POA: Diagnosis present

## 2023-09-19 LAB — CBC
HCT: 37.4 % (ref 36.0–46.0)
Hemoglobin: 12.5 g/dL (ref 12.0–15.0)
MCH: 31.3 pg (ref 26.0–34.0)
MCHC: 33.4 g/dL (ref 30.0–36.0)
MCV: 93.7 fL (ref 80.0–100.0)
Platelets: 349 10*3/uL (ref 150–400)
RBC: 3.99 MIL/uL (ref 3.87–5.11)
RDW: 13.2 % (ref 11.5–15.5)
WBC: 16.3 10*3/uL — ABNORMAL HIGH (ref 4.0–10.5)
nRBC: 0 % (ref 0.0–0.2)

## 2023-09-19 LAB — BASIC METABOLIC PANEL
Anion gap: 9 (ref 5–15)
BUN: 13 mg/dL (ref 6–20)
CO2: 25 mmol/L (ref 22–32)
Calcium: 9.3 mg/dL (ref 8.9–10.3)
Chloride: 107 mmol/L (ref 98–111)
Creatinine, Ser: 0.81 mg/dL (ref 0.44–1.00)
GFR, Estimated: >60 mL/min (ref >60)
Glucose, Bld: 120 mg/dL — ABNORMAL HIGH (ref 70–99)
Potassium: 4 mmol/L (ref 3.5–5.1)
Sodium: 141 mmol/L (ref 135–145)

## 2023-09-19 MED ORDER — ONDANSETRON 4 MG PO TBDP
4.0000 mg | ORAL_TABLET | Freq: Once | ORAL | Status: AC
  Administered 2023-09-19: 4 mg via ORAL
  Filled 2023-09-19: qty 1

## 2023-09-19 MED ORDER — FENTANYL CITRATE PF 50 MCG/ML IJ SOSY
25.0000 ug | PREFILLED_SYRINGE | Freq: Once | INTRAMUSCULAR | Status: AC
  Administered 2023-09-19: 25 ug via INTRAMUSCULAR
  Filled 2023-09-19: qty 1

## 2023-09-19 NOTE — ED Triage Notes (Signed)
Pt to ED with husband for L flank pain. Had lithotripsy yesterday with stent placement to same side yesterday. Has vomited 2 times today. Is urinating normally (with hematuria). Post op pain meds not working.   Pt last took oxybutynin and Vicoden around 1pm today.

## 2023-09-19 NOTE — ED Provider Triage Note (Signed)
Emergency Medicine Provider Triage Evaluation Note  Joan Johnson , a 53 y.o. female  was evaluated in triage.  Pt complains of left-sided flank pain, lithotripsy done yesterday, pain meds not helping, is urinating but has blood in the urine.  Review of Systems  Positive:  Negative:   Physical Exam  There were no vitals taken for this visit. Gen:   Awake, no distress   Resp:  Normal effort  MSK:   Moves extremities without difficulty  Other:    Medical Decision Making  Medically screening exam initiated at 5:35 PM.  Appropriate orders placed.  Joan Johnson was informed that the remainder of the evaluation will be completed by another provider, this initial triage assessment does not replace that evaluation, and the importance of remaining in the ED until their evaluation is complete.  Kub ordered Zofran and fentanyl Im given   Faythe Ghee, PA-C 09/19/23 1740

## 2023-09-19 NOTE — Telephone Encounter (Signed)
Pt called the triage line stating she has questions about her meds after surgery. LVM for pt to return call

## 2023-09-20 ENCOUNTER — Encounter: Payer: Self-pay | Admitting: Internal Medicine

## 2023-09-20 DIAGNOSIS — Z79899 Other long term (current) drug therapy: Secondary | ICD-10-CM | POA: Diagnosis not present

## 2023-09-20 DIAGNOSIS — Z960 Presence of urogenital implants: Secondary | ICD-10-CM

## 2023-09-20 DIAGNOSIS — N200 Calculus of kidney: Secondary | ICD-10-CM | POA: Diagnosis present

## 2023-09-20 DIAGNOSIS — N9989 Other postprocedural complications and disorders of genitourinary system: Secondary | ICD-10-CM | POA: Diagnosis present

## 2023-09-20 DIAGNOSIS — Z91041 Radiographic dye allergy status: Secondary | ICD-10-CM | POA: Diagnosis not present

## 2023-09-20 DIAGNOSIS — N390 Urinary tract infection, site not specified: Secondary | ICD-10-CM | POA: Diagnosis present

## 2023-09-20 DIAGNOSIS — Z1612 Extended spectrum beta lactamase (ESBL) resistance: Secondary | ICD-10-CM | POA: Diagnosis present

## 2023-09-20 DIAGNOSIS — Z91013 Allergy to seafood: Secondary | ICD-10-CM | POA: Diagnosis not present

## 2023-09-20 DIAGNOSIS — K219 Gastro-esophageal reflux disease without esophagitis: Secondary | ICD-10-CM | POA: Diagnosis present

## 2023-09-20 DIAGNOSIS — I10 Essential (primary) hypertension: Secondary | ICD-10-CM | POA: Diagnosis present

## 2023-09-20 DIAGNOSIS — Z885 Allergy status to narcotic agent status: Secondary | ICD-10-CM | POA: Diagnosis not present

## 2023-09-20 DIAGNOSIS — E669 Obesity, unspecified: Secondary | ICD-10-CM | POA: Diagnosis present

## 2023-09-20 DIAGNOSIS — M059 Rheumatoid arthritis with rheumatoid factor, unspecified: Secondary | ICD-10-CM | POA: Diagnosis present

## 2023-09-20 DIAGNOSIS — K5900 Constipation, unspecified: Secondary | ICD-10-CM | POA: Diagnosis present

## 2023-09-20 DIAGNOSIS — Y848 Other medical procedures as the cause of abnormal reaction of the patient, or of later complication, without mention of misadventure at the time of the procedure: Secondary | ICD-10-CM | POA: Diagnosis present

## 2023-09-20 DIAGNOSIS — Z6832 Body mass index (BMI) 32.0-32.9, adult: Secondary | ICD-10-CM | POA: Diagnosis not present

## 2023-09-20 DIAGNOSIS — Z87891 Personal history of nicotine dependence: Secondary | ICD-10-CM | POA: Diagnosis not present

## 2023-09-20 DIAGNOSIS — B9620 Unspecified Escherichia coli [E. coli] as the cause of diseases classified elsewhere: Secondary | ICD-10-CM | POA: Diagnosis present

## 2023-09-20 LAB — URINALYSIS, ROUTINE W REFLEX MICROSCOPIC
Bilirubin Urine: NEGATIVE
Glucose, UA: NEGATIVE mg/dL
Ketones, ur: NEGATIVE mg/dL
Nitrite: NEGATIVE
Protein, ur: 100 mg/dL — AB
RBC / HPF: >50 RBC/hpf (ref 0–5)
Specific Gravity, Urine: 1.015 (ref 1.005–1.030)
pH: 6 (ref 5.0–8.0)

## 2023-09-20 LAB — HIV ANTIBODY (ROUTINE TESTING W REFLEX): HIV Screen 4th Generation wRfx: NONREACTIVE

## 2023-09-20 MED ORDER — ONDANSETRON HCL 4 MG PO TABS: 4.0000 mg | ORAL_TABLET | Freq: Four times a day (QID) | ORAL | Status: DC | PRN

## 2023-09-20 MED ORDER — LOSARTAN POTASSIUM 50 MG PO TABS
50.0000 mg | ORAL_TABLET | Freq: Every day | ORAL | Status: DC
  Administered 2023-09-20 – 2023-09-22 (×3): 50 mg via ORAL
  Filled 2023-09-20 (×3): qty 1

## 2023-09-20 MED ORDER — OXYCODONE HCL 5 MG PO TABS
5.0000 mg | ORAL_TABLET | ORAL | Status: DC | PRN
  Administered 2023-09-20 – 2023-09-22 (×6): 5 mg via ORAL
  Filled 2023-09-20 (×7): qty 1

## 2023-09-20 MED ORDER — LACTATED RINGERS IV BOLUS
1000.0000 mL | Freq: Once | INTRAVENOUS | Status: AC
  Administered 2023-09-20: 1000 mL via INTRAVENOUS

## 2023-09-20 MED ORDER — MORPHINE SULFATE (PF) 4 MG/ML IV SOLN
4.0000 mg | Freq: Once | INTRAVENOUS | Status: AC
  Administered 2023-09-20: 4 mg via INTRAVENOUS
  Filled 2023-09-20: qty 1

## 2023-09-20 MED ORDER — ONDANSETRON HCL 4 MG/2ML IJ SOLN
4.0000 mg | Freq: Once | INTRAMUSCULAR | Status: AC
  Administered 2023-09-20: 4 mg via INTRAVENOUS
  Filled 2023-09-20: qty 2

## 2023-09-20 MED ORDER — ORAL CARE MOUTH RINSE: 15.0000 mL | OROMUCOSAL | Status: DC | PRN

## 2023-09-20 MED ORDER — ONDANSETRON HCL 4 MG/2ML IJ SOLN: 4.0000 mg | Freq: Four times a day (QID) | INTRAMUSCULAR | Status: DC | PRN

## 2023-09-20 MED ORDER — DIPHENHYDRAMINE HCL 25 MG PO CAPS
25.0000 mg | ORAL_CAPSULE | Freq: Four times a day (QID) | ORAL | Status: DC | PRN
  Administered 2023-09-20 – 2023-09-22 (×6): 25 mg via ORAL
  Filled 2023-09-20 (×6): qty 1

## 2023-09-20 MED ORDER — ACETAMINOPHEN 325 MG PO TABS: 650.0000 mg | ORAL_TABLET | Freq: Four times a day (QID) | ORAL | Status: DC | PRN

## 2023-09-20 MED ORDER — ENOXAPARIN SODIUM 40 MG/0.4ML IJ SOSY
40.0000 mg | PREFILLED_SYRINGE | INTRAMUSCULAR | Status: DC
  Administered 2023-09-20 – 2023-09-22 (×3): 40 mg via SUBCUTANEOUS
  Filled 2023-09-20 (×3): qty 0.4

## 2023-09-20 MED ORDER — ACETAMINOPHEN 650 MG RE SUPP: 650.0000 mg | Freq: Four times a day (QID) | RECTAL | Status: DC | PRN

## 2023-09-20 MED ORDER — TAMSULOSIN HCL 0.4 MG PO CAPS
0.4000 mg | ORAL_CAPSULE | Freq: Every day | ORAL | Status: DC
  Administered 2023-09-20 – 2023-09-22 (×3): 0.4 mg via ORAL
  Filled 2023-09-20 (×3): qty 1

## 2023-09-20 MED ORDER — ALPRAZOLAM 0.25 MG PO TABS: 0.2500 mg | ORAL_TABLET | Freq: Two times a day (BID) | ORAL | Status: DC | PRN

## 2023-09-20 MED ORDER — KETOROLAC TROMETHAMINE 30 MG/ML IJ SOLN
30.0000 mg | Freq: Four times a day (QID) | INTRAMUSCULAR | Status: DC | PRN
  Administered 2023-09-20: 30 mg via INTRAVENOUS
  Filled 2023-09-20 (×2): qty 1

## 2023-09-20 MED ORDER — SODIUM CHLORIDE 0.9 % IV SOLN
1.0000 g | INTRAVENOUS | Status: DC
  Administered 2023-09-20 – 2023-09-21 (×2): 1 g via INTRAVENOUS
  Filled 2023-09-20 (×2): qty 10

## 2023-09-20 MED ORDER — MORPHINE SULFATE (PF) 2 MG/ML IV SOLN
2.0000 mg | INTRAVENOUS | Status: DC | PRN
  Administered 2023-09-20: 2 mg via INTRAVENOUS
  Filled 2023-09-20: qty 1

## 2023-09-20 MED ORDER — SODIUM CHLORIDE 0.9 % IV SOLN
1.0000 g | Freq: Once | INTRAVENOUS | Status: AC
  Administered 2023-09-20: 1 g via INTRAVENOUS
  Filled 2023-09-20: qty 10

## 2023-09-20 NOTE — Progress Notes (Signed)
PROGRESS NOTE    Joan Johnson  VWU:[id number] DOB: [birth year] DOA: 09/19/2023 PCP: Sallyanne Kuster, NP  Assessment & Plan:   Principal Problem:   Complicated UTI (urinary tract infection) Active Problems:   S/P lithotripsy cystoscopy with ureteral stent placement   Hypertension   Rheumatoid arthritis with positive rheumatoid factor (HCC)  Assessment and Plan: Complicated UTI: s/p lithotripsy and stent placement in 09/18/23. Urine cx is pending. Continue on IV rocephin. Uro following and recs apprec    RA: hold hydroxychloroquine    HTN: continue on losartan   BPH: continue on tamsulosin    DVT prophylaxis: lovenox  Code Status: full  Family Communication: Disposition Plan: likely d/c home.  Level of care: Med-Surg  Status is: Inpatient Remains inpatient appropriate because: requiring IV abxs    Consultants:    Procedures:  Antimicrobials: rocephin    Subjective: Pt c/o nausea   Objective: Vitals:   09/19/23 1734 09/19/23 1736 09/20/23 0035 09/20/23 0525  BP: 125/80  122/67   Pulse: 72  89   Resp: 20  18   Temp: 98.3 F (36.8 C)   98.2 F (36.8 C)  TempSrc: Oral   Oral  SpO2: 98%  100%   Weight:  83.9 kg    Height:  5\' 3"  (1.6 m)      Intake/Output Summary (Last 24 hours) at 09/20/2023 0902 Last data filed at 09/20/2023 0335 Gross per 24 hour  Intake 100 ml  Output --  Net 100 ml   Filed Weights   09/19/23 1736  Weight: 83.9 kg    Examination:  General exam: Appears calm and comfortable  Respiratory system: Clear to auscultation. Respiratory effort normal. Cardiovascular system: S1 & S2+. No rubs, gallops or clicks.  Gastrointestinal system: Abdomen is nondistended, soft and nontender. Normal bowel sounds heard. Central nervous system: Alert and oriented. Moves all extremities  Psychiatry: Judgement and insight appear normal. Mood & affect appropriate.     Data Reviewed: I have personally reviewed following labs and imaging  studies  CBC: Recent Labs  Lab 09/19/23 1739  WBC 16.3*  HGB 12.5  HCT 37.4  MCV 93.7  PLT 349   Basic Metabolic Panel: Recent Labs  Lab 09/19/23 1739  NA 141  K 4.0  CL 107  CO2 25  GLUCOSE 120*  BUN 13  CREATININE 0.81  CALCIUM 9.3   GFR: Estimated Creatinine Clearance: 82.4 mL/min (by C-G formula based on SCr of 0.81 mg/dL). Liver Function Tests: No results for input(s): "AST", "ALT", "ALKPHOS", "BILITOT", "PROT", "ALBUMIN" in the last 168 hours. No results for input(s): "LIPASE", "AMYLASE" in the last 168 hours. No results for input(s): "AMMONIA" in the last 168 hours. Coagulation Profile: No results for input(s): "INR", "PROTIME" in the last 168 hours. Cardiac Enzymes: No results for input(s): "CKTOTAL", "CKMB", "CKMBINDEX", "TROPONINI" in the last 168 hours. BNP (last 3 results) No results for input(s): "PROBNP" in the last 8760 hours. HbA1C: No results for input(s): "HGBA1C" in the last 72 hours. CBG: No results for input(s): "GLUCAP" in the last 168 hours. Lipid Profile: No results for input(s): "CHOL", "HDL", "LDLCALC", "TRIG", "CHOLHDL", "LDLDIRECT" in the last 72 hours. Thyroid Function Tests: No results for input(s): "TSH", "T4TOTAL", "FREET4", "T3FREE", "THYROIDAB" in the last 72 hours. Anemia Panel: No results for input(s): "VITAMINB12", "FOLATE", "FERRITIN", "TIBC", "IRON", "RETICCTPCT" in the last 72 hours. Sepsis Labs: No results for input(s): "PROCALCITON", "LATICACIDVEN" in the last 168 hours.  No results found for this or any previous visit (from  the past 240 hours).       Radiology Studies: DG Abdomen 1 View Result Date: 09/19/2023 CLINICAL DATA:  Lithotripsy.  Abdominal pain. EXAM: ABDOMEN - 1 VIEW COMPARISON:  Abdominal radiograph dated 06/05/2023. CT dated 06/01/2023. FINDINGS: Left pigtail ureteral catheter with proximal tip over the left flank and distal end over the urinary bladder. No radiopaque calculi noted along the course of  the catheter. Faint punctate radiopaque foci superior to the catheter in the left flank likely represent stones in the upper pole of the left kidney. No bowel dilatation. Moderate stool in the colon. No free air. The osseous structures are intact. IMPRESSION: 1. Left pigtail ureteral catheter in place. No radiopaque calculi noted along the course of the catheter. 2. Probable stones in the upper pole of the left kidney. Electronically Signed   By: Elgie Collard M.D.   On: 09/19/2023 19:13        Scheduled Meds:  cyanocobalamin  1,000 mcg Intramuscular Once   cyanocobalamin  1,000 mcg Intramuscular Once   enoxaparin (LOVENOX) injection  40 mg Subcutaneous Q24H   losartan  50 mg Oral Daily   tamsulosin  0.4 mg Oral Daily   Continuous Infusions:  cefTRIAXone (ROCEPHIN)  IV       LOS: 0 days      Charise Killian, MD [facility] Pager [phone]  If 7PM-7AM, please contact night-coverage [email or web address] 09/20/2023, 9:02 AM

## 2023-09-20 NOTE — H&P (Signed)
History and Physical    Patient: Joan Johnson ZOX:[id number] DOB: 1970/08/08 DOA: 09/19/2023 DOS: the patient was seen and examined on 09/20/2023 PCP: Sallyanne Kuster, NP  Patient coming from: Home  Chief Complaint:  Chief Complaint  Patient presents with   Flank Pain    HPI: Joan Johnson is a 53 y.o. female with medical history significant for HTN, undifferentiated connective tissue disease, anxiety, nephrolithiasis s/p lithotripsy and stent placement 2 days prior who presents to the ED with intractable nausea vomiting and pain as well as fevers at home. ED course and data review: Afebrile here vitals normal. Labs notable for WBC 16,000 and UA consistent with UTI.  The EDP  spoke with urology and they advised on antibiotics, no intervention needed.Will consult Started on rocephin and hospitalist consulted for admission     Past Medical History:  Diagnosis Date   Allergy    Anxiety    just when traveling   Arthritis    GERD (gastroesophageal reflux disease)    history   History of kidney stones    Hypertension    Left nephrolithiasis    Migraine    Osteopenia 06/2021   DEXA at [facility]; spine and hip   Pneumonia    Undifferentiated connective tissue disease (HCC)    Past Surgical History:  Procedure Laterality Date   ANKLE SURGERY     AUGMENTATION MAMMAPLASTY     Breast Implant   CESAREAN SECTION     1991, 1989, 1987   COLONOSCOPY WITH PROPOFOL N/A 10/01/2020   Procedure: COLONOSCOPY WITH PROPOFOL;  Surgeon: Toney Reil, MD;  Location: [facility] ENDOSCOPY;  Service: Gastroenterology;  Laterality: N/A;   COSMETIC SURGERY     CYSTOSCOPY W/ RETROGRADES Left 07/19/2021   Procedure: CYSTOSCOPY WITH RETROGRADE PYELOGRAM;  Surgeon: Vanna Scotland, MD;  Location: [facility] ORS;  Service: Urology;  Laterality: Left;   CYSTOSCOPY/URETEROSCOPY/HOLMIUM LASER/STENT PLACEMENT Left 07/19/2021   Procedure: CYSTOSCOPY/URETEROSCOPY/HOLMIUM LASER/STENT PLACEMENT;  Surgeon:  Vanna Scotland, MD;  Location: [facility] ORS;  Service: Urology;  Laterality: Left;   CYSTOSCOPY/URETEROSCOPY/HOLMIUM LASER/STENT PLACEMENT Left 09/18/2023   Procedure: CYSTOSCOPY/URETEROSCOPY/HOLMIUM LASER/STENT PLACEMENT;  Surgeon: Vanna Scotland, MD;  Location: [facility] ORS;  Service: Urology;  Laterality: Left;   dental implants     ESOPHAGOGASTRODUODENOSCOPY (EGD) WITH PROPOFOL N/A 10/01/2020   Procedure: ESOPHAGOGASTRODUODENOSCOPY (EGD) WITH PROPOFOL;  Surgeon: Toney Reil, MD;  Location: [facility] ENDOSCOPY;  Service: Gastroenterology;  Laterality: N/A;   HERNIA REPAIR     OTHER SURGICAL HISTORY     tummy tuck   OTHER SURGICAL HISTORY     carpal tunnel   TUBAL LIGATION     Social History:  reports that she quit smoking about 5 years ago. Her smoking use included cigarettes. She started smoking about 26 years ago. She has a 10.5 pack-year smoking history. She has been exposed to tobacco smoke. She has never used smokeless tobacco. She reports current alcohol use of about 2.0 standard drinks of alcohol per week. She reports that she does not use drugs.  Allergies  Allergen Reactions   Codeine Itching and Other (See Comments)   Ivp Dye [Iodinated Contrast Media] Hives   Shellfish Allergy Hives    Family History  Problem Relation Age of Onset   Heart murmur Mother    Thyroid disease Daughter    Breast cancer Neg Hx    Ovarian cancer Neg Hx     Prior to Admission medications   Medication Sig Start Date End Date Taking? Authorizing Provider  acetaminophen (TYLENOL) 500 MG  tablet Take 1,000 mg by mouth every 6 (six) hours as needed for headache.    [provider]  ALPRAZolam Prudy Feeler) 0.25 MG tablet Take 1 tablet (0.25 mg total) by mouth 2 (two) times daily as needed for anxiety. 04/06/22   Alfredia Ferguson, PA-C  calcium carbonate (OSCAL) 1500 (600 Ca) MG TABS tablet Take 600 mg of elemental calcium by mouth daily.    [provider]  chlorpheniramine-HYDROcodone  (TUSSIONEX) 10-8 MG/5ML Take 5 mLs by mouth at bedtime as needed for cough. 08/30/23   Sallyanne Kuster, NP  EPINEPHrine 0.3 mg/0.3 mL IJ SOAJ injection Inject 0.3 mg into the muscle as needed for anaphylaxis. 04/06/22   Alfredia Ferguson, PA-C  Estradiol [facility] MAINTENANCE PACK) 10 MCG INST Insert 1 supp vaginally twice wkly 09/05/23   Copland, Ilona Sorrel, PA-C  Estradiol Starter Pack (IMVEXXY STARTER PACK) 10 MCG INST Insert 1 supp vaginally nightly for 2 wks, then twice wkly 09/05/23   Copland, Alicia B, PA-C  HYDROcodone-acetaminophen (NORCO/VICODIN) 5-325 MG tablet Take 1-2 tablets by mouth every 6 (six) hours as needed for moderate pain (pain score 4-6). 09/18/23   Vanna Scotland, MD  hydroxychloroquine (PLAQUENIL) 200 MG tablet Take 200 mg by mouth daily. 02/19/21   [provider]  losartan (COZAAR) 50 MG tablet TAKE 1 TABLET(50 MG) BY MOUTH DAILY 12/12/22   Drubel, Lillia Abed, PA-C  oxybutynin (DITROPAN) 5 MG tablet Take 1 tablet (5 mg total) by mouth every 8 (eight) hours as needed for bladder spasms. 09/18/23   Vanna Scotland, MD  tamsulosin (FLOMAX) 0.4 MG CAPS capsule Take 1 capsule (0.4 mg total) by mouth daily. 09/18/23   Vanna Scotland, MD  Vitamin D, Ergocalciferol, (DRISDOL) 1.25 MG (50000 UNIT) CAPS capsule TAKE 1 CAPSULE BY MOUTH 1 TIME A WEEK 09/04/23   Sallyanne Kuster, NP    Physical Exam: Vitals:   09/19/23 1734 09/19/23 1736 09/20/23 0035  BP: 125/80  122/67  Pulse: 72  89  Resp: 20  18  Temp: 98.3 F (36.8 C)    TempSrc: Oral    SpO2: 98%  100%  Weight:  83.9 kg   Height:  5\' 3"  (1.6 m)    Physical Exam Vitals and nursing note reviewed.  Constitutional:      General: She is not in acute distress. HENT:     Head: Normocephalic and atraumatic.  Cardiovascular:     Rate and Rhythm: Normal rate and regular rhythm.     Heart sounds: Normal heart sounds.  Pulmonary:     Effort: Pulmonary effort is normal.     Breath sounds: Normal breath sounds.  Abdominal:      Palpations: Abdomen is soft.     Tenderness: There is no abdominal tenderness.     Comments: Left flank pain  Neurological:     Mental Status: Mental status is at baseline.     Labs on Admission: I have personally reviewed following labs and imaging studies  CBC: Recent Labs  Lab 09/19/23 1739  WBC 16.3*  HGB 12.5  HCT 37.4  MCV 93.7  PLT 349   Basic Metabolic Panel: Recent Labs  Lab 09/19/23 1739  NA 141  K 4.0  CL 107  CO2 25  GLUCOSE 120*  BUN 13  CREATININE 0.81  CALCIUM 9.3   GFR: Estimated Creatinine Clearance: 82.4 mL/min (by C-G formula based on SCr of 0.81 mg/dL). Liver Function Tests: No results for input(s): "AST", "ALT", "ALKPHOS", "BILITOT", "PROT", "ALBUMIN" in the last 168 hours. No  results for input(s): "LIPASE", "AMYLASE" in the last 168 hours. No results for input(s): "AMMONIA" in the last 168 hours. Coagulation Profile: No results for input(s): "INR", "PROTIME" in the last 168 hours. Cardiac Enzymes: No results for input(s): "CKTOTAL", "CKMB", "CKMBINDEX", "TROPONINI" in the last 168 hours. BNP (last 3 results) No results for input(s): "PROBNP" in the last 8760 hours. HbA1C: No results for input(s): "HGBA1C" in the last 72 hours. CBG: No results for input(s): "GLUCAP" in the last 168 hours. Lipid Profile: No results for input(s): "CHOL", "HDL", "LDLCALC", "TRIG", "CHOLHDL", "LDLDIRECT" in the last 72 hours. Thyroid Function Tests: No results for input(s): "TSH", "T4TOTAL", "FREET4", "T3FREE", "THYROIDAB" in the last 72 hours. Anemia Panel: No results for input(s): "VITAMINB12", "FOLATE", "FERRITIN", "TIBC", "IRON", "RETICCTPCT" in the last 72 hours. Urine analysis:    Component Value Date/Time   COLORURINE AMBER (A) 09/20/2023 0040   APPEARANCEUR CLOUDY (A) 09/20/2023 0040   APPEARANCEUR Clear 09/05/2023 1153   LABSPEC 1.015 09/20/2023 0040   PHURINE 6.0 09/20/2023 0040   GLUCOSEU NEGATIVE 09/20/2023 0040   HGBUR LARGE (A) 09/20/2023  0040   BILIRUBINUR NEGATIVE 09/20/2023 0040   BILIRUBINUR Negative 09/05/2023 1153   KETONESUR NEGATIVE 09/20/2023 0040   PROTEINUR 100 (A) 09/20/2023 0040   UROBILINOGEN 0.2 07/29/2020 1057   NITRITE NEGATIVE 09/20/2023 0040   LEUKOCYTESUR MODERATE (A) 09/20/2023 0040    Radiological Exams on Admission: DG Abdomen 1 View Result Date: 09/19/2023 CLINICAL DATA:  Lithotripsy.  Abdominal pain. EXAM: ABDOMEN - 1 VIEW COMPARISON:  Abdominal radiograph dated 06/05/2023. CT dated 06/01/2023. FINDINGS: Left pigtail ureteral catheter with proximal tip over the left flank and distal end over the urinary bladder. No radiopaque calculi noted along the course of the catheter. Faint punctate radiopaque foci superior to the catheter in the left flank likely represent stones in the upper pole of the left kidney. No bowel dilatation. Moderate stool in the colon. No free air. The osseous structures are intact. IMPRESSION: 1. Left pigtail ureteral catheter in place. No radiopaque calculi noted along the course of the catheter. 2. Probable stones in the upper pole of the left kidney. Electronically Signed   By: Elgie Collard M.D.   On: 09/19/2023 19:13   DG OR UROLOGY CYSTO IMAGE ([facility] ONLY) Result Date: 09/18/2023 There is no interpretation for this exam.  This order is for images obtained during a surgical procedure.  Please See "Surgeries" Tab for more information regarding the procedure.     Data Reviewed: Relevant notes from primary care and specialist visits, past discharge summaries as available in EHR, including Care Everywhere. Prior diagnostic testing as pertinent to current admission diagnoses Updated medications and problem lists for reconciliation ED course, including vitals, labs, imaging, treatment and response to treatment Triage notes, nursing and pharmacy notes and ED provider's notes Notable results as noted in HPI   Assessment and Plan: * Complicated UTI (urinary tract infection) S/p  lithotripsy and stent placement 09/17/23 Continue Rocephin Follow cultures Pain control and antiemetics Urology consult  Rheumatoid arthritis with positive rheumatoid factor (HCC) Hold hydroxychloroquine for now  Hypertension Continue home meds     DVT prophylaxis: Lovenox  Consults: urology  Advance Care Planning: full code  Family Communication: none  Disposition Plan: Back to previous home environment  Severity of Illness: The appropriate patient status for this patient is OBSERVATION. Observation status is judged to be reasonable and necessary in order to provide the required intensity of service to ensure the patient's safety. The patient's presenting symptoms,  physical exam findings, and initial radiographic and laboratory data in the context of their medical condition is felt to place them at decreased risk for further clinical deterioration. Furthermore, it is anticipated that the patient will be medically stable for discharge from the hospital within 2 midnights of admission.   Author: Andris Baumann, MD 09/20/2023 4:38 AM  For on call review [email or web address].

## 2023-09-20 NOTE — ED Provider Notes (Signed)
[facility] Provider Note    Event Date/Time   First MD Initiated Contact with Patient 09/19/23 2350     (approximate)   History   Chief Complaint Flank Pain   HPI  Joan Johnson is a 53 y.o. female with past medical history of hypertension, migraines, rheumatoid arthritis, and kidney stones who presents to the ED complaining of flank pain.  Patient reports that she underwent lithotripsy and ureteral stent placement yesterday morning with Dr. Apolinar Junes, has had increased pain over her left flank with nausea and vomiting since then.  She has taken pain medicine and nausea medicine at home without significant relief.  She does report a fever of 101 earlier this evening for which she took a dose of Tylenol.  She endorses some dysuria and hematuria since the stent placement.     Physical Exam   Triage Vital Signs: ED Triage Vitals  Encounter Vitals Group     BP 09/19/23 1734 125/80     Systolic BP Percentile --      Diastolic BP Percentile --      Pulse Rate 09/19/23 1734 72     Resp 09/19/23 1734 20     Temp 09/19/23 1734 98.3 F (36.8 C)     Temp Source 09/19/23 1734 Oral     SpO2 09/19/23 1734 98 %     Weight 09/19/23 1736 185 lb (83.9 kg)     Height 09/19/23 1736 5\' 3"  (1.6 m)     Head Circumference --      Peak Flow --      Pain Score 09/19/23 1733 10     Pain Loc --      Pain Education --      Exclude from Growth Chart --     Most recent vital signs: Vitals:   09/19/23 1734 09/20/23 0035  BP: 125/80 122/67  Pulse: 72 89  Resp: 20 18  Temp: 98.3 F (36.8 C)   SpO2: 98% 100%    Constitutional: Alert and oriented. Eyes: Conjunctivae are normal. Head: Atraumatic. Nose: No congestion/rhinnorhea. Mouth/Throat: Mucous membranes are moist.  Cardiovascular: Normal rate, regular rhythm. Grossly normal heart sounds.  2+ radial pulses bilaterally. Respiratory: Normal respiratory effort.  No retractions. Lungs CTAB. Gastrointestinal: Soft  and nontender.  Left CVA tenderness to palpation noted.  No distention. Musculoskeletal: No lower extremity tenderness nor edema.  Neurologic:  Normal speech and language. No gross focal neurologic deficits are appreciated.    ED Results / Procedures / Treatments   Labs (all labs ordered are listed, but only abnormal results are displayed) Labs Reviewed  URINALYSIS, ROUTINE W REFLEX MICROSCOPIC - Abnormal; Notable for the following components:      Result Value   Color, Urine AMBER (*)    APPearance CLOUDY (*)    Hgb urine dipstick LARGE (*)    Protein, ur 100 (*)    Leukocytes,Ua MODERATE (*)    Bacteria, UA RARE (*)    All other components within normal limits  BASIC METABOLIC PANEL - Abnormal; Notable for the following components:   Glucose, Bld 120 (*)    All other components within normal limits  CBC - Abnormal; Notable for the following components:   WBC 16.3 (*)    All other components within normal limits  URINE CULTURE    RADIOLOGY Abdominal x-ray reviewed and interpreted by me with appropriate positioning of left ureteral stent.  PROCEDURES:  Critical Care performed: No  Procedures   MEDICATIONS ORDERED  IN ED: Medications  fentaNYL (SUBLIMAZE) injection 25 mcg (25 mcg Intramuscular Given 09/19/23 1744)  ondansetron (ZOFRAN-ODT) disintegrating tablet 4 mg (4 mg Oral Given 09/19/23 1743)  morphine (PF) 4 MG/ML injection 4 mg (4 mg Intravenous Given 09/20/23 0116)  ondansetron (ZOFRAN) injection 4 mg (4 mg Intravenous Given 09/20/23 0116)  lactated ringers bolus 1,000 mL (0 mLs Intravenous Stopped 09/20/23 0224)  cefTRIAXone (ROCEPHIN) 1 g in sodium chloride 0.9 % 100 mL IVPB (0 g Intravenous Stopped 09/20/23 0335)     IMPRESSION / MDM / ASSESSMENT AND PLAN / ED COURSE  I reviewed the triage vital signs and the nursing notes.                              53 y.o. female with past medical history of hypertension, rheumatoid arthritis, migraines, and kidney stones  who presents to the ED complaining of worsening left flank pain, dysuria, and fever following ureteral stent placement a day and a half ago.  Patient's presentation is most consistent with acute presentation with potential threat to life or bodily function.  Differential diagnosis includes, but is not limited to, pyelonephritis, stent related pain, ureterolithiasis, anemia, electrolyte abnormality, AKI.  Patient uncomfortable but nontoxic-appearing and in no acute distress, vital signs are unremarkable.  Labs with leukocytosis but no significant anemia, electrolyte abnormality, or AKI.  Abdominal x-ray shows appropriate positioning of ureteral stent, urinalysis pending at this time.  We will treat symptomatically with IV morphine and Zofran, hydrate with IV fluids and reassess.  Urinalysis concerning for infection and was sent for culture.  Case discussed with Dr. Lonna Cobb of urology, who agrees with plan for antibiotics but no other acute intervention needed at this time.  Patient continues to have significant pain and nausea on reassessment, case discussed with hospitalist for admission.      FINAL CLINICAL IMPRESSION(S) / ED DIAGNOSES   Final diagnoses:  Nausea and vomiting, unspecified vomiting type  Pyelonephritis     Rx / DC Orders   ED Discharge Orders     None        Note:  This document was prepared using Dragon voice recognition software and may include unintentional dictation errors.   Chesley Noon, MD 09/20/23 [phone]

## 2023-09-20 NOTE — Assessment & Plan Note (Signed)
 Continue home meds

## 2023-09-20 NOTE — Assessment & Plan Note (Signed)
Hold hydroxychloroquine for now

## 2023-09-20 NOTE — Consult Note (Signed)
Brief consult note  53 y.o. female status post left ureteroscopy with laser lithotripsy multiple renal calculi by Dr. Apolinar Junes 09/17/2022.  At the completion of the procedure noted to have 1 cm left proximal ureteral mucosal injury and stent was placed with recommendations to leave indwelling x 4 weeks.  Patient presented to ED early this morning with complaints of left flank pain/nausea/vomiting and temp to 101 degrees. She was afebrile in the ED. Urinalysis with >50 RBC/21-50 WBC/11-20 squamous epithelial cells.  KUB was performed which showed the stent in excellent position.  Pertinent imaging: KUB was personally reviewed and interpreted.  Stent is in good position.  Assessment/Plan: Status post left ureteroscopy/calculi removal with small ureteral mucosal injury Admitted with nausea, vomiting, left flank pain and history of fever though has been afebrile since admission.  Admission UA with significant vaginal contamination and urine culture will most likely be negative Continue ceftriaxone pending urine culture PRN analgesics for pain Oxybutynin and tamsulosin for bladder/stent irritation   Irineo Axon, MD

## 2023-09-20 NOTE — ED Notes (Signed)
Report received from Dorian, RN 

## 2023-09-20 NOTE — Assessment & Plan Note (Addendum)
S/p lithotripsy and stent placement 09/17/23 Continue Rocephin Follow cultures Pain control and antiemetics Urology consult

## 2023-09-21 DIAGNOSIS — N390 Urinary tract infection, site not specified: Secondary | ICD-10-CM | POA: Diagnosis not present

## 2023-09-21 LAB — CBC
HCT: 33.4 % — ABNORMAL LOW (ref 36.0–46.0)
Hemoglobin: 11.1 g/dL — ABNORMAL LOW (ref 12.0–15.0)
MCH: 30.9 pg (ref 26.0–34.0)
MCHC: 33.2 g/dL (ref 30.0–36.0)
MCV: 93 fL (ref 80.0–100.0)
Platelets: 261 10*3/uL (ref 150–400)
RBC: 3.59 MIL/uL — ABNORMAL LOW (ref 3.87–5.11)
RDW: 13.1 % (ref 11.5–15.5)
WBC: 7.5 10*3/uL (ref 4.0–10.5)
nRBC: 0 % (ref 0.0–0.2)

## 2023-09-21 LAB — BASIC METABOLIC PANEL
Anion gap: 8 (ref 5–15)
BUN: 13 mg/dL (ref 6–20)
CO2: 23 mmol/L (ref 22–32)
Calcium: 8.5 mg/dL — ABNORMAL LOW (ref 8.9–10.3)
Chloride: 108 mmol/L (ref 98–111)
Creatinine, Ser: 0.64 mg/dL (ref 0.44–1.00)
GFR, Estimated: >60 mL/min (ref >60)
Glucose, Bld: 157 mg/dL — ABNORMAL HIGH (ref 70–99)
Potassium: 3.2 mmol/L — ABNORMAL LOW (ref 3.5–5.1)
Sodium: 139 mmol/L (ref 135–145)

## 2023-09-21 MED ORDER — DOCUSATE SODIUM 100 MG PO CAPS
200.0000 mg | ORAL_CAPSULE | Freq: Two times a day (BID) | ORAL | Status: DC
  Administered 2023-09-21: 200 mg via ORAL
  Filled 2023-09-21: qty 2

## 2023-09-21 MED ORDER — POLYETHYLENE GLYCOL 3350 17 G PO PACK
17.0000 g | PACK | Freq: Every day | ORAL | Status: DC
  Administered 2023-09-21: 17 g via ORAL
  Filled 2023-09-21: qty 1

## 2023-09-21 NOTE — Progress Notes (Signed)
PROGRESS NOTE    Joan Johnson  ZOX:[id number] DOB: [birth year] DOA: 09/19/2023 PCP: Sallyanne Kuster, NP  Assessment & Plan:   Principal Problem:   Complicated UTI (urinary tract infection) Active Problems:   S/P lithotripsy cystoscopy with ureteral stent placement   Hypertension   Rheumatoid arthritis with positive rheumatoid factor (HCC)  Assessment and Plan: Complicated UTI: s/p lithotripsy and stent placement in 09/18/23. Urine cx growing e. Coli, sens pending. Continue on IV rocephin. Uro following and recs apprec    RA: hold hydroxychloroquine    HTN: continue on losartan   Constipation: continue on colace, miralax. Dulcolax prn   Obesity: BMI 32.7. Would benefit from weight loss   DVT prophylaxis: lovenox  Code Status: full  Family Communication: Disposition Plan: likely d/c home tomorrow when urine cx results are finalized   Level of care: Med-Surg  Status is: Inpatient Remains inpatient appropriate because: requiring IV abxs    Consultants:    Procedures:  Antimicrobials: rocephin    Subjective: Pt c/o fatigue   Objective: Vitals:   09/20/23 1750 09/20/23 2028 09/21/23 0429 09/21/23 0742  BP: 135/72 119/72 116/73 (!) 104/58  Pulse: 89 (!) 103 81 81  Resp: 16 20 20 18   Temp: 98.3 F (36.8 C) (!) 97.3 F (36.3 C) 97.9 F (36.6 C) 98 F (36.7 C)  TempSrc: Oral Oral Oral Oral  SpO2: 99% 100% 97% 97%  Weight:      Height:        Intake/Output Summary (Last 24 hours) at 09/21/2023 0931 Last data filed at 09/21/2023 0431 Gross per 24 hour  Intake 100 ml  Output --  Net 100 ml   Filed Weights   09/19/23 1736  Weight: 83.9 kg    Examination:  General exam: Appears comfortable. Obese Respiratory system: clear breath sounds b/l  Cardiovascular system: S1/S2+. No rubs or gallops  Gastrointestinal system: abd is soft, NT, obese & hypoactive bowel sounds  Central nervous system: alert & oriented. Moves all extremities  Psychiatry:  Judgement and insight appears normal. Flat mood and affect     Data Reviewed: I have personally reviewed following labs and imaging studies  CBC: Recent Labs  Lab 09/19/23 1739 09/21/23 0607  WBC 16.3* 7.5  HGB 12.5 11.1*  HCT 37.4 33.4*  MCV 93.7 93.0  PLT 349 261   Basic Metabolic Panel: Recent Labs  Lab 09/19/23 1739 09/21/23 0607  NA 141 139  K 4.0 3.2*  CL 107 108  CO2 25 23  GLUCOSE 120* 157*  BUN 13 13  CREATININE 0.81 0.64  CALCIUM 9.3 8.5*   GFR: Estimated Creatinine Clearance: 83.5 mL/min (by C-G formula based on SCr of 0.64 mg/dL). Liver Function Tests: No results for input(s): "AST", "ALT", "ALKPHOS", "BILITOT", "PROT", "ALBUMIN" in the last 168 hours. No results for input(s): "LIPASE", "AMYLASE" in the last 168 hours. No results for input(s): "AMMONIA" in the last 168 hours. Coagulation Profile: No results for input(s): "INR", "PROTIME" in the last 168 hours. Cardiac Enzymes: No results for input(s): "CKTOTAL", "CKMB", "CKMBINDEX", "TROPONINI" in the last 168 hours. BNP (last 3 results) No results for input(s): "PROBNP" in the last 8760 hours. HbA1C: No results for input(s): "HGBA1C" in the last 72 hours. CBG: No results for input(s): "GLUCAP" in the last 168 hours. Lipid Profile: No results for input(s): "CHOL", "HDL", "LDLCALC", "TRIG", "CHOLHDL", "LDLDIRECT" in the last 72 hours. Thyroid Function Tests: No results for input(s): "TSH", "T4TOTAL", "FREET4", "T3FREE", "THYROIDAB" in the last 72 hours. Anemia Panel:  No results for input(s): "VITAMINB12", "FOLATE", "FERRITIN", "TIBC", "IRON", "RETICCTPCT" in the last 72 hours. Sepsis Labs: No results for input(s): "PROCALCITON", "LATICACIDVEN" in the last 168 hours.  No results found for this or any previous visit (from the past 240 hours).       Radiology Studies: DG Abdomen 1 View Result Date: 09/19/2023 CLINICAL DATA:  Lithotripsy.  Abdominal pain. EXAM: ABDOMEN - 1 VIEW COMPARISON:   Abdominal radiograph dated 06/05/2023. CT dated 06/01/2023. FINDINGS: Left pigtail ureteral catheter with proximal tip over the left flank and distal end over the urinary bladder. No radiopaque calculi noted along the course of the catheter. Faint punctate radiopaque foci superior to the catheter in the left flank likely represent stones in the upper pole of the left kidney. No bowel dilatation. Moderate stool in the colon. No free air. The osseous structures are intact. IMPRESSION: 1. Left pigtail ureteral catheter in place. No radiopaque calculi noted along the course of the catheter. 2. Probable stones in the upper pole of the left kidney. Electronically Signed   By: Elgie Collard M.D.   On: 09/19/2023 19:13        Scheduled Meds:  enoxaparin (LOVENOX) injection  40 mg Subcutaneous Q24H   losartan  50 mg Oral Daily   polyethylene glycol  17 g Oral Daily   tamsulosin  0.4 mg Oral Daily   Continuous Infusions:  cefTRIAXone (ROCEPHIN)  IV Stopped (09/20/23 2213)     LOS: 1 day      Charise Killian, MD [facility] Pager [phone]  If 7PM-7AM, please contact night-coverage [email or web address] 09/21/2023, 9:31 AM

## 2023-09-21 NOTE — Plan of Care (Signed)

## 2023-09-22 DIAGNOSIS — N390 Urinary tract infection, site not specified: Secondary | ICD-10-CM | POA: Diagnosis not present

## 2023-09-22 LAB — CBC
HCT: 32.6 % — ABNORMAL LOW (ref 36.0–46.0)
Hemoglobin: 11 g/dL — ABNORMAL LOW (ref 12.0–15.0)
MCH: 31.2 pg (ref 26.0–34.0)
MCHC: 33.7 g/dL (ref 30.0–36.0)
MCV: 92.4 fL (ref 80.0–100.0)
Platelets: 278 10*3/uL (ref 150–400)
RBC: 3.53 MIL/uL — ABNORMAL LOW (ref 3.87–5.11)
RDW: 13 % (ref 11.5–15.5)
WBC: 6.8 10*3/uL (ref 4.0–10.5)
nRBC: 0 % (ref 0.0–0.2)

## 2023-09-22 LAB — URINE CULTURE: Culture: 20000 — AB

## 2023-09-22 LAB — BASIC METABOLIC PANEL
Anion gap: 8 (ref 5–15)
BUN: 11 mg/dL (ref 6–20)
CO2: 26 mmol/L (ref 22–32)
Calcium: 8.5 mg/dL — ABNORMAL LOW (ref 8.9–10.3)
Chloride: 105 mmol/L (ref 98–111)
Creatinine, Ser: 0.61 mg/dL (ref 0.44–1.00)
GFR, Estimated: >60 mL/min (ref >60)
Glucose, Bld: 81 mg/dL (ref 70–99)
Potassium: 3.6 mmol/L (ref 3.5–5.1)
Sodium: 139 mmol/L (ref 135–145)

## 2023-09-22 MED ORDER — SULFAMETHOXAZOLE-TRIMETHOPRIM 800-160 MG PO TABS: 1.0000 | ORAL_TABLET | Freq: Two times a day (BID) | ORAL | 0 refills | Status: AC

## 2023-09-22 NOTE — Discharge Summary (Signed)
Physician Discharge Summary  Joan Johnson ZOX:[id number] DOB: 21-Feb-1971 DOA: 09/19/2023  PCP: Sallyanne Kuster, NP  Admit date: 09/19/2023 Discharge date: 09/22/2023  Admitted From: home  Disposition:  home   Recommendations for Outpatient Follow-up:  Follow up with PCP in 1-2 weeks F/u w/ uro at previously scheduled appt  Home Health: no  Equipment/Devices:   Discharge Condition: stable CODE STATUS: full  Diet recommendation: Heart Healthy  Brief/Interim Summary: HPI was taken from Dr. Para March: Joan Johnson is a 53 y.o. female with medical history significant for HTN, undifferentiated connective tissue disease, anxiety, nephrolithiasis s/p lithotripsy and stent placement 2 days prior who presents to the ED with intractable nausea vomiting and pain as well as fevers at home. ED course and data review: Afebrile here vitals normal. Labs notable for WBC 16,000 and UA consistent with UTI.   The EDP  spoke with urology and they advised on antibiotics, no intervention needed.Will consult Started on rocephin and hospitalist consulted for admission  Discharge Diagnoses:  Principal Problem:   Complicated UTI (urinary tract infection) Active Problems:   S/P lithotripsy cystoscopy with ureteral stent placement   Hypertension   Rheumatoid arthritis with positive rheumatoid factor (HCC)  Complicated UTI: s/p lithotripsy and stent placement in 09/18/23. Urine cx growing ESBL e. coli. D/c home on bactrim as per cx results. Uro following and recs apprec    RA: restart hydroxychloroquine    HTN: continue on losartan    Constipation: continue on colace, miralax. Dulcolax prn    Obesity: BMI 32.7. Would benefit from weight loss     Discharge Instructions  Discharge Instructions     Diet - low sodium heart healthy   Complete by: As directed    Discharge instructions   Complete by: As directed    F/u w/ PCP in 1-2 weeks. F/u urology at previously scheduled appointment    Increase activity slowly   Complete by: As directed       Allergies as of 09/22/2023       Reactions   Codeine Itching, Other (See Comments)   Ivp Dye [iodinated Contrast Media] Hives   Shellfish Allergy Hives        Medication List     STOP taking these medications    ALPRAZolam 0.25 MG tablet Commonly known as: XANAX   chlorpheniramine-HYDROcodone 10-8 MG/5ML Commonly known as: TUSSIONEX       TAKE these medications    acetaminophen 500 MG tablet Commonly known as: TYLENOL Take 1,000 mg by mouth every 6 (six) hours as needed for headache.   calcium carbonate 1500 (600 Ca) MG Tabs tablet Commonly known as: OSCAL Take 600 mg of elemental calcium by mouth daily.   EPINEPHrine 0.3 mg/0.3 mL Soaj injection Commonly known as: EPI-PEN Inject 0.3 mg into the muscle as needed for anaphylaxis.   HYDROcodone-acetaminophen 5-325 MG tablet Commonly known as: NORCO/VICODIN Take 1-2 tablets by mouth every 6 (six) hours as needed for moderate pain (pain score 4-6).   hydroxychloroquine 200 MG tablet Commonly known as: PLAQUENIL Take 200 mg by mouth daily.   Imvexxy Starter Pack 10 MCG Inst Generic drug: Estradiol Starter Pack Insert 1 supp vaginally nightly for 2 wks, then twice wkly   Imvexxy Maintenance Pack 10 MCG Inst Generic drug: Estradiol Insert 1 supp vaginally twice wkly   losartan 50 MG tablet Commonly known as: COZAAR TAKE 1 TABLET(50 MG) BY MOUTH DAILY   oxybutynin 5 MG tablet Commonly known as: DITROPAN Take 1 tablet (5 mg total) by  mouth every 8 (eight) hours as needed for bladder spasms.   sulfamethoxazole-trimethoprim 800-160 MG tablet Commonly known as: BACTRIM DS Take 1 tablet by mouth 2 (two) times daily for 5 days.   tamsulosin 0.4 MG Caps capsule Commonly known as: Flomax Take 1 capsule (0.4 mg total) by mouth daily.   Vitamin D (Ergocalciferol) 1.25 MG (50000 UNIT) Caps capsule Commonly known as: DRISDOL TAKE 1 CAPSULE BY MOUTH 1  TIME A WEEK        Allergies  Allergen Reactions   Codeine Itching and Other (See Comments)   Ivp Dye [Iodinated Contrast Media] Hives   Shellfish Allergy Hives    Consultations: Uro    Procedures/Studies: DG Abdomen 1 View Result Date: 09/19/2023 CLINICAL DATA:  Lithotripsy.  Abdominal pain. EXAM: ABDOMEN - 1 VIEW COMPARISON:  Abdominal radiograph dated 06/05/2023. CT dated 06/01/2023. FINDINGS: Left pigtail ureteral catheter with proximal tip over the left flank and distal end over the urinary bladder. No radiopaque calculi noted along the course of the catheter. Faint punctate radiopaque foci superior to the catheter in the left flank likely represent stones in the upper pole of the left kidney. No bowel dilatation. Moderate stool in the colon. No free air. The osseous structures are intact. IMPRESSION: 1. Left pigtail ureteral catheter in place. No radiopaque calculi noted along the course of the catheter. 2. Probable stones in the upper pole of the left kidney. Electronically Signed   By: Elgie Collard M.D.   On: 09/19/2023 19:13   DG OR UROLOGY CYSTO IMAGE ([facility] ONLY) Result Date: 09/18/2023 There is no interpretation for this exam.  This order is for images obtained during a surgical procedure.  Please See "Surgeries" Tab for more information regarding the procedure.   (Echo, Carotid, EGD, Colonoscopy, ERCP)    Subjective: Pt c/o fatigue    Discharge Exam: Vitals:   09/22/23 0503 09/22/23 0800  BP: 117/72 107/67  Pulse: 90 88  Resp: 20 18  Temp: 97.9 F (36.6 C) 98.3 F (36.8 C)  SpO2: 99% 98%   Vitals:   09/21/23 1543 09/21/23 2052 09/22/23 0503 09/22/23 0800  BP: 104/71 118/64 117/72 107/67  Pulse: 93 (!) 105 90 88  Resp: 16 20 20 18   Temp: 98 F (36.7 C) 98.3 F (36.8 C) 97.9 F (36.6 C) 98.3 F (36.8 C)  TempSrc: Oral Oral Oral Oral  SpO2: 97% 98% 99% 98%  Weight:      Height:        General: Pt is alert, awake, not in acute  distress Cardiovascular: S1/S2 +, no rubs, no gallops Respiratory: CTA bilaterally, no wheezing, no rhonchi Abdominal: Soft, NT, obese, bowel sounds + Extremities: no edema, no cyanosis    The results of significant diagnostics from this hospitalization (including imaging, microbiology, ancillary and laboratory) are listed below for reference.     Microbiology: Recent Results (from the past 240 hours)  Urine Culture     Status: Abnormal   Collection Time: 09/20/23 12:40 AM   Specimen: Urine, Random  Result Value Ref Range Status   Specimen Description   Final    URINE, RANDOM Performed at [facility], [address], [place], [place] [address]    Special Requests   Final    NONE Performed at [facility], [address] [place], [place] [address]    Culture (A)  Final    20,000 COLONIES/mL ESCHERICHIA COLI Confirmed Extended Spectrum Beta-Lactamase Producer (ESBL).  In bloodstream infections from ESBL organisms,  carbapenems are preferred over piperacillin/tazobactam. They are shown to have a lower risk of mortality.    Report Status 09/22/2023 FINAL  Final   Organism ID, Bacteria ESCHERICHIA COLI (A)  Final      Susceptibility   Escherichia coli - MIC*    AMPICILLIN >=32 RESISTANT Resistant     CEFAZOLIN >=64 RESISTANT Resistant     CEFEPIME >=32 RESISTANT Resistant     CEFTRIAXONE >=64 RESISTANT Resistant     CIPROFLOXACIN >=4 RESISTANT Resistant     GENTAMICIN <=1 SENSITIVE Sensitive     IMIPENEM <=0.25 SENSITIVE Sensitive     NITROFURANTOIN <=16 SENSITIVE Sensitive     TRIMETH/SULFA <=20 SENSITIVE Sensitive     AMPICILLIN/SULBACTAM 8 SENSITIVE Sensitive     PIP/TAZO <=4 SENSITIVE Sensitive ug/mL    * 20,000 COLONIES/mL ESCHERICHIA COLI     Labs: BNP (last 3 results) No results for input(s): "BNP" in the last 8760 hours. Basic Metabolic Panel: Recent Labs  Lab 09/19/23 1739 09/21/23 0607 09/22/23 0436  NA 141 139 139  K 4.0 3.2* 3.6   CL 107 108 105  CO2 25 23 26   GLUCOSE 120* 157* 81  BUN 13 13 11   CREATININE 0.81 0.64 0.61  CALCIUM 9.3 8.5* 8.5*   Liver Function Tests: No results for input(s): "AST", "ALT", "ALKPHOS", "BILITOT", "PROT", "ALBUMIN" in the last 168 hours. No results for input(s): "LIPASE", "AMYLASE" in the last 168 hours. No results for input(s): "AMMONIA" in the last 168 hours. CBC: Recent Labs  Lab 09/19/23 1739 09/21/23 0607 09/22/23 0436  WBC 16.3* 7.5 6.8  HGB 12.5 11.1* 11.0*  HCT 37.4 33.4* 32.6*  MCV 93.7 93.0 92.4  PLT 349 261 278   Cardiac Enzymes: No results for input(s): "CKTOTAL", "CKMB", "CKMBINDEX", "TROPONINI" in the last 168 hours. BNP: Invalid input(s): "POCBNP" CBG: No results for input(s): "GLUCAP" in the last 168 hours. D-Dimer No results for input(s): "DDIMER" in the last 72 hours. Hgb A1c No results for input(s): "HGBA1C" in the last 72 hours. Lipid Profile No results for input(s): "CHOL", "HDL", "LDLCALC", "TRIG", "CHOLHDL", "LDLDIRECT" in the last 72 hours. Thyroid function studies No results for input(s): "TSH", "T4TOTAL", "T3FREE", "THYROIDAB" in the last 72 hours.  Invalid input(s): "FREET3" Anemia work up No results for input(s): "VITAMINB12", "FOLATE", "FERRITIN", "TIBC", "IRON", "RETICCTPCT" in the last 72 hours. Urinalysis    Component Value Date/Time   COLORURINE AMBER (A) 09/20/2023 0040   APPEARANCEUR CLOUDY (A) 09/20/2023 0040   APPEARANCEUR Clear 09/05/2023 1153   LABSPEC 1.015 09/20/2023 0040   PHURINE 6.0 09/20/2023 0040   GLUCOSEU NEGATIVE 09/20/2023 0040   HGBUR LARGE (A) 09/20/2023 0040   BILIRUBINUR NEGATIVE 09/20/2023 0040   BILIRUBINUR Negative 09/05/2023 1153   KETONESUR NEGATIVE 09/20/2023 0040   PROTEINUR 100 (A) 09/20/2023 0040   UROBILINOGEN 0.2 07/29/2020 1057   NITRITE NEGATIVE 09/20/2023 0040   LEUKOCYTESUR MODERATE (A) 09/20/2023 0040   Sepsis Labs Recent Labs  Lab 09/19/23 1739 09/21/23 0607 09/22/23 0436  WBC  16.3* 7.5 6.8   Microbiology Recent Results (from the past 240 hours)  Urine Culture     Status: Abnormal   Collection Time: 09/20/23 12:40 AM   Specimen: Urine, Random  Result Value Ref Range Status   Specimen Description   Final    URINE, RANDOM Performed at [facility], [address], [place], [place] [address]    Special Requests   Final    NONE Performed at [facility], [address]  [place], [place] [address]    Culture (A)  Final    20,000 COLONIES/mL ESCHERICHIA COLI Confirmed Extended Spectrum Beta-Lactamase Producer (ESBL).  In bloodstream infections from ESBL organisms, carbapenems are preferred over piperacillin/tazobactam. They are shown to have a lower risk of mortality.    Report Status 09/22/2023 FINAL  Final   Organism ID, Bacteria ESCHERICHIA COLI (A)  Final      Susceptibility   Escherichia coli - MIC*    AMPICILLIN >=32 RESISTANT Resistant     CEFAZOLIN >=64 RESISTANT Resistant     CEFEPIME >=32 RESISTANT Resistant     CEFTRIAXONE >=64 RESISTANT Resistant     CIPROFLOXACIN >=4 RESISTANT Resistant     GENTAMICIN <=1 SENSITIVE Sensitive     IMIPENEM <=0.25 SENSITIVE Sensitive     NITROFURANTOIN <=16 SENSITIVE Sensitive     TRIMETH/SULFA <=20 SENSITIVE Sensitive     AMPICILLIN/SULBACTAM 8 SENSITIVE Sensitive     PIP/TAZO <=4 SENSITIVE Sensitive ug/mL    * 20,000 COLONIES/mL ESCHERICHIA COLI     Time coordinating discharge: Over 30 minutes  SIGNED:   Charise Killian, MD  [facility] 09/22/2023, 12:20 PM Pager   If 7PM-7AM, please contact night-coverage [email or web address]

## 2023-09-22 NOTE — Plan of Care (Signed)
  Problem: Education: Goal: Knowledge of General Education information will improve Description: Including pain rating scale, medication(s)/side effects and non-pharmacologic comfort measures Outcome: Adequate for Discharge   Problem: Health Behavior/Discharge Planning: Goal: Ability to manage health-related needs will improve Outcome: Adequate for Discharge   Problem: Clinical Measurements: Goal: Ability to maintain clinical measurements within normal limits will improve Outcome: Adequate for Discharge Goal: Will remain free from infection Outcome: Adequate for Discharge Goal: Diagnostic test results will improve Outcome: Adequate for Discharge Goal: Respiratory complications will improve Outcome: Adequate for Discharge Goal: Cardiovascular complication will be avoided Outcome: Adequate for Discharge   Problem: Activity: Goal: Risk for activity intolerance will decrease Outcome: Adequate for Discharge   Problem: Nutrition: Goal: Adequate nutrition will be maintained Outcome: Adequate for Discharge   Problem: Coping: Goal: Level of anxiety will decrease Outcome: Adequate for Discharge   Problem: Elimination: Goal: Will not experience complications related to bowel motility Outcome: Adequate for Discharge Goal: Will not experience complications related to urinary retention Outcome: Adequate for Discharge   Problem: Pain Managment: Goal: General experience of comfort will improve and/or be controlled Outcome: Adequate for Discharge   Problem: Safety: Goal: Ability to remain free from injury will improve Outcome: Adequate for Discharge   Problem: Skin Integrity: Goal: Risk for impaired skin integrity will decrease Outcome: Adequate for Discharge   Problem: Education: Goal: Knowledge of General Education information will improve Description: Including pain rating scale, medication(s)/side effects and non-pharmacologic comfort measures Outcome: Adequate for  Discharge   Problem: Health Behavior/Discharge Planning: Goal: Ability to manage health-related needs will improve Outcome: Adequate for Discharge   Problem: Clinical Measurements: Goal: Ability to maintain clinical measurements within normal limits will improve Outcome: Adequate for Discharge Goal: Will remain free from infection Outcome: Adequate for Discharge Goal: Diagnostic test results will improve Outcome: Adequate for Discharge Goal: Respiratory complications will improve Outcome: Adequate for Discharge Goal: Cardiovascular complication will be avoided Outcome: Adequate for Discharge   Problem: Activity: Goal: Risk for activity intolerance will decrease Outcome: Adequate for Discharge   Problem: Nutrition: Goal: Adequate nutrition will be maintained Outcome: Adequate for Discharge   Problem: Coping: Goal: Level of anxiety will decrease Outcome: Adequate for Discharge   Problem: Elimination: Goal: Will not experience complications related to bowel motility Outcome: Adequate for Discharge Goal: Will not experience complications related to urinary retention Outcome: Adequate for Discharge   Problem: Pain Managment: Goal: General experience of comfort will improve and/or be controlled Outcome: Adequate for Discharge   Problem: Safety: Goal: Ability to remain free from injury will improve Outcome: Adequate for Discharge   Problem: Skin Integrity: Goal: Risk for impaired skin integrity will decrease Outcome: Adequate for Discharge   Pt aox4, pt has all belongings. Pt has received all DC instructions. Pt being taken home by husband. Pt taken down to lobby via wheelchair

## 2023-09-22 NOTE — TOC CM/SW Note (Signed)
Transition of Care [facility]) - Inpatient Brief Assessment   Patient Details  Name: Joan Johnson MRN: [id number] Date of Birth: 1971/01/24  [facility]) CM/SW Contact:    Margarito Liner, LCSW Phone Number: 09/22/2023, 11:19 AM   Clinical Narrative: CSW reviewed chart. No TOC needs identified so far. CSW will continue to follow progress. Please place [facility] consult if any needs arise.  Transition of Care Asessment: Insurance and Status: Insurance coverage has been reviewed Patient has primary care physician: Yes Home environment has been reviewed: Apartment Prior level of function:: Not documented Prior/Current Home Services: No current home services Social Drivers of Health Review: SDOH reviewed no interventions necessary Readmission risk has been reviewed: Yes Transition of care needs: no transition of care needs at this time

## 2023-09-26 ENCOUNTER — Telehealth: Payer: Self-pay

## 2023-09-26 ENCOUNTER — Other Ambulatory Visit: Payer: Self-pay | Admitting: Physician Assistant

## 2023-09-26 DIAGNOSIS — I10 Essential (primary) hypertension: Secondary | ICD-10-CM

## 2023-09-26 NOTE — Telephone Encounter (Signed)
 Pt LM on triage line stating that she had a procedure and is still bleeding. She questions if this is normal.  Upon chart review patient had ureteral stent placed 09/18/2023. Called pt she states that is seeing blood in her urine off and on throughout the day. She denies fever, chills, dysuria, or pain. She states she has been compliant with taking Bactrim prescribed by the ED. Her last dose of Bactrim is tomorrow. Advised pt that intermittent hematuria is normal with a stent in place, gave patient return precautions and signs and symptoms to be watchful for including fever, chills, large blood clots, and uncontrollable pain. Patient voiced understanding. Patient education on tolerating sent via mychart.

## 2023-09-28 ENCOUNTER — Ambulatory Visit: Payer: Commercial Managed Care - PPO | Admitting: Nurse Practitioner

## 2023-09-28 ENCOUNTER — Encounter: Payer: Self-pay | Admitting: Nurse Practitioner

## 2023-09-28 VITALS — BP 130/80 | HR 98 | Temp 98.5°F | Resp 16 | Ht 63.0 in | Wt 186.6 lb

## 2023-09-28 DIAGNOSIS — R300 Dysuria: Secondary | ICD-10-CM | POA: Diagnosis not present

## 2023-09-28 DIAGNOSIS — N390 Urinary tract infection, site not specified: Secondary | ICD-10-CM | POA: Diagnosis not present

## 2023-09-28 DIAGNOSIS — R319 Hematuria, unspecified: Secondary | ICD-10-CM | POA: Diagnosis not present

## 2023-09-28 DIAGNOSIS — I10 Essential (primary) hypertension: Secondary | ICD-10-CM | POA: Diagnosis not present

## 2023-09-28 DIAGNOSIS — Z09 Encounter for follow-up examination after completed treatment for conditions other than malignant neoplasm: Secondary | ICD-10-CM

## 2023-09-28 LAB — POCT URINALYSIS DIPSTICK
Bilirubin, UA: NEGATIVE
Glucose, UA: NEGATIVE
Nitrite, UA: NEGATIVE
Protein, UA: NEGATIVE
Spec Grav, UA: 1.025 (ref 1.010–1.025)
Urobilinogen, UA: 0.2 U/dL
pH, UA: 6.5 (ref 5.0–8.0)

## 2023-09-28 LAB — HM DEXA SCAN

## 2023-09-28 LAB — HM MAMMOGRAPHY

## 2023-09-28 MED ORDER — LOSARTAN POTASSIUM 50 MG PO TABS: 50.0000 mg | ORAL_TABLET | Freq: Every day | ORAL | 2 refills | Status: DC

## 2023-09-28 MED ORDER — VITAMIN D (ERGOCALCIFEROL) 1.25 MG (50000 UNIT) PO CAPS: 50000.0000 [IU] | ORAL_CAPSULE | ORAL | 3 refills | Status: DC

## 2023-09-28 NOTE — Progress Notes (Signed)
 [facility] [place], [place] [address] [place] [address] [phone]                                   Transitional Care Clinic   [facility] Discharge Acute Issues Care Follow Up                                                                        Patient Demographics  Joan Johnson, is a 53 y.o. female  DOB 04/15/1971  MRN [id number].  Primary MD  Sallyanne Kuster, NP  Admit date: 09/19/2023 Discharge date: 09/22/2023  Reason for TCC follow Up - complicated UTI -- ESBL-producing E.coli   Past Medical History:  Diagnosis Date   Allergy    Anxiety    just when traveling   Arthritis    GERD (gastroesophageal reflux disease)    history   History of kidney stones    Hypertension    Left nephrolithiasis    Migraine    Osteopenia 06/2021   DEXA at [facility]; spine and hip   Pneumonia    Undifferentiated connective tissue disease (HCC)     Past Surgical History:  Procedure Laterality Date   ANKLE SURGERY     AUGMENTATION MAMMAPLASTY     Breast Implant   CESAREAN SECTION     1991, 1989, 1987   COLONOSCOPY WITH PROPOFOL N/A 10/01/2020   Procedure: COLONOSCOPY WITH PROPOFOL;  Surgeon: Toney Reil, MD;  Location: [facility] ENDOSCOPY;  Service: Gastroenterology;  Laterality: N/A;   COSMETIC SURGERY     CYSTOSCOPY W/ RETROGRADES Left 07/19/2021   Procedure: CYSTOSCOPY WITH RETROGRADE PYELOGRAM;  Surgeon: Vanna Scotland, MD;  Location: [facility] ORS;  Service: Urology;  Laterality: Left;   CYSTOSCOPY/URETEROSCOPY/HOLMIUM LASER/STENT PLACEMENT Left 07/19/2021   Procedure: CYSTOSCOPY/URETEROSCOPY/HOLMIUM LASER/STENT PLACEMENT;  Surgeon: Vanna Scotland, MD;  Location: [facility] ORS;  Service: Urology;  Laterality: Left;   CYSTOSCOPY/URETEROSCOPY/HOLMIUM LASER/STENT PLACEMENT Left 09/18/2023   Procedure: CYSTOSCOPY/URETEROSCOPY/HOLMIUM LASER/STENT PLACEMENT;  Surgeon: Vanna Scotland, MD;  Location: [facility] ORS;  Service: Urology;  Laterality: Left;    dental implants     ESOPHAGOGASTRODUODENOSCOPY (EGD) WITH PROPOFOL N/A 10/01/2020   Procedure: ESOPHAGOGASTRODUODENOSCOPY (EGD) WITH PROPOFOL;  Surgeon: Toney Reil, MD;  Location: [facility] ENDOSCOPY;  Service: Gastroenterology;  Laterality: N/A;   HERNIA REPAIR     OTHER SURGICAL HISTORY     tummy tuck   OTHER SURGICAL HISTORY     carpal tunnel   TUBAL LIGATION         Recent HPI and Hospital Course  Brief/Interim Summary: HPI was taken from Dr. Para March: Joan Johnson is a 53 y.o. female with medical history significant for HTN, undifferentiated connective tissue disease, anxiety, nephrolithiasis s/p lithotripsy and stent placement 2 days prior who presents to the ED with intractable nausea vomiting and pain as well as fevers at home. ED course and data review: Afebrile here vitals normal. Labs notable for WBC 16,000 and UA consistent with UTI.   The EDP  spoke with urology and they advised on antibiotics, no intervention needed.Will consult Started on rocephin and hospitalist consulted for admission  Complicated UTI: s/p lithotripsy and stent placement in 09/18/23. Urine  cx growing ESBL e. coli. D/c home on bactrim as per cx results. Uro following and recs apprec    RA: restart hydroxychloroquine    HTN: continue on losartan    Constipation: continue on colace, miralax. Dulcolax prn    Obesity: BMI 32.7. Would benefit from weight loss   Post Hospital Acute Care Issue to be followed in the Clinic    Complicated UTI (urinary tract infection) Active Problems:   S/P lithotripsy cystoscopy with ureteral stent placement   Hypertension   Rheumatoid arthritis with positive rheumatoid factor (HCC)   Subjective:   Joan Johnson today has, No headache, No chest pain, No abdominal pain - No Nausea, No new weakness tingling or numbness, No Cough - SOB.  Had a stent placed, has had increased frequency and continued urinary discomfort.   Assessment & Plan   1. Hospital  discharge follow-up (Primary) Treated in the hospital with IV ceftriaxone then switched to oral bactrim when discharged.   2. Primary hypertension Stable, continue losartan as prescribed.  - losartan (COZAAR) 50 MG tablet; Take 1 tablet (50 mg total) by mouth daily.  Dispense: 90 tablet; Refill: 2  3. Urinary tract infection with hematuria, site unspecified Urine culture sent to assess for resolution - CULTURE, URINE COMPREHENSIVE  4. Dysuria Urinalysis and culture sent to check for infection again - POCT Urinalysis Dipstick    Reason for frequent admissions/ER visits    UTI Hypertension   Objective:   Vitals:   09/28/23 1109  BP: 130/80  Pulse: (!) 108  Resp: 16  Temp: 98.5 F (36.9 C)  SpO2: 96%  Weight: 186 lb 9.6 oz (84.6 kg)  Height: 5\' 3"  (1.6 m)    Wt Readings from Last 3 Encounters:  09/28/23 186 lb 9.6 oz (84.6 kg)  09/19/23 185 lb (83.9 kg)  09/18/23 186 lb (84.4 kg)    Allergies as of 09/28/2023       Reactions   Codeine Itching, Other (See Comments)   Ivp Dye [iodinated Contrast Media] Hives   Shellfish Allergy Hives        Medication List        Accurate as of September 28, 2023 12:14 PM. If you have any questions, ask your nurse or doctor.          acetaminophen 500 MG tablet Commonly known as: TYLENOL Take 1,000 mg by mouth every 6 (six) hours as needed for headache.   calcium carbonate 1500 (600 Ca) MG Tabs tablet Commonly known as: OSCAL Take 600 mg of elemental calcium by mouth daily.   EPINEPHrine 0.3 mg/0.3 mL Soaj injection Commonly known as: EPI-PEN Inject 0.3 mg into the muscle as needed for anaphylaxis.   HYDROcodone-acetaminophen 5-325 MG tablet Commonly known as: NORCO/VICODIN Take 1-2 tablets by mouth every 6 (six) hours as needed for moderate pain (pain score 4-6).   hydroxychloroquine 200 MG tablet Commonly known as: PLAQUENIL Take 200 mg by mouth daily.   Imvexxy Starter Pack 10 MCG Inst Generic drug:  Estradiol Starter Pack Insert 1 supp vaginally nightly for 2 wks, then twice wkly   Imvexxy Maintenance Pack 10 MCG Inst Generic drug: Estradiol Insert 1 supp vaginally twice wkly   losartan 50 MG tablet Commonly known as: COZAAR Take 1 tablet (50 mg total) by mouth daily. What changed: See the new instructions. Changed by: Sallyanne Kuster   oxybutynin 5 MG tablet Commonly known as: DITROPAN Take 1 tablet (5 mg total) by mouth every 8 (eight) hours as needed  for bladder spasms.   tamsulosin 0.4 MG Caps capsule Commonly known as: Flomax Take 1 capsule (0.4 mg total) by mouth daily.   Vitamin D (Ergocalciferol) 1.25 MG (50000 UNIT) Caps capsule Commonly known as: DRISDOL Take 1 capsule (50,000 Units total) by mouth every 7 (seven) days. What changed: See the new instructions. Changed by: Sallyanne Kuster         Physical Exam: Constitutional: Patient appears well-developed and well-nourished. Not in obvious distress. HENT: Normocephalic, atraumatic, External right and left ear normal. Oropharynx is clear and moist.  Eyes: Conjunctivae and EOM are normal. PERRLA, no scleral icterus. Neck: Normal ROM. Neck supple. No JVD. No tracheal deviation. No thyromegaly. CVS: RRR, S1/S2 +, no murmurs, no gallops, no carotid bruit.  Pulmonary: Effort and breath sounds normal, no stridor, rhonchi, wheezes, rales.  Abdominal: Soft. BS +, no distension, tenderness, rebound or guarding.  Musculoskeletal: Normal range of motion. No edema and no tenderness.  Lymphadenopathy: No lymphadenopathy noted, cervical, inguinal or axillary Neuro: Alert. Normal reflexes, muscle tone coordination. No cranial nerve deficit. Skin: Skin is warm and dry. No rash noted. Not diaphoretic. No erythema. No pallor. Psychiatric: Normal mood and affect. Behavior, judgment, thought content normal.   Data Review   Micro Results Recent Results (from the past 240 hours)  Urine Culture     Status: Abnormal    Collection Time: 09/20/23 12:40 AM   Specimen: Urine, Random  Result Value Ref Range Status   Specimen Description   Final    URINE, RANDOM Performed at [facility], [address] [place], [place] [address]    Special Requests   Final    NONE Performed at [facility], [address] [place], [place] [address]    Culture (A)  Final    20,000 COLONIES/mL ESCHERICHIA COLI Confirmed Extended Spectrum Beta-Lactamase Producer (ESBL).  In bloodstream infections from ESBL organisms, carbapenems are preferred over piperacillin/tazobactam. They are shown to have a lower risk of mortality.    Report Status 09/22/2023 FINAL  Final   Organism ID, Bacteria ESCHERICHIA COLI (A)  Final      Susceptibility   Escherichia coli - MIC*    AMPICILLIN >=32 RESISTANT Resistant     CEFAZOLIN >=64 RESISTANT Resistant     CEFEPIME >=32 RESISTANT Resistant     CEFTRIAXONE >=64 RESISTANT Resistant     CIPROFLOXACIN >=4 RESISTANT Resistant     GENTAMICIN <=1 SENSITIVE Sensitive     IMIPENEM <=0.25 SENSITIVE Sensitive     NITROFURANTOIN <=16 SENSITIVE Sensitive     TRIMETH/SULFA <=20 SENSITIVE Sensitive     AMPICILLIN/SULBACTAM 8 SENSITIVE Sensitive     PIP/TAZO <=4 SENSITIVE Sensitive ug/mL    * 20,000 COLONIES/mL ESCHERICHIA COLI     CBC Recent Labs  Lab 09/22/23 0436  WBC 6.8  HGB 11.0*  HCT 32.6*  PLT 278  MCV 92.4  MCH 31.2  MCHC 33.7  RDW 13.0    Chemistries  Recent Labs  Lab 09/22/23 0436  NA 139  K 3.6  CL 105  CO2 26  GLUCOSE 81  BUN 11  CREATININE 0.61  CALCIUM 8.5*   ------------------------------------------------------------------------------------------------------------------ estimated creatinine clearance is 83.8 mL/min (by C-G formula based on SCr of 0.61 mg/dL). ------------------------------------------------------------------------------------------------------------------ No results for input(s): "HGBA1C" in the last 72  hours. ------------------------------------------------------------------------------------------------------------------ No results for input(s): "CHOL", "HDL", "LDLCALC", "TRIG", "CHOLHDL", "LDLDIRECT" in the last 72 hours. ------------------------------------------------------------------------------------------------------------------ No results for input(s): "TSH", "T4TOTAL", "T3FREE", "THYROIDAB" in the last 72 hours.  Invalid input(s): "  FREET3" ------------------------------------------------------------------------------------------------------------------ No results for input(s): "VITAMINB12", "FOLATE", "FERRITIN", "TIBC", "IRON", "RETICCTPCT" in the last 72 hours.  Coagulation profile No results for input(s): "INR", "PROTIME" in the last 168 hours.  No results for input(s): "DDIMER" in the last 72 hours.  Cardiac Enzymes No results for input(s): "CKMB", "TROPONINI", "MYOGLOBIN" in the last 168 hours.  Invalid input(s): "CK" ------------------------------------------------------------------------------------------------------------------ Invalid input(s): "POCBNP"  Return in about 3 months (around 12/26/2023) for F/U, Joan Johnson PCP.   Time Spent in minutes  45 Time spent with patient included reviewing progress notes, labs, imaging studies, and discussing plan for follow up.   This patient was seen by Sallyanne Kuster, FNP-C in collaboration with Dr. Beverely Risen as a part of collaborative care agreement.    Sallyanne Kuster MSN, FNP-C on 09/28/2023 at 12:14 PM   **Disclaimer: This note may have been dictated with voice recognition software. Similar sounding words can inadvertently be transcribed and this note may contain transcription errors which may not have been corrected upon publication of note.**

## 2023-10-02 ENCOUNTER — Encounter: Payer: Self-pay | Admitting: Obstetrics and Gynecology

## 2023-10-02 LAB — CULTURE, URINE COMPREHENSIVE

## 2023-10-03 ENCOUNTER — Encounter: Payer: Self-pay | Admitting: Obstetrics and Gynecology

## 2023-10-06 ENCOUNTER — Telehealth: Payer: Self-pay

## 2023-10-06 NOTE — Telephone Encounter (Signed)
 Pt LM on triage line requesting call back.  Called pt back she questions if it is normal to see blood in her urine and have urinary frequency, and odor with stent in place. Advised pt that per our previous conversation it is normal to see blood intermittently with a stent in place, again have bleeding precautions. Questioned pt on fluid intake as this contributes to urine odor, she states she is only drinking approximately 24oz of urine during the day. 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 Lastly advised pt that urinary frequency is normal with stent in place. She states she is taking Oxybuytnin as prescribed. Advised pt I would inquire if dosage of Oxybutynin could be increased. Please advise.

## 2023-10-09 ENCOUNTER — Other Ambulatory Visit: Payer: Self-pay

## 2023-10-09 MED ORDER — GEMTESA 75 MG PO TABS: 1.0000 | ORAL_TABLET | Freq: Every day | ORAL | Status: DC

## 2023-10-09 NOTE — Addendum Note (Signed)
 Addended by: Consuella Lose on: 10/09/2023 02:44 PM   Modules accepted: Orders

## 2023-10-09 NOTE — Telephone Encounter (Signed)
 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 Patient will stop by the office and get Gemtesa samples to try until her appointment on 10/19/23

## 2023-10-09 NOTE — Telephone Encounter (Signed)
 She can take oxybutynin up to 3 times a day.  If this is not helping, she can stop by the office and pick up a sample of Gemtesa if we have it.  Vanna Scotland, MD

## 2023-10-12 ENCOUNTER — Encounter: Payer: Self-pay | Admitting: Obstetrics and Gynecology

## 2023-10-13 ENCOUNTER — Other Ambulatory Visit: Payer: Self-pay | Admitting: Urology

## 2023-10-17 ENCOUNTER — Encounter: Payer: Commercial Managed Care - PPO | Admitting: Urology

## 2023-10-19 ENCOUNTER — Ambulatory Visit: Payer: Commercial Managed Care - PPO | Admitting: Urology

## 2023-10-19 VITALS — BP 134/84 | HR 74 | Ht 63.0 in | Wt 186.4 lb

## 2023-10-19 DIAGNOSIS — Z466 Encounter for fitting and adjustment of urinary device: Secondary | ICD-10-CM

## 2023-10-19 DIAGNOSIS — Z792 Long term (current) use of antibiotics: Secondary | ICD-10-CM

## 2023-10-19 DIAGNOSIS — N200 Calculus of kidney: Secondary | ICD-10-CM | POA: Diagnosis not present

## 2023-10-19 LAB — URINALYSIS, COMPLETE
Bilirubin, UA: NEGATIVE
Glucose, UA: NEGATIVE
Ketones, UA: NEGATIVE
Nitrite, UA: NEGATIVE
Protein,UA: NEGATIVE
Specific Gravity, UA: 1.015 (ref 1.005–1.030)
Urobilinogen, Ur: 0.2 mg/dL (ref 0.2–1.0)
pH, UA: 7.5 (ref 5.0–7.5)

## 2023-10-19 LAB — MICROSCOPIC EXAMINATION
Bacteria, UA: NONE SEEN
RBC, Urine: >30 /HPF — AB (ref 0–2)

## 2023-10-19 MED ORDER — CEPHALEXIN 250 MG PO CAPS
500.0000 mg | ORAL_CAPSULE | Freq: Once | ORAL | Status: AC
Start: 2023-10-19 — End: 2023-10-19
  Administered 2023-10-19: 500 mg via ORAL

## 2023-10-19 NOTE — Progress Notes (Signed)
   10/19/23  CC:  Chief Complaint  Patient presents with   Cysto Stent Removal    HPI: 53 year old female who presents today for cystoscopy, stent removal.  See previous notes for details.  Blood pressure 134/84, pulse 74, height 5\' 3"  (1.6 m), weight 186 lb 6 oz (84.5 kg). NED. A&Ox3.   No respiratory distress   Abd soft, NT, ND Normal external genitalia with patent urethral meatus  Cystoscopy/ Stent removal procedure  Patient identification was confirmed, informed consent was obtained, and patient was prepped using Betadine solution.  Lidocaine jelly was administered per urethral meatus.    Preoperative abx where received prior to procedure.    Procedure: - Flexible cystoscope introduced, without any difficulty.   - Thorough search of the bladder revealed:    normal urethral meatus  Stent seen emanating from left ureteral orifice, grasped with stent graspers, and removed in entirety.    Post-Procedure: - Patient tolerated the procedure well   Assessment/ Plan:  1. Left nephrolithiasis (Primary) Status post ureteroscopy  Stent removed today without difficulty  Follow-up in 4 weeks with renal ultrasound prior  Warning symptoms reviewed - Urinalysis, Complete - Ultrasound renal complete; Future  2. Prophylactic antibiotic - cephALEXin (KEFLEX) capsule 500 mg  3. Encounter for removal of ureteral stent - Ultrasound renal complete; Future   Vanna Scotland, MD

## 2023-11-12 ENCOUNTER — Emergency Department
Admission: EM | Admit: 2023-11-12 | Discharge: 2023-11-12 | Disposition: A | Discharge status: Home / Self Care | Attending: Emergency Medicine | Admitting: Emergency Medicine

## 2023-11-12 ENCOUNTER — Emergency Department

## 2023-11-12 ENCOUNTER — Other Ambulatory Visit: Payer: Self-pay

## 2023-11-12 DIAGNOSIS — R109 Unspecified abdominal pain: Secondary | ICD-10-CM | POA: Insufficient documentation

## 2023-11-12 DIAGNOSIS — R000 Tachycardia, unspecified: Secondary | ICD-10-CM | POA: Diagnosis not present

## 2023-11-12 LAB — COMPREHENSIVE METABOLIC PANEL WITH GFR
ALT: 54 U/L — ABNORMAL HIGH (ref 0–44)
AST: 36 U/L (ref 15–41)
Albumin: 4.1 g/dL (ref 3.5–5.0)
Alkaline Phosphatase: 54 U/L (ref 38–126)
Anion gap: 11 (ref 5–15)
BUN: 16 mg/dL (ref 6–20)
CO2: 22 mmol/L (ref 22–32)
Calcium: 9.5 mg/dL (ref 8.9–10.3)
Chloride: 106 mmol/L (ref 98–111)
Creatinine, Ser: 0.64 mg/dL (ref 0.44–1.00)
GFR, Estimated: >60 mL/min (ref >60)
Glucose, Bld: 153 mg/dL — ABNORMAL HIGH (ref 70–99)
Potassium: 3.8 mmol/L (ref 3.5–5.1)
Sodium: 139 mmol/L (ref 135–145)
Total Bilirubin: 1.4 mg/dL — ABNORMAL HIGH (ref 0.0–1.2)
Total Protein: 6.8 g/dL (ref 6.5–8.1)

## 2023-11-12 LAB — CBC WITH DIFFERENTIAL/PLATELET
Abs Immature Granulocytes: 0.02 10*3/uL (ref 0.00–0.07)
Basophils Absolute: 0 10*3/uL (ref 0.0–0.1)
Basophils Relative: 0 %
Eosinophils Absolute: 0.1 10*3/uL (ref 0.0–0.5)
Eosinophils Relative: 1 %
HCT: 41.5 % (ref 36.0–46.0)
Hemoglobin: 13.8 g/dL (ref 12.0–15.0)
Immature Granulocytes: 0 %
Lymphocytes Relative: 3 %
Lymphs Abs: 0.3 10*3/uL — ABNORMAL LOW (ref 0.7–4.0)
MCH: 31.1 pg (ref 26.0–34.0)
MCHC: 33.3 g/dL (ref 30.0–36.0)
MCV: 93.5 fL (ref 80.0–100.0)
Monocytes Absolute: 0.4 10*3/uL (ref 0.1–1.0)
Monocytes Relative: 4 %
Neutro Abs: 7.9 10*3/uL — ABNORMAL HIGH (ref 1.7–7.7)
Neutrophils Relative %: 92 %
Platelets: 221 10*3/uL (ref 150–400)
RBC: 4.44 MIL/uL (ref 3.87–5.11)
RDW: 12.9 % (ref 11.5–15.5)
WBC: 8.7 10*3/uL (ref 4.0–10.5)
nRBC: 0 % (ref 0.0–0.2)

## 2023-11-12 LAB — URINALYSIS, W/ REFLEX TO CULTURE (INFECTION SUSPECTED)
Bacteria, UA: NONE SEEN
Bilirubin Urine: NEGATIVE
Glucose, UA: NEGATIVE mg/dL
Hgb urine dipstick: NEGATIVE
Ketones, ur: NEGATIVE mg/dL
Leukocytes,Ua: NEGATIVE
Nitrite: NEGATIVE
Protein, ur: 30 mg/dL — AB
Specific Gravity, Urine: 1.024 (ref 1.005–1.030)
pH: 7 (ref 5.0–8.0)

## 2023-11-12 LAB — LACTIC ACID, PLASMA
Lactic Acid, Venous: 2.2 mmol/L (ref 0.5–1.9)
Lactic Acid, Venous: 1.3 mmol/L (ref 0.5–1.9)

## 2023-11-12 LAB — PROTIME-INR
INR: 1 (ref 0.8–1.2)
Prothrombin Time: 13.3 s (ref 11.4–15.2)

## 2023-11-12 MED ORDER — SODIUM CHLORIDE 0.9 % IV BOLUS
1000.0000 mL | Freq: Once | INTRAVENOUS | Status: AC
  Administered 2023-11-12: 1000 mL via INTRAVENOUS

## 2023-11-12 MED ORDER — KETOROLAC TROMETHAMINE 15 MG/ML IJ SOLN
15.0000 mg | Freq: Once | INTRAMUSCULAR | Status: AC
Start: 2023-11-12 — End: 2023-11-12
  Administered 2023-11-12: 15 mg via INTRAVENOUS
  Filled 2023-11-12: qty 1

## 2023-11-12 NOTE — ED Notes (Signed)
Pt adamantly refusing covid swab

## 2023-11-12 NOTE — ED Notes (Signed)
 Pt refused Resp panel swab

## 2023-11-12 NOTE — ED Provider Notes (Signed)
 [facility] Provider Note    Event Date/Time   First MD Initiated Contact with Patient 11/12/23 1732     (approximate)   History   Flank pain   HPI  Joan Johnson is a 53 y.o. female   who presents to the emergency department today because of concerns for left flank pain.  Patient states that she typically has some pain on that side secondary to previous kidney stones although today the pain was significantly worse.  In addition the patient started noticing body aches.  Did have a fever at home and took Tylenol.  Has not had any burning with urination although did have some discomfort.  Patient denies any chest pain or shortness of breath.   Physical Exam   Triage Vital Signs: ED Triage Vitals  Encounter Vitals Group     BP 11/12/23 1717 138/86     Systolic BP Percentile --      Diastolic BP Percentile --      Pulse Rate 11/12/23 1717 (!) 124     Resp 11/12/23 1717 20     Temp 11/12/23 1756 99.5 F (37.5 C)     Temp Source 11/12/23 1717 Oral     SpO2 11/12/23 1717 97 %     Weight 11/12/23 1718 200 lb (90.7 kg)     Height 11/12/23 1718 5\' 3"  (1.6 m)     Head Circumference --      Peak Flow --      Pain Score 11/12/23 1718 7     Pain Loc --      Pain Education --      Exclude from Growth Chart --     Most recent vital signs: Vitals:   11/12/23 2200 11/12/23 2235  BP: 119/79   Pulse: (!) 106   Resp:  18  Temp:    SpO2: 99%    General: Awake, alert, oriented. CV:  Good peripheral perfusion. Tachycardia. Resp:  Normal effort. Lungs clear Abd:  No distention. Non tender.   ED Results / Procedures / Treatments   Labs (all labs ordered are listed, but only abnormal results are displayed) Labs Reviewed  COMPREHENSIVE METABOLIC PANEL WITH GFR - Abnormal; Notable for the following components:      Result Value   Glucose, Bld 153 (*)    ALT 54 (*)    Total Bilirubin 1.4 (*)    All other components within normal limits  LACTIC ACID,  PLASMA - Abnormal; Notable for the following components:   Lactic Acid, Venous 2.2 (*)    All other components within normal limits  CBC WITH DIFFERENTIAL/PLATELET - Abnormal; Notable for the following components:   Neutro Abs 7.9 (*)    Lymphs Abs 0.3 (*)    All other components within normal limits  URINALYSIS, W/ REFLEX TO CULTURE (INFECTION SUSPECTED) - Abnormal; Notable for the following components:   Color, Urine YELLOW (*)    APPearance CLOUDY (*)    Protein, ur 30 (*)    All other components within normal limits  CULTURE, BLOOD (ROUTINE X 2)  CULTURE, BLOOD (ROUTINE X 2)  RESP PANEL BY RT-PCR (RSV, FLU A&B, COVID)  RVPGX2  LACTIC ACID, PLASMA  PROTIME-INR    EKG  None   RADIOLOGY I independently interpreted and visualized the CXR. My interpretation: No pneumonia Radiology interpretation:  IMPRESSION:  No active cardiopulmonary disease.   I independently interpreted and visualized the CT abd/pel. My interpretation: No free air Radiology interpretation:  IMPRESSION:  1. No acute intra-abdominal process.  2. No renal or ureteral calculus or obstructive uropathy  bilaterally.  3. Diverticulosis without diverticulitis.      PROCEDURES:  Critical Care performed: No    MEDICATIONS ORDERED IN ED: Medications - No data to display   IMPRESSION / MDM / ASSESSMENT AND PLAN / ED COURSE  I reviewed the triage vital signs and the nursing notes.                              Differential diagnosis includes, but is not limited to, pyelonephritis, UTI, viral illness, pneumonia  Patient's presentation is most consistent with acute presentation with potential threat to life or bodily function.  Patient presented to the emergency department today with concerns for left flank pain.  On exam patient's abdomen was benign.  Blood work with no leukocytosis, very mild lactic acidosis.  This did resolve after IV fluids.  I did obtain a CT scan of her abdomen pelvis which did  not show any acute abnormalities.  UA without findings concerning for infection.  At this time somewhat unclear etiology of the patient's pain, however she did feel improvement.  She was able to tolerate p.o. here in the emergency department.  I do think would be reasonable for patient be discharged. Discussed return precautions.    FINAL CLINICAL IMPRESSION(S) / ED DIAGNOSES   Final diagnoses:  Flank pain     Note:  This document was prepared using Dragon voice recognition software and may include unintentional dictation errors.    Marylynn Soho, MD 11/12/23 [phone]

## 2023-11-12 NOTE — ED Notes (Signed)
 Going to obtain VS again including temp then notify EDP then EDP will decide if need second set cultures.

## 2023-11-12 NOTE — ED Triage Notes (Signed)
 Pt sts that she has been having back pain on the left side. Pt sts that she feels like she has a kidney stone which she has had an extensive hx of. Pt sts that she has been running fevers as high as 105 orally.

## 2023-11-14 ENCOUNTER — Ambulatory Visit: Admitting: Nurse Practitioner

## 2023-11-14 ENCOUNTER — Encounter: Payer: Self-pay | Admitting: Nurse Practitioner

## 2023-11-14 VITALS — BP 132/76 | HR 90 | Temp 98.3°F | Resp 16 | Ht 63.0 in | Wt 199.0 lb

## 2023-11-14 DIAGNOSIS — E559 Vitamin D deficiency, unspecified: Secondary | ICD-10-CM

## 2023-11-14 DIAGNOSIS — F40243 Fear of flying: Secondary | ICD-10-CM

## 2023-11-14 DIAGNOSIS — K121 Other forms of stomatitis: Secondary | ICD-10-CM

## 2023-11-14 MED ORDER — DIAZEPAM 5 MG PO TABS: 5.0000 mg | ORAL_TABLET | Freq: Once | ORAL | 0 refills | Status: DC | PRN

## 2023-11-14 MED ORDER — VITAMIN D (ERGOCALCIFEROL) 1.25 MG (50000 UNIT) PO CAPS: 50000.0000 [IU] | ORAL_CAPSULE | ORAL | 3 refills | Status: DC

## 2023-11-14 MED ORDER — NYSTATIN 100000 UNIT/ML MT SUSP: 5.0000 mL | Freq: Four times a day (QID) | OROMUCOSAL | 2 refills | Status: DC | PRN

## 2023-11-14 NOTE — Progress Notes (Signed)
 [facility] [address] [place], [place] [address]  Internal MEDICINE  Office Visit Note  Patient Name: Joan Johnson  [id number]  [id number]  Date of Service: 11/14/2023  Chief Complaint  Patient presents with   Follow-up    ER f/u     HPI Joan Johnson presents for a follow-up visit for anxiety with flying, recent ED visit, possible URI.  ED follow up  -- lactic acid was elecvated initially but improved. Urinalysis was negative Refused covid test Hsa a few sore spots in her mouth, has chlorhexidine  mouthwash at home. Is going out of town by plane tomorrow to [place] , needs something for anxiety on the flight.     Current Medication: Outpatient Encounter Medications as of 11/14/2023  Medication Sig Note   calcium  carbonate (OSCAL) 1500 (600 Ca) MG TABS tablet Take 600 mg of elemental calcium  by mouth daily.    diazepam  (VALIUM ) 5 MG tablet Take 1 tablet (5 mg total) by mouth once as needed for up to 1 dose for anxiety. Take at least 1 hour before flight.    EPINEPHrine  0.3 mg/0.3 mL IJ SOAJ injection Inject 0.3 mg into the muscle as needed for anaphylaxis. 09/20/2023: prn   Estradiol  (IMVEXXY  MAINTENANCE PACK) 10 MCG INST Place vaginally. Insert 1 supp vaginally 2 times a week    HYDROcodone -acetaminophen  (NORCO/VICODIN) 5-325 MG tablet Take 1-2 tablets by mouth every 6 (six) hours as needed for moderate pain (pain score 4-6). 09/20/2023: As per Patient, takes every other day   hydroxychloroquine (PLAQUENIL) 200 MG tablet Take 200 mg by mouth daily.    losartan  (COZAAR ) 50 MG tablet Take 1 tablet (50 mg total) by mouth daily.    magic mouthwash (nystatin , lidocaine , diphenhydrAMINE ) suspension Take 5 mLs by mouth 4 (four) times daily as needed for mouth pain.    oxybutynin  (DITROPAN ) 5 MG tablet Take 1 tablet (5 mg total) by mouth every 8 (eight) hours as needed for bladder spasms. 09/20/2023: prn   tamsulosin  (FLOMAX ) 0.4 MG CAPS capsule TAKE 1 CAPSULE(0.4 MG) BY MOUTH  DAILY    Vibegron  (GEMTESA ) 75 MG TABS Take 1 tablet (75 mg total) by mouth daily.    [DISCONTINUED] Vitamin D , Ergocalciferol , (DRISDOL ) 1.25 MG (50000 UNIT) CAPS capsule Take 1 capsule (50,000 Units total) by mouth every 7 (seven) days.    Vitamin D , Ergocalciferol , (DRISDOL ) 1.25 MG (50000 UNIT) CAPS capsule Take 1 capsule (50,000 Units total) by mouth every 7 (seven) days.    No facility-administered encounter medications on file as of 11/14/2023.    Surgical History: Past Surgical History:  Procedure Laterality Date   ANKLE SURGERY     AUGMENTATION MAMMAPLASTY     Breast Implant   CESAREAN SECTION     1991, 1989, 1987   COLONOSCOPY WITH PROPOFOL  N/A 10/01/2020   Procedure: COLONOSCOPY WITH PROPOFOL ;  Surgeon: Selena Daily, MD;  Location: [facility];  Service: Gastroenterology;  Laterality: N/A;   COSMETIC SURGERY     CYSTOSCOPY W/ RETROGRADES Left 07/19/2021   Procedure: CYSTOSCOPY WITH RETROGRADE PYELOGRAM;  Surgeon: Dustin Gimenez, MD;  Location: [facility];  Service: Urology;  Laterality: Left;   CYSTOSCOPY/URETEROSCOPY/HOLMIUM LASER/STENT PLACEMENT Left 07/19/2021   Procedure: CYSTOSCOPY/URETEROSCOPY/HOLMIUM LASER/STENT PLACEMENT;  Surgeon: Dustin Gimenez, MD;  Location: [facility];  Service: Urology;  Laterality: Left;   CYSTOSCOPY/URETEROSCOPY/HOLMIUM LASER/STENT PLACEMENT Left 09/18/2023   Procedure: CYSTOSCOPY/URETEROSCOPY/HOLMIUM LASER/STENT PLACEMENT;  Surgeon: Dustin Gimenez, MD;  Location: [facility];  Service: Urology;  Laterality: Left;   dental implants  ESOPHAGOGASTRODUODENOSCOPY (EGD) WITH PROPOFOL  N/A 10/01/2020   Procedure: ESOPHAGOGASTRODUODENOSCOPY (EGD) WITH PROPOFOL ;  Surgeon: Selena Daily, MD;  Location: [facility];  Service: Gastroenterology;  Laterality: N/A;   HERNIA REPAIR     OTHER SURGICAL HISTORY     tummy tuck   OTHER SURGICAL HISTORY     carpal tunnel   TUBAL LIGATION      Medical History: Past Medical History:  Diagnosis  Date   Allergy    Anxiety    just when traveling   Arthritis    GERD (gastroesophageal reflux disease)    history   History of kidney stones    Hypertension    Left nephrolithiasis    Migraine    Osteopenia 06/2021   DEXA at [facility]; spine and hip   Osteopenia after menopause 09/2023   hip and spine; DEXA at [facility]; FRAX: mj=5.9%/hip=0.5%   Pneumonia    Premature menopause    in 30s   Undifferentiated connective tissue disease (HCC)     Family History: Family History  Problem Relation Age of Onset   Heart murmur Mother    Thyroid disease Daughter    Breast cancer Neg Hx    Ovarian cancer Neg Hx     Social History   Socioeconomic History   Marital status: Married    Spouse name: Luis   Number of children: 2   Years of education: Not on file   Highest education level: 12th grade  Occupational History   Not on file  Tobacco Use   Smoking status: Former    Current packs/day: 0.00    Average packs/day: 0.5 packs/day for 21.0 years (10.5 ttl pk-yrs)    Types: Cigarettes    Start date: 03/29/1997    Quit date: 03/29/2018    Years since quitting: 5.6    Passive exposure: Past   Smokeless tobacco: Never  Vaping Use   Vaping status: Never Used  Substance and Sexual Activity   Alcohol use: Yes    Alcohol/week: 2.0 standard drinks of alcohol    Types: 2 Glasses of wine per week    Comment: weekly   Drug use: Never   Sexual activity: Yes    Birth control/protection: Post-menopausal  Other Topics Concern   Not on file  Social History Narrative   ** Merged History Encounter **       Social Drivers of Health   Financial Resource Strain: Low Risk  (10/19/2022)   Overall Financial Resource Strain (CARDIA)    Difficulty of Paying Living Expenses: Not hard at all  Food Insecurity: No Food Insecurity (09/20/2023)   Hunger Vital Sign    Worried About Running Out of Food in the Last Year: Never true    Ran Out of Food in the Last Year: Never true  Transportation Needs: No  Transportation Needs (09/20/2023)   PRAPARE - Administrator, Civil Service (Medical): No    Lack of Transportation (Non-Medical): No  Physical Activity: Not on file  Stress: No Stress Concern Present (10/19/2022)   [employer] of Occupational Health - Occupational Stress Questionnaire    Feeling of Stress : Not at all  Social Connections: Moderately Isolated (09/20/2023)   Social Connection and Isolation Panel [NHANES]    Frequency of Communication with Friends and Family: More than three times a week    Frequency of Social Gatherings with Friends and Family: More than three times a week    Attends Religious Services: Never    Active Member  of Clubs or Organizations: No    Attends Banker Meetings: Never    Marital Status: Married  Catering manager Violence: Not At Risk (09/20/2023)   Humiliation, Afraid, Rape, and Kick questionnaire    Fear of Current or Ex-Partner: No    Emotionally Abused: No    Physically Abused: No    Sexually Abused: No      Review of Systems  Constitutional:  Positive for unexpected weight change. Negative for chills and fatigue.  HENT:  Negative for congestion, rhinorrhea, sneezing and sore throat.   Eyes:  Negative for redness.  Respiratory:  Negative for cough, chest tightness and shortness of breath.   Cardiovascular:  Negative for chest pain and palpitations.  Gastrointestinal:  Negative for abdominal pain, constipation, diarrhea, nausea and vomiting.  Genitourinary:  Negative for dysuria and frequency.  Musculoskeletal:  Negative for arthralgias, back pain, joint swelling and neck pain.  Skin:  Negative for rash.  Neurological: Negative.  Negative for tremors and numbness.  Hematological:  Negative for adenopathy. Does not bruise/bleed easily.  Psychiatric/Behavioral:  Negative for behavioral problems (Depression), sleep disturbance and suicidal ideas. The patient is not nervous/anxious.     Vital Signs: BP 132/76    Pulse 90   Temp 98.3 F (36.8 C)   Resp 16   Ht 5\' 3"  (1.6 m)   Wt 199 lb (90.3 kg)   SpO2 97%   BMI 35.25 kg/m    Physical Exam Vitals reviewed.  Constitutional:      General: She is not in acute distress.    Appearance: Normal appearance. She is obese. She is not ill-appearing.  HENT:     Head: Normocephalic and atraumatic.  Eyes:     Pupils: Pupils are equal, round, and reactive to light.  Cardiovascular:     Rate and Rhythm: Normal rate and regular rhythm.  Pulmonary:     Effort: Pulmonary effort is normal. No respiratory distress.  Neurological:     Mental Status: She is alert and oriented to person, place, and time.  Psychiatric:        Mood and Affect: Mood normal.        Behavior: Behavior normal.        Assessment/Plan: 1. Mouth ulcer (Primary) Magic mouth wash prescribed. Take as ordered  - magic mouthwash (nystatin , lidocaine , diphenhydrAMINE ) suspension; Take 5 mLs by mouth 4 (four) times daily as needed for mouth pain.  Dispense: 180 mL; Refill: 2  2. Vitamin D  deficiency Continue weekly vitamin D  supplement as prescribed  - Vitamin D , Ergocalciferol , (DRISDOL ) 1.25 MG (50000 UNIT) CAPS capsule; Take 1 capsule (50,000 Units total) by mouth every 7 (seven) days.  Dispense: 4 capsule; Refill: 3  3. Anxiety with flying Take 1 valium  for each flight 30 min to 1 hour before take off.  - diazepam  (VALIUM ) 5 MG tablet; Take 1 tablet (5 mg total) by mouth once as needed for up to 1 dose for anxiety. Take at least 1 hour before flight.  Dispense: 2 tablet; Refill: 0   General Counseling: Kanda verbalizes understanding of the findings of todays visit and agrees with plan of treatment. I have discussed any further diagnostic evaluation that may be needed or ordered today. We also reviewed her medications today. she has been encouraged to call the office with any questions or concerns that should arise related to todays visit.    No orders of the defined types  were placed in this encounter.   Meds ordered  this encounter  Medications   magic mouthwash (nystatin , lidocaine , diphenhydrAMINE ) suspension    Sig: Take 5 mLs by mouth 4 (four) times daily as needed for mouth pain.    Dispense:  180 mL    Refill:  2    Please fill this script -- use 60ml lidocaine , 60 ml nystatin  and 60 ml benadryl   thanks fill today   diazepam  (VALIUM ) 5 MG tablet    Sig: Take 1 tablet (5 mg total) by mouth once as needed for up to 1 dose for anxiety. Take at least 1 hour before flight.    Dispense:  2 tablet    Refill:  0   Vitamin D , Ergocalciferol , (DRISDOL ) 1.25 MG (50000 UNIT) CAPS capsule    Sig: Take 1 capsule (50,000 Units total) by mouth every 7 (seven) days.    Dispense:  4 capsule    Refill:  3    ZERO refills remain on this prescription. Your patient is requesting advance approval of refills for this medication to PREVENT ANY MISSED DOSES    Return for keep regular scheduled follow up.   Total time spent:30 Minutes Time spent includes review of chart, medications, test results, and follow up plan with the patient.   [place] Controlled Substance Database was reviewed by me.  This patient was seen by Laurence Pons, FNP-C in collaboration with Dr. Verneta Gone as a part of collaborative care agreement.   Wlliam Grosso R. Bobbi Burow, MSN, FNP-C Internal medicine

## 2023-11-17 LAB — CULTURE, BLOOD (ROUTINE X 2)
Culture: NO GROWTH
Culture: NO GROWTH

## 2023-11-21 ENCOUNTER — Ambulatory Visit
Admission: RE | Admit: 2023-11-21 | Discharge: 2023-11-21 | Disposition: A | Discharge status: Home / Self Care | Source: Ambulatory Visit | Attending: Urology | Admitting: Urology

## 2023-11-21 DIAGNOSIS — N200 Calculus of kidney: Secondary | ICD-10-CM | POA: Diagnosis present

## 2023-11-21 DIAGNOSIS — Z466 Encounter for fitting and adjustment of urinary device: Secondary | ICD-10-CM | POA: Diagnosis present

## 2023-11-29 ENCOUNTER — Ambulatory Visit: Admitting: Urology

## 2023-12-05 ENCOUNTER — Ambulatory Visit: Admitting: Urology

## 2023-12-05 VITALS — BP 125/75 | HR 76 | Ht 63.0 in | Wt 203.5 lb

## 2023-12-05 DIAGNOSIS — N200 Calculus of kidney: Secondary | ICD-10-CM

## 2023-12-05 NOTE — Patient Instructions (Signed)

## 2023-12-05 NOTE — Progress Notes (Signed)
 I,Amy L Pierron,acting as a scribe for Dustin Gimenez, MD.,have documented all relevant documentation on the behalf of Dustin Gimenez, MD,as directed by  Dustin Gimenez, MD while in the presence of Dustin Gimenez, MD.  12/05/2023 4:05 PM   Joan Johnson 06-27-1971 [id number]  Referring provider: Laurence Pons, NP [address] [place],  [place] [address]  Chief Complaint  Patient presents with   Follow-up    HPI: 53 year-old female with a personal history of kidney stones presents today for follow-up.   She previously underwent left ureteroscopy for a small cluster of upper pole stones, during which a proximal ureter mucosal injury occurred, necessitating the placement of a stent. The stent was left in place for four weeks and was removed on 10/19/2023. She experienced difficulty tolerating the stent.   A renal ultrasound performed on 11/21/2023 showed no hydronephrosis or stone burden.  She reports feeling back to normal and is curious about doing the 24 hour urine and what types of lettuce she should avoid.  PMH: Past Medical History:  Diagnosis Date   Allergy    Anxiety    just when traveling   Arthritis    GERD (gastroesophageal reflux disease)    history   History of kidney stones    Hypertension    Left nephrolithiasis    Migraine    Osteopenia 06/2021   DEXA at [facility]; spine and hip   Osteopenia after menopause 09/2023   hip and spine; DEXA at [facility]; FRAX: mj=5.9%/hip=0.5%   Pneumonia    Premature menopause    in 30s   Undifferentiated connective tissue disease (HCC)     Surgical History: Past Surgical History:  Procedure Laterality Date   ANKLE SURGERY     AUGMENTATION MAMMAPLASTY     Breast Implant   CESAREAN SECTION     1991, 1989, 1987   COLONOSCOPY WITH PROPOFOL  N/A 10/01/2020   Procedure: COLONOSCOPY WITH PROPOFOL ;  Surgeon: Selena Daily, MD;  Location: [facility];  Service: Gastroenterology;  Laterality: N/A;   COSMETIC SURGERY      CYSTOSCOPY W/ RETROGRADES Left 07/19/2021   Procedure: CYSTOSCOPY WITH RETROGRADE PYELOGRAM;  Surgeon: Dustin Gimenez, MD;  Location: [facility];  Service: Urology;  Laterality: Left;   CYSTOSCOPY/URETEROSCOPY/HOLMIUM LASER/STENT PLACEMENT Left 07/19/2021   Procedure: CYSTOSCOPY/URETEROSCOPY/HOLMIUM LASER/STENT PLACEMENT;  Surgeon: Dustin Gimenez, MD;  Location: [facility];  Service: Urology;  Laterality: Left;   CYSTOSCOPY/URETEROSCOPY/HOLMIUM LASER/STENT PLACEMENT Left 09/18/2023   Procedure: CYSTOSCOPY/URETEROSCOPY/HOLMIUM LASER/STENT PLACEMENT;  Surgeon: Dustin Gimenez, MD;  Location: [facility];  Service: Urology;  Laterality: Left;   dental implants     ESOPHAGOGASTRODUODENOSCOPY (EGD) WITH PROPOFOL  N/A 10/01/2020   Procedure: ESOPHAGOGASTRODUODENOSCOPY (EGD) WITH PROPOFOL ;  Surgeon: Selena Daily, MD;  Location: [facility];  Service: Gastroenterology;  Laterality: N/A;   HERNIA REPAIR     OTHER SURGICAL HISTORY     tummy tuck   OTHER SURGICAL HISTORY     carpal tunnel   TUBAL LIGATION      Home Medications:  Allergies as of 12/05/2023       Reactions   Codeine Itching, Other (See Comments)   Ivp Dye [iodinated Contrast Media] Hives   Shellfish Allergy Hives        Medication List        Accurate as of Dec 05, 2023  4:05 PM. If you have any questions, ask your nurse or doctor.          STOP taking these medications    Gemtesa  75 MG  Tabs Generic drug: Vibegron    oxybutynin  5 MG tablet Commonly known as: DITROPAN    tamsulosin  0.4 MG Caps capsule Commonly known as: FLOMAX        TAKE these medications    calcium  carbonate 1500 (600 Ca) MG Tabs tablet Commonly known as: OSCAL Take 600 mg of elemental calcium  by mouth daily.   diazepam  5 MG tablet Commonly known as: VALIUM  Take 1 tablet (5 mg total) by mouth once as needed for up to 1 dose for anxiety. Take at least 1 hour before flight.   EPINEPHrine  0.3 mg/0.3 mL Soaj injection Commonly known as:  EPI-PEN Inject 0.3 mg into the muscle as needed for anaphylaxis.   HYDROcodone -acetaminophen  5-325 MG tablet Commonly known as: NORCO/VICODIN Take 1-2 tablets by mouth every 6 (six) hours as needed for moderate pain (pain score 4-6).   hydroxychloroquine 200 MG tablet Commonly known as: PLAQUENIL Take 200 mg by mouth daily.   Imvexxy  Maintenance Pack 10 MCG Inst Generic drug: Estradiol  Place vaginally. Insert 1 supp vaginally 2 times a week   losartan  50 MG tablet Commonly known as: COZAAR  Take 1 tablet (50 mg total) by mouth daily.   magic mouthwash (nystatin , lidocaine , diphenhydrAMINE ) suspension Take 5 mLs by mouth 4 (four) times daily as needed for mouth pain.   Vitamin D  (Ergocalciferol ) 1.25 MG (50000 UNIT) Caps capsule Commonly known as: DRISDOL  Take 1 capsule (50,000 Units total) by mouth every 7 (seven) days.        Allergies:  Allergies  Allergen Reactions   Codeine Itching and Other (See Comments)   Ivp Dye [Iodinated Contrast Media] Hives   Shellfish Allergy Hives    Family History: Family History  Problem Relation Age of Onset   Heart murmur Mother    Thyroid disease Daughter    Breast cancer Neg Hx    Ovarian cancer Neg Hx     Social History:  reports that she quit smoking about 5 years ago. Her smoking use included cigarettes. She started smoking about 26 years ago. She has a 10.5 pack-year smoking history. She has been exposed to tobacco smoke. She has never used smokeless tobacco. She reports current alcohol use of about 2.0 standard drinks of alcohol per week. She reports that she does not use drugs.   Physical Exam: BP 125/75   Pulse 76   Ht 5\' 3"  (1.6 m)   Wt 203 lb 8 oz (92.3 kg)   BMI 36.05 kg/m   Constitutional:  Alert and oriented, No acute distress. HEENT: [place] AT, moist mucus membranes.  Trachea midline, no masses. Neurologic: Grossly intact, no focal deficits, moving all 4 extremities. Psychiatric: Normal mood and  affect.   Pertinent Imaging:  Results for orders placed during the hospital encounter of 11/21/23  Ultrasound renal complete  Narrative EXAM: RETROPERITONEAL ULTRASOUND OF THE KIDNEYS AND URINARY BLADDER  TECHNIQUE: Real-time ultrasonography of the retroperitoneum including the kidneys and urinary bladder was performed.  COMPARISON: Comparison CT abdomen/pelvis dated 11/12/2023.  CLINICAL HISTORY: Kidney stone follow-up, status post ureteral stent removal.  FINDINGS:  RIGHT KIDNEY: The right kidney measures 10.3 x 4.3 x 4.8 cm (calculated volume 113 ml). The right kidney demonstrates normal cortical echogenicity. No evidence of hydronephrosis or intrarenal stones.  LEFT KIDNEY: The left kidney measures 11.4 x 5.4 x 5.1 cm (calculated volume 165 ml). The left kidney demonstrates normal cortical echogenicity. No evidence of hydronephrosis or intrarenal stones.  BLADDER: Unremarkable appearance of the bladder.  IMPRESSION: 1. No evidence of nephrolithiasis or  hydronephrosis.  Electronically signed by: Zadie Herter MD 11/22/2023 12:52 AM EDT RP Workstation: [id number] Personally reviewed the above images and agree with radiologic interpretation.  Assessment & Plan:    1. History of kidney stones - Status post ureteroscopy. Currently stone-free as confirmed by renal ultrasound. A 24-hour urine collection will be ordered to assess for any metabolic abnormalities contributing to stone formation. Instructed on how to collect the urine sample and will have bloodwork scheduled concurrently. Results will be reviewed to determine any necessary dietary or medical interventions. - Advised to avoid high-oxalate foods such as spinach, collards, and chard, as her stones are likely calcium  oxalate in nature. She was informed that iceberg, romaine, and kale are acceptable.  - Annual follow-up with an X-ray is recommended.  Return in about 6 weeks (around 01/16/2024) for 24-hour  urine collection results and further management..  I have reviewed the above documentation for accuracy and completeness, and I agree with the above.   Dustin Gimenez, MD  [facility] [address] [place], [place] [address] [phone]

## 2023-12-17 ENCOUNTER — Encounter: Payer: Self-pay | Admitting: Nurse Practitioner

## 2023-12-22 ENCOUNTER — Other Ambulatory Visit

## 2023-12-22 DIAGNOSIS — N200 Calculus of kidney: Secondary | ICD-10-CM

## 2023-12-23 LAB — LITHOLINK SERUM PANEL
CO2: 21 mmol/L (ref 20–29)
Calcium: 9.6 mg/dL (ref 8.7–10.2)
Chloride: 103 mmol/L (ref 96–106)
Creatinine, Ser: 0.65 mg/dL (ref 0.57–1.00)
Magnesium: 1.8 mg/dL (ref 1.6–2.3)
Phosphorus: 4.1 mg/dL (ref 3.0–4.3)
Potassium: 4.3 mmol/L (ref 3.5–5.2)
Sodium: 141 mmol/L (ref 134–144)
Uric Acid: 4.1 mg/dL (ref 3.0–7.2)
eGFR: 105 mL/min/{1.73_m2} (ref >59)

## 2023-12-27 ENCOUNTER — Ambulatory Visit (INDEPENDENT_AMBULATORY_CARE_PROVIDER_SITE_OTHER): Payer: Commercial Managed Care - PPO | Admitting: Nurse Practitioner

## 2023-12-27 ENCOUNTER — Encounter: Payer: Self-pay | Admitting: Nurse Practitioner

## 2023-12-27 VITALS — BP 135/75 | HR 70 | Temp 98.4°F | Resp 16 | Ht 63.0 in | Wt 210.0 lb

## 2023-12-27 DIAGNOSIS — Z6835 Body mass index (BMI) 35.0-35.9, adult: Secondary | ICD-10-CM

## 2023-12-27 DIAGNOSIS — I10 Essential (primary) hypertension: Secondary | ICD-10-CM | POA: Diagnosis not present

## 2023-12-27 DIAGNOSIS — E6609 Other obesity due to excess calories: Secondary | ICD-10-CM

## 2023-12-27 DIAGNOSIS — N200 Calculus of kidney: Secondary | ICD-10-CM

## 2023-12-27 DIAGNOSIS — E66812 Obesity, class 2: Secondary | ICD-10-CM

## 2023-12-27 DIAGNOSIS — E538 Deficiency of other specified B group vitamins: Secondary | ICD-10-CM | POA: Diagnosis not present

## 2023-12-27 DIAGNOSIS — E782 Mixed hyperlipidemia: Secondary | ICD-10-CM

## 2023-12-27 DIAGNOSIS — E559 Vitamin D deficiency, unspecified: Secondary | ICD-10-CM

## 2023-12-27 NOTE — Progress Notes (Signed)
 [facility] [address] [place], [place] [address]  Internal MEDICINE  Office Visit Note  Patient Name: Joan Johnson  [id number]  [id number]  Date of Service: 12/27/2023  Chief Complaint  Patient presents with   Follow-up   Gastroesophageal Reflux   Hypertension    HPI Taquisha presents for a follow-up visit for weight loss, kidney stones, vitamin D  and hypertension.  Weight loss -- gained 11 lbs since April  Kidney stones -- seeing urology. Does not have any kidney stones presents.  Vitamin D  -- taking weekly supplement  Hypertension -- taking losartan  but wants to stop taking it. Wants to try more natural supplements.     Current Medication: Outpatient Encounter Medications as of 12/27/2023  Medication Sig Note   calcium  carbonate (OSCAL) 1500 (600 Ca) MG TABS tablet Take 600 mg of elemental calcium  by mouth daily.    diazepam  (VALIUM ) 5 MG tablet Take 1 tablet (5 mg total) by mouth once as needed for up to 1 dose for anxiety. Take at least 1 hour before flight.    EPINEPHrine  0.3 mg/0.3 mL IJ SOAJ injection Inject 0.3 mg into the muscle as needed for anaphylaxis. 09/20/2023: prn   Estradiol  (IMVEXXY  MAINTENANCE PACK) 10 MCG INST Place vaginally. Insert 1 supp vaginally 2 times a week    HYDROcodone -acetaminophen  (NORCO/VICODIN) 5-325 MG tablet Take 1-2 tablets by mouth every 6 (six) hours as needed for moderate pain (pain score 4-6). 09/20/2023: As per Patient, takes every other day   hydroxychloroquine (PLAQUENIL) 200 MG tablet Take 200 mg by mouth daily.    losartan  (COZAAR ) 50 MG tablet Take 1 tablet (50 mg total) by mouth daily.    magic mouthwash (nystatin , lidocaine , diphenhydrAMINE ) suspension Take 5 mLs by mouth 4 (four) times daily as needed for mouth pain.    Vitamin D , Ergocalciferol , (DRISDOL ) 1.25 MG (50000 UNIT) CAPS capsule Take 1 capsule (50,000 Units total) by mouth every 7 (seven) days.    No facility-administered encounter medications on file  as of 12/27/2023.    Surgical History: Past Surgical History:  Procedure Laterality Date   ANKLE SURGERY     AUGMENTATION MAMMAPLASTY     Breast Implant   CESAREAN SECTION     1991, 1989, 1987   COLONOSCOPY WITH PROPOFOL  N/A 10/01/2020   Procedure: COLONOSCOPY WITH PROPOFOL ;  Surgeon: Unk Corinn Skiff, MD;  Location: [facility];  Service: Gastroenterology;  Laterality: N/A;   COSMETIC SURGERY     CYSTOSCOPY W/ RETROGRADES Left 07/19/2021   Procedure: CYSTOSCOPY WITH RETROGRADE PYELOGRAM;  Surgeon: Penne Knee, MD;  Location: [facility];  Service: Urology;  Laterality: Left;   CYSTOSCOPY/URETEROSCOPY/HOLMIUM LASER/STENT PLACEMENT Left 07/19/2021   Procedure: CYSTOSCOPY/URETEROSCOPY/HOLMIUM LASER/STENT PLACEMENT;  Surgeon: Penne Knee, MD;  Location: [facility];  Service: Urology;  Laterality: Left;   CYSTOSCOPY/URETEROSCOPY/HOLMIUM LASER/STENT PLACEMENT Left 09/18/2023   Procedure: CYSTOSCOPY/URETEROSCOPY/HOLMIUM LASER/STENT PLACEMENT;  Surgeon: Penne Knee, MD;  Location: [facility];  Service: Urology;  Laterality: Left;   dental implants     ESOPHAGOGASTRODUODENOSCOPY (EGD) WITH PROPOFOL  N/A 10/01/2020   Procedure: ESOPHAGOGASTRODUODENOSCOPY (EGD) WITH PROPOFOL ;  Surgeon: Unk Corinn Skiff, MD;  Location: [facility];  Service: Gastroenterology;  Laterality: N/A;   HERNIA REPAIR     OTHER SURGICAL HISTORY     tummy tuck   OTHER SURGICAL HISTORY     carpal tunnel   TUBAL LIGATION      Medical History: Past Medical History:  Diagnosis Date   Allergy    Anxiety    just when  traveling   Arthritis    GERD (gastroesophageal reflux disease)    history   History of kidney stones    Hypertension    Left nephrolithiasis    Migraine    Osteopenia 06/2021   DEXA at [facility]; spine and hip   Osteopenia after menopause 09/2023   hip and spine; DEXA at [facility]; FRAX: mj=5.9%/hip=0.5%   Pneumonia    Premature menopause    in 30s   Undifferentiated connective tissue disease  (HCC)     Family History: Family History  Problem Relation Age of Onset   Heart murmur Mother    Thyroid disease Daughter    Breast cancer Neg Hx    Ovarian cancer Neg Hx     Social History   Socioeconomic History   Marital status: Married    Spouse name: Luis   Number of children: 2   Years of education: Not on file   Highest education level: 12th grade  Occupational History   Not on file  Tobacco Use   Smoking status: Former    Current packs/day: 0.00    Average packs/day: 0.5 packs/day for 21.0 years (10.5 ttl pk-yrs)    Types: Cigarettes    Start date: 03/29/1997    Quit date: 03/29/2018    Years since quitting: 5.7    Passive exposure: Past   Smokeless tobacco: Never  Vaping Use   Vaping status: Never Used  Substance and Sexual Activity   Alcohol use: Yes    Alcohol/week: 2.0 standard drinks of alcohol    Types: 2 Glasses of wine per week    Comment: weekly   Drug use: Never   Sexual activity: Yes    Birth control/protection: Post-menopausal  Other Topics Concern   Not on file  Social History Narrative   ** Merged History Encounter **       Social Drivers of Health   Financial Resource Strain: Low Risk  (10/19/2022)   Overall Financial Resource Strain (CARDIA)    Difficulty of Paying Living Expenses: Not hard at all  Food Insecurity: No Food Insecurity (09/20/2023)   Hunger Vital Sign    Worried About Running Out of Food in the Last Year: Never true    Ran Out of Food in the Last Year: Never true  Transportation Needs: No Transportation Needs (09/20/2023)   PRAPARE - Administrator, Civil Service (Medical): No    Lack of Transportation (Non-Medical): No  Physical Activity: Not on file  Stress: No Stress Concern Present (10/19/2022)   [employer] of Occupational Health - Occupational Stress Questionnaire    Feeling of Stress : Not at all  Social Connections: Moderately Isolated (09/20/2023)   Social Connection and Isolation Panel  [NHANES]    Frequency of Communication with Friends and Family: More than three times a week    Frequency of Social Gatherings with Friends and Family: More than three times a week    Attends Religious Services: Never    Database administrator or Organizations: No    Attends Banker Meetings: Never    Marital Status: Married  Catering manager Violence: Not At Risk (09/20/2023)   Humiliation, Afraid, Rape, and Kick questionnaire    Fear of Current or Ex-Partner: No    Emotionally Abused: No    Physically Abused: No    Sexually Abused: No      Review of Systems  Constitutional:  Positive for unexpected weight change. Negative for chills and fatigue.  HENT:  Negative for congestion, rhinorrhea, sneezing and sore throat.   Eyes:  Negative for redness.  Respiratory:  Negative for cough, chest tightness and shortness of breath.   Cardiovascular:  Negative for chest pain and palpitations.  Gastrointestinal:  Negative for abdominal pain, constipation, diarrhea, nausea and vomiting.  Genitourinary:  Negative for dysuria and frequency.  Musculoskeletal:  Negative for arthralgias, back pain, joint swelling and neck pain.  Skin:  Negative for rash.  Neurological: Negative.  Negative for tremors and numbness.  Hematological:  Negative for adenopathy. Does not bruise/bleed easily.  Psychiatric/Behavioral:  Negative for behavioral problems (Depression), sleep disturbance and suicidal ideas. The patient is not nervous/anxious.     Vital Signs: BP 135/75 Comment: 141/95  Pulse 70   Temp 98.4 F (36.9 C)   Resp 16   Ht 5' 3 (1.6 m)   Wt 210 lb (95.3 kg)   SpO2 99%   BMI 37.20 kg/m    Physical Exam Vitals reviewed.  Constitutional:      General: She is not in acute distress.    Appearance: Normal appearance. She is obese. She is not ill-appearing.  HENT:     Head: Normocephalic and atraumatic.  Eyes:     Pupils: Pupils are equal, round, and reactive to light.   Cardiovascular:     Rate and Rhythm: Normal rate and regular rhythm.  Pulmonary:     Effort: Pulmonary effort is normal. No respiratory distress.  Neurological:     Mental Status: She is alert and oriented to person, place, and time.  Psychiatric:        Mood and Affect: Mood normal.        Behavior: Behavior normal.        Assessment/Plan: 1. Primary hypertension (Primary) Stable continue losartan  as prescribed.   2. Mixed hyperlipidemia Not currently on any statin therapy, encouraged patient to limit red meat intake and increase lean proteins in diet.   3. Kidney stones No issues since her hospital stay.   4. B12 deficiency Gets monthly B12 injections   5. Vitamin D  deficiency Continue weekly vitamin D  supplement.   6. Class 2 obesity due to excess calories without serious comorbidity with body mass index (BMI) of 35.0 to 35.9 in adult Gained some weight, not currently taking any medications to help with weight loss. Will discuss at future visit when patient is feeling better.    General Counseling: kijuana ruppel understanding of the findings of todays visit and agrees with plan of treatment. I have discussed any further diagnostic evaluation that may be needed or ordered today. We also reviewed her medications today. she has been encouraged to call the office with any questions or concerns that should arise related to todays visit.    No orders of the defined types were placed in this encounter.   No orders of the defined types were placed in this encounter.   Return in about 3 months (around 03/28/2024) for F/U, Jayley Hustead PCP.   Total time spent:30 Minutes Time spent includes review of chart, medications, test results, and follow up plan with the patient.   [place] Controlled Substance Database was reviewed by me.  This patient was seen by Mardy Maxin, FNP-C in collaboration with Dr. Sigrid Bathe as a part of collaborative care agreement.   Pegah Segel R.  Maxin, MSN, FNP-C Internal medicine

## 2023-12-28 ENCOUNTER — Ambulatory Visit: Payer: Self-pay | Admitting: Urology

## 2023-12-29 LAB — LITHOLINK 24HR URINE PANEL
Ammonium, Urine: 14 mmol/(24.h) — ABNORMAL LOW (ref 15–60)
Calcium Oxalate Saturation: 13.37 — ABNORMAL HIGH (ref 6.00–10.00)
Calcium Phosphate Saturation: 4.73 — ABNORMAL HIGH (ref 0.50–2.00)
Calcium, Urine: 137 mg/(24.h) (ref <200)
Calcium/Creatinine Ratio: 162 mg/g{creat} (ref 51–262)
Calcium/Kg Body Weight: 1.5 mg/kg/d (ref <4.0)
Chloride, Urine: 83 mmol/(24.h) (ref 70–250)
Citrate, Urine: 666 mg/(24.h) (ref >550)
Creatinine, Urine: 843 mg/(24.h)
Creatinine/Kg Body Weight: 9.3 mg/kg/d (ref 8.7–20.3)
Cystine, Urine, Qualitative: NEGATIVE
Magnesium, Urine: 67 mg/(24.h) (ref 30–120)
Oxalate, Urine: 27 mg/(24.h) (ref 20–40)
Phosphorus, Urine: 568 mg/(24.h) — ABNORMAL LOW (ref 600–1200)
Potassium, Urine: 42 mmol/(24.h) (ref 20–100)
Protein Catabolic Rate: 0.5 g/kg/d — ABNORMAL LOW (ref 0.8–1.4)
Sodium, Urine: 100 mmol/(24.h) (ref 50–150)
Sulfate, Urine: 19 meq/(24.h) — ABNORMAL LOW (ref 20–80)
Urea Nitrogen, Urine: 3.87 g/(24.h) — ABNORMAL LOW (ref 6.00–14.00)
Uric Acid Saturation: 0.2 (ref <1.00)
Uric Acid, Urine: 355 mg/(24.h) (ref <750)
Urine Volume (Preserved): 680 mL/(24.h) (ref 500–4000)
pH, 24 hr, Urine: 6.766 — ABNORMAL HIGH (ref 5.800–6.200)

## 2024-01-16 ENCOUNTER — Ambulatory Visit: Admitting: Urology

## 2024-01-19 ENCOUNTER — Ambulatory Visit: Admitting: Physician Assistant

## 2024-01-26 ENCOUNTER — Ambulatory Visit: Admitting: Physician Assistant

## 2024-02-03 ENCOUNTER — Encounter: Payer: Self-pay | Admitting: Nurse Practitioner

## 2024-02-08 ENCOUNTER — Ambulatory Visit: Admitting: Physician Assistant

## 2024-02-08 VITALS — BP 124/78 | HR 78 | Ht 63.0 in | Wt 205.0 lb

## 2024-02-08 DIAGNOSIS — Z87442 Personal history of urinary calculi: Secondary | ICD-10-CM

## 2024-02-08 NOTE — Patient Instructions (Addendum)
 [facility]ol[facility] Instructions [facility]   You will receive a box/kit in the mail that will have a urine jug and instructions in the kit.  When the box arrives you will need to call our office [phone] to schedule a LAB appointment.   You will need to do a 24hour urine and this should be done during the days that our office will be open.  For example any day from Sunday through Thursday.   If you take Vitamin C 100mg  or greater please stop this 5 days prior to collection.   How to collect the urine sample: On the day you start the urine sample this 1st morning urine should NOT be collected.  For the rest of the day including all night urines should be collected.  On the next morning the 1st urine should be collected and then you will be finished with the urine collections.   You will need to bring the box with you on your LAB appointment day after urine has been collected and all instructions are complete in the box.  Your blood will be drawn and the box will be collected by our Lab employee to be sent off for analysis.   When urine and blood is complete you will need to schedule a follow up appointment for lab results. Three changes you can make today for kidney stone prevention: 1. Increase fluid intake to 80-100oz daily. The best fluids for stone prevention are water and citric acid-containing beverages (e.g. lemonade, clear sodas, water with flavor additives like Crystal Light or True Lemon/Lime). Poor choices for stone prevention would include teas and sodas that contain phosphoric acid (these tend to be the dark-colored ones, e.g. Coke, Pepsi). 2. Decrease dietary oxalate intake. Oxalate is a compound found in varying concentrations in many foods. I provided you with a list of high oxalate foods today. Overall, I recommend using this list to help you make smart decisions about how to eat foods that are high in oxalate, either by limiting your portion sizes of those foods  and/or not eating multiple high-oxalate foods in the same meal. Additionally, there are multiple apps available for download on your phone geared toward kidney stone patients that contain libraries of oxalate content for common foods. I have several patients who've downloaded these and found them helpful as quick reference guides if you find yourself in the grocery store or a restaurant and want easy access to this information. 3. Maintain moderate dietary calcium  intake, 1000-1200mg  daily. Typically this can be achieved through dietary intake alone (e.g. one cup of milk contains 300mg  of calcium ), however some patients benefit from calcium  supplements if they are not hitting this goal. Calcium  helps to prevent stones by binding oxalate in your gut and preventing it from being absorbed into your bloodstream where it can ultimately be used to make stones. For this reason, one of my favorite tips for stone patients is to pair high-oxalate foods with high-calcium  foods in the same meal. I provided you with a list of calcium  content in foods today for your reference.

## 2024-02-08 NOTE — Progress Notes (Signed)
 02/08/2024 9:24 AM   Joan Johnson [birth year] [id number]  CC: Chief Complaint  Patient presents with   Follow-up   HPI: Joan Johnson is a 53 y.o. female with PMH nephrolithiasis who presents today for LithoLink results.   LithoLink report is notable for extremely low urine volume, 0.68L, high urine pH, 6.766, extremely high supersaturation of calcium  phosphate, 4.73, and high supersaturation of calcium  phosphate, 13.37.  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 She takes a daily calcium  supplement but does not recall the dose.  PMH: Past Medical History:  Diagnosis Date   Allergy    Anxiety    just when traveling   Arthritis    GERD (gastroesophageal reflux disease)    history   History of kidney stones    Hypertension    Left nephrolithiasis    Migraine    Osteopenia 06/2021   DEXA at [facility]; spine and hip   Osteopenia after menopause 09/2023   hip and spine; D[facility]A at [facility]; FRAX: mj=5.9%/hip=0.5%   Pneumonia    Premature menopause    in 30s   Undifferentiated connective tissue disease (HCC)     Surgical History: Past Surgical History:  Procedure Laterality Date   ANKLE SURGERY     AUGMENTATION MAMMAPLASTY     Breast Implant   CESAREAN SECTION     1991, 1989, 1987   COLONOSCOPY WITH PROPOFOL  N/A 10/01/2020   Procedure: COLONOSCOPY WITH PROPOFOL ;  Surgeon: Unk Corinn Skiff, MD;  Location: [facility];  Service: Gastroenterology;  Laterality: N/A;   COSMETIC SURGERY     CYSTOSCOPY W/ RETROGRADES Left 07/19/2021   Procedure: CYSTOSCOPY WITH RETROGRADE PYELOGRAM;  Surgeon: Penne Knee, MD;  Location: [facility];  Service: Urology;  Laterality: Left;   CYSTOSCOPY/URETEROSCOPY/HOLMIUM LASER/STENT PLACEMENT Left 07/19/2021   Procedure: CYSTOSCOPY/URETEROSCOPY/HOLMIUM LASER/STENT PLACEMENT;  Surgeon: Penne Knee, MD;  Location: [facility];  Service: Urology;  Laterality: Left;    CYSTOSCOPY/URETEROSCOPY/HOLMIUM LASER/STENT PLACEMENT Left 09/18/2023   Procedure: CYSTOSCOPY/URETEROSCOPY/HOLMIUM LASER/STENT PLACEMENT;  Surgeon: Penne Knee, MD;  Location: [facility];  Service: Urology;  Laterality: Left;   dental implants     ESOPHAGOGASTRODUODENOSCOPY (EGD) WITH PROPOFOL  N/A 10/01/2020   Procedure: ESOPHAGOGASTRODUODENOSCOPY (EGD) WITH PROPOFOL ;  Surgeon: Unk Corinn Skiff, MD;  Location: [facility];  Service: Gastroenterology;  Laterality: N/A;   HERNIA REPAIR     OTHER SURGICAL HISTORY     tummy tuck   OTHER SURGICAL HISTORY     carpal tunnel   TUBAL LIGATION      Home Medications:  Allergies as of 02/08/2024       Reactions   Codeine Itching, Other (See Comments)   Ivp Dye [iodinated Contrast Media] Hives   Shellfish Allergy Hives        Medication List        Accurate as of February 08, 2024  9:24 AM. If you have any questions, ask your nurse or doctor.          calcium  carbonate 1500 (600 Ca) MG Tabs tablet Commonly known as: OSCAL Take 600 mg of elemental calcium  by mouth daily.   diazepam  5 MG tablet Commonly known as: VALIUM  Take 1 tablet (5 mg total) by mouth once as needed for up to 1 dose for anxiety. Take at least 1 hour before flight.   EPINEPHrine  0.3 mg/0.3 mL Soaj injection Commonly known as: EPI-PEN Inject 0.3 mg into the muscle as needed for anaphylaxis.   HYDROcodone -acetaminophen  5-325 MG tablet  Commonly known as: NORCO/VICODIN Take 1-2 tablets by mouth every 6 (six) hours as needed for moderate pain (pain score 4-6).   hydroxychloroquine 200 MG tablet Commonly known as: PLAQUENIL Take 200 mg by mouth daily.   Imvexxy  Maintenance Pack 10 MCG Inst Generic drug: Estradiol  Place vaginally. Insert 1 supp vaginally 2 times a week   losartan  50 MG tablet Commonly known as: COZAAR  Take 1 tablet (50 mg total) by mouth daily.   magic mouthwash (nystatin , lidocaine , diphenhydrAMINE ) suspension Take 5 mLs by mouth 4  (four) times daily as needed for mouth pain.   Vitamin D  (Ergocalciferol ) 1.25 MG (50000 UNIT) Caps capsule Commonly known as: DRISDOL  Take 1 capsule (50,000 Units total) by mouth every 7 (seven) days.        Allergies:  Allergies  Allergen Reactions   Codeine Itching and Other (See Comments)   Ivp Dye [Iodinated Contrast Media] Hives   Shellfish Allergy Hives    Family History: Family History  Problem Relation Age of Onset   Heart murmur Mother    Thyroid disease Daughter    Breast cancer Neg Hx    Ovarian cancer Neg Hx     Social History:   reports that she quit smoking about 5 years ago. Her smoking use included cigarettes. She started smoking about 26 years ago. She has a 10.5 pack-year smoking history. She has been exposed to tobacco smoke. She has never used smokeless tobacco. She reports current alcohol use of about 2.0 standard drinks of alcohol per week. She reports that she does not use drugs.  Physical Exam: BP 124/78   Pulse 78   Ht 5' 3 (1.6 m)   Wt 205 lb (93 kg)   BMI 36.31 kg/m   Constitutional:  Alert and oriented, no acute distress, nontoxic appearing HEENT: [place], AT Cardiovascular: No clubbing, cyanosis, or edema Respiratory: Normal respiratory effort, no increased work of breathing Skin: No rashes, bruises or suspicious lesions Neurologic: Grossly intact, no focal deficits, moving all 4 extremities Psychiatric: Normal mood and affect  Laboratory Data: Results for orders placed or performed in visit on 12/22/23  Litholink Serum Panel   Collection Time: 12/22/23 10:43 AM  Result Value Ref Range   Uric Acid 4.1 3.0 - 7.2 mg/dL   Creatinine, Ser 9.34 0.57 - 1.00 mg/dL   eGFR 894 >40 fO/fpw/8.26   Sodium 141 134 - 144 mmol/L   Potassium 4.3 3.5 - 5.2 mmol/L   Chloride 103 96 - 106 mmol/L   CO2 21 20 - 29 mmol/L   Calcium  9.6 8.7 - 10.2 mg/dL   Phosphorus 4.1 3.0 - 4.3 mg/dL   Magnesium 1.8 1.6 - 2.3 mg/dL   Assessment & Plan:   1. History of  nephrolithiasis (Primary) Low urine volume, high urine pH, high supersaturations of calcium  phosphate and oxalate. I recommended starting with behavioral changes first.  I recommended 3 major changes today: increasing PO hydration to a goal of 80-100oz daily, reducing dietary oxalate, and maintaining moderate dietary calcium . I gave her printed copies of the oxalate and calcium  content resources. We discussed trying MyFitnessPal to estimate her baseline calcium  intake and then adjusting her supplement dose accordingly to reach her daily 1000-1200mg  dose.  Will plan to repeat LithoLink in 6 months to recheck progress and see her back next year for annual stone visit with KUB prior. - Litholink Serum Panel; Future - Litholink 24Hr Urine Panel; Future   Return in about 7 months (around 09/10/2024) for Annual stone visit  with KUB prior.  Lucie Hones, PA-C  [facility] [place] [address] [place], [place] [address] [phone]

## 2024-02-09 ENCOUNTER — Other Ambulatory Visit: Payer: Self-pay

## 2024-02-09 DIAGNOSIS — Z87442 Personal history of urinary calculi: Secondary | ICD-10-CM

## 2024-03-27 ENCOUNTER — Other Ambulatory Visit: Payer: Self-pay | Admitting: Nurse Practitioner

## 2024-03-27 ENCOUNTER — Ambulatory Visit (INDEPENDENT_AMBULATORY_CARE_PROVIDER_SITE_OTHER): Admitting: Nurse Practitioner

## 2024-03-27 ENCOUNTER — Encounter: Payer: Self-pay | Admitting: Nurse Practitioner

## 2024-03-27 VITALS — BP 139/89 | HR 95 | Temp 98.1°F | Resp 16 | Ht 63.0 in | Wt 215.0 lb

## 2024-03-27 DIAGNOSIS — E559 Vitamin D deficiency, unspecified: Secondary | ICD-10-CM

## 2024-03-27 DIAGNOSIS — E782 Mixed hyperlipidemia: Secondary | ICD-10-CM | POA: Diagnosis not present

## 2024-03-27 DIAGNOSIS — E538 Deficiency of other specified B group vitamins: Secondary | ICD-10-CM

## 2024-03-27 DIAGNOSIS — I10 Essential (primary) hypertension: Secondary | ICD-10-CM | POA: Diagnosis not present

## 2024-03-27 MED ORDER — LOSARTAN POTASSIUM 50 MG PO TABS: 50.0000 mg | ORAL_TABLET | Freq: Every day | ORAL | 2 refills | Status: AC

## 2024-03-27 NOTE — Progress Notes (Signed)
 [facility] [address] [place], [place] [address]  Internal MEDICINE  Office Visit Note  Patient Name: Joan Johnson  [id number]  [id number]  Date of Service: 03/27/2024  Chief Complaint  Patient presents with   Follow-up   Gastroesophageal Reflux   Hypertension   Medication Refill    Losartan     HPI Joan Johnson presents for a follow-up visit for high cholesterol, weight gain, low B12 and low vitamin D , lab orders and refills.  Vitamin D  -- taking weekly supplement B12 -- continue OTC supplement Weight gain -- has gained several more lbs. Unable to take phentermine and similar medications due to unwanted side effects. Her insurance will not cover the injectable GLP-1 medications  High cholesterol -- not currently on any statin therapy. Due for repeat labs.  The 10-year ASCVD risk score (Arnett DK, et al., 2019) is: 3.4%   Values used to calculate the score:     Age: 53 years     Clincally relevant sex: Female     Is Non-Hispanic African American: No     Diabetic: No     Tobacco smoker: No     Systolic Blood Pressure: 139 mmHg     Is BP treated: Yes     HDL Cholesterol: 51 mg/dL     Total Cholesterol: 219 mg/dL   Current Medication: Outpatient Encounter Medications as of 03/27/2024  Medication Sig Note   calcium  carbonate (OSCAL) 1500 (600 Ca) MG TABS tablet Take 600 mg of elemental calcium  by mouth daily.    diazepam  (VALIUM ) 5 MG tablet Take 1 tablet (5 mg total) by mouth once as needed for up to 1 dose for anxiety. Take at least 1 hour before flight.    EPINEPHrine  0.3 mg/0.3 mL IJ SOAJ injection Inject 0.3 mg into the muscle as needed for anaphylaxis. 09/20/2023: prn   Estradiol  (IMVEXXY  MAINTENANCE PACK) 10 MCG INST Place vaginally. Insert 1 supp vaginally 2 times a week    hydroxychloroquine (PLAQUENIL) 200 MG tablet Take 200 mg by mouth daily.    magic mouthwash (nystatin , lidocaine , diphenhydrAMINE ) suspension Take 5 mLs by mouth 4 (four) times daily as  needed for mouth pain.    Vitamin D , Ergocalciferol , (DRISDOL ) 1.25 MG (50000 UNIT) CAPS capsule Take 1 capsule (50,000 Units total) by mouth every 7 (seven) days.    [DISCONTINUED] HYDROcodone -acetaminophen  (NORCO/VICODIN) 5-325 MG tablet Take 1-2 tablets by mouth every 6 (six) hours as needed for moderate pain (pain score 4-6). 09/20/2023: As per Patient, takes every other day   [DISCONTINUED] losartan  (COZAAR ) 50 MG tablet Take 1 tablet (50 mg total) by mouth daily.    losartan  (COZAAR ) 50 MG tablet Take 1 tablet (50 mg total) by mouth daily.    No facility-administered encounter medications on file as of 03/27/2024.    Surgical History: Past Surgical History:  Procedure Laterality Date   ANKLE SURGERY     AUGMENTATION MAMMAPLASTY     Breast Implant   CESAREAN SECTION     1991, 1989, 1987   COLONOSCOPY WITH PROPOFOL  N/A 10/01/2020   Procedure: COLONOSCOPY WITH PROPOFOL ;  Surgeon: Unk Corinn Skiff, MD;  Location: [facility] ENDOSCOPY;  Service: Gastroenterology;  Laterality: N/A;   COSMETIC SURGERY     CYSTOSCOPY W/ RETROGRADES Left 07/19/2021   Procedure: CYSTOSCOPY WITH RETROGRADE PYELOGRAM;  Surgeon: Penne Knee, MD;  Location: [facility] ORS;  Service: Urology;  Laterality: Left;   CYSTOSCOPY/URETEROSCOPY/HOLMIUM LASER/STENT PLACEMENT Left 07/19/2021   Procedure: CYSTOSCOPY/URETEROSCOPY/HOLMIUM LASER/STENT PLACEMENT;  Surgeon: Penne Knee, MD;  Location: [facility] ORS;  Service: Urology;  Laterality: Left;   CYSTOSCOPY/URETEROSCOPY/HOLMIUM LASER/STENT PLACEMENT Left 09/18/2023   Procedure: CYSTOSCOPY/URETEROSCOPY/HOLMIUM LASER/STENT PLACEMENT;  Surgeon: Penne Knee, MD;  Location: [facility] ORS;  Service: Urology;  Laterality: Left;   dental implants     ESOPHAGOGASTRODUODENOSCOPY (EGD) WITH PROPOFOL  N/A 10/01/2020   Procedure: ESOPHAGOGASTRODUODENOSCOPY (EGD) WITH PROPOFOL ;  Surgeon: Unk Corinn Skiff, MD;  Location: [facility] ENDOSCOPY;  Service: Gastroenterology;  Laterality: N/A;   HERNIA  REPAIR     OTHER SURGICAL HISTORY     tummy tuck   OTHER SURGICAL HISTORY     carpal tunnel   TUBAL LIGATION      Medical History: Past Medical History:  Diagnosis Date   Allergy    Anxiety    just when traveling   Arthritis    GERD (gastroesophageal reflux disease)    history   History of kidney stones    Hypertension    Left nephrolithiasis    Migraine    Osteopenia 06/2021   DEXA at [facility]; spine and hip   Osteopenia after menopause 09/2023   hip and spine; DEXA at [facility]; FRAX: mj=5.9%/hip=0.5%   Pneumonia    Premature menopause    in 30s   Undifferentiated connective tissue disease (HCC)     Family History: Family History  Problem Relation Age of Onset   Heart murmur Mother    Thyroid disease Daughter    Breast cancer Neg Hx    Ovarian cancer Neg Hx     Social History   Socioeconomic History   Marital status: Married    Spouse name: Luis   Number of children: 2   Years of education: Not on file   Highest education level: 12th grade  Occupational History   Not on file  Tobacco Use   Smoking status: Former    Current packs/day: 0.00    Average packs/day: 0.5 packs/day for 21.0 years (10.5 ttl pk-yrs)    Types: Cigarettes    Start date: 03/29/1997    Quit date: 03/29/2018    Years since quitting: 6.0    Passive exposure: Past   Smokeless tobacco: Never  Vaping Use   Vaping status: Never Used  Substance and Sexual Activity   Alcohol use: Yes    Alcohol/week: 2.0 standard drinks of alcohol    Types: 2 Glasses of wine per week    Comment: weekly   Drug use: Never   Sexual activity: Yes    Birth control/protection: Post-menopausal  Other Topics Concern   Not on file  Social History Narrative   ** Merged History Encounter **       Social Drivers of Health   Financial Resource Strain: Low Risk  (10/19/2022)   Overall Financial Resource Strain (CARDIA)    Difficulty of Paying Living Expenses: Not hard at all  Food Insecurity: No Food Insecurity  (09/20/2023)   Hunger Vital Sign    Worried About Running Out of Food in the Last Year: Never true    Ran Out of Food in the Last Year: Never true  Transportation Needs: No Transportation Needs (09/20/2023)   PRAPARE - Administrator, Civil Service (Medical): No    Lack of Transportation (Non-Medical): No  Physical Activity: Not on file  Stress: No Stress Concern Present (10/19/2022)   [employer] of Occupational Health - Occupational Stress Questionnaire    Feeling of Stress : Not at all  Social Connections: Moderately Isolated (09/20/2023)   Social Connection and Isolation Panel  Frequency of Communication with Friends and Family: More than three times a week    Frequency of Social Gatherings with Friends and Family: More than three times a week    Attends Religious Services: Never    Database administrator or Organizations: No    Attends Banker Meetings: Never    Marital Status: Married  Catering manager Violence: Not At Risk (09/20/2023)   Humiliation, Afraid, Rape, and Kick questionnaire    Fear of Current or Ex-Partner: No    Emotionally Abused: No    Physically Abused: No    Sexually Abused: No      Review of Systems  Constitutional:  Positive for fatigue and unexpected weight change. Negative for chills.  HENT:  Negative for congestion, postnasal drip, rhinorrhea, sneezing and sore throat.   Eyes:  Negative for redness.  Respiratory: Negative.  Negative for cough, chest tightness, shortness of breath and wheezing.   Cardiovascular: Negative.  Negative for chest pain and palpitations.  Gastrointestinal:  Negative for abdominal pain, constipation, diarrhea, nausea and vomiting.  Genitourinary:  Negative for dysuria and frequency.  Musculoskeletal:  Positive for arthralgias and back pain. Negative for joint swelling and neck pain.  Skin:  Negative for rash.  Neurological: Negative.  Negative for tremors and numbness.  Hematological:  Negative  for adenopathy. Does not bruise/bleed easily.  Psychiatric/Behavioral:  Negative for behavioral problems (Depression), self-injury, sleep disturbance and suicidal ideas. The patient is not nervous/anxious.     Vital Signs: BP 139/89   Pulse 95   Temp 98.1 F (36.7 C)   Resp 16   Ht 5' 3 (1.6 m)   Wt 215 lb (97.5 kg)   SpO2 99%   BMI 38.09 kg/m    Physical Exam Vitals reviewed.  Constitutional:      General: She is not in acute distress.    Appearance: Normal appearance. She is obese. She is not ill-appearing.  HENT:     Head: Normocephalic and atraumatic.  Eyes:     Pupils: Pupils are equal, round, and reactive to light.  Cardiovascular:     Rate and Rhythm: Normal rate and regular rhythm.  Pulmonary:     Effort: Pulmonary effort is normal. No respiratory distress.  Skin:    General: Skin is warm and dry.     Capillary Refill: Capillary refill takes less than 2 seconds.  Neurological:     Mental Status: She is alert and oriented to person, place, and time.  Psychiatric:        Mood and Affect: Mood normal.        Behavior: Behavior normal.        Assessment/Plan: 1. Primary hypertension (Primary) Stable, continue losartan  as prescribed, routine labs ordered  - losartan  (COZAAR ) 50 MG tablet; Take 1 tablet (50 mg total) by mouth daily.  Dispense: 90 tablet; Refill: 2 - B12 and Folate Panel - Vitamin D  (25 hydroxy) - Lipid Profile - CMP14+EGFR - CBC with Differential/Platelet  2. Mixed hyperlipidemia Routine labs ordered  - B12 and Folate Panel - Vitamin D  (25 hydroxy) - Lipid Profile - CMP14+EGFR - CBC with Differential/Platelet  3. B12 deficiency Routine labs ordered  - B12 and Folate Panel - Vitamin D  (25 hydroxy) - Lipid Profile - CMP14+EGFR - CBC with Differential/Platelet  4. Vitamin D  deficiency Routine labs ordered  - B12 and Folate Panel - Vitamin D  (25 hydroxy) - Lipid Profile - CMP14+EGFR - CBC with  Differential/Platelet   General Counseling: Joan Johnson  verbalizes understanding of the findings of todays visit and agrees with plan of treatment. I have discussed any further diagnostic evaluation that may be needed or ordered today. We also reviewed her medications today. she has been encouraged to call the office with any questions or concerns that should arise related to todays visit.    Orders Placed This Encounter  Procedures   B12 and Folate Panel   Vitamin D  (25 hydroxy)   Lipid Profile   CMP14+EGFR   CBC with Differential/Platelet    Meds ordered this encounter  Medications   losartan  (COZAAR ) 50 MG tablet    Sig: Take 1 tablet (50 mg total) by mouth daily.    Dispense:  90 tablet    Refill:  2    ZERO refills remain on this prescription. Your patient is requesting advance approval of refills for this medication to PREVENT ANY MISSED DOSES    Return in about 6 weeks (around 05/07/2024) for F/U, Labs, Joan Johnson PCP.   Total time spent:30 Minutes Time spent includes review of chart, medications, test results, and follow up plan with the patient.   [place] Controlled Substance Database was reviewed by me.  This patient was seen by Joan Maxin, FNP-C in collaboration with Dr. Sigrid Bathe as a part of collaborative care agreement.   Joan Bera R. Maxin, MSN, FNP-C Internal medicine

## 2024-03-28 ENCOUNTER — Encounter: Payer: Self-pay | Admitting: Nurse Practitioner

## 2024-03-29 ENCOUNTER — Telehealth: Payer: Self-pay

## 2024-03-29 MED ORDER — TRAMADOL HCL 50 MG PO TABS
50.0000 mg | ORAL_TABLET | Freq: Four times a day (QID) | ORAL | 0 refills | Status: AC | PRN
Start: 2024-03-29 — End: 2024-04-03

## 2024-03-29 NOTE — Telephone Encounter (Signed)
 Patient notified

## 2024-05-02 ENCOUNTER — Other Ambulatory Visit: Payer: Self-pay | Admitting: Nurse Practitioner

## 2024-05-02 ENCOUNTER — Ambulatory Visit
Admission: EM | Admit: 2024-05-02 | Discharge: 2024-05-02 | Disposition: A | Discharge status: Home / Self Care | Attending: Internal Medicine | Admitting: Internal Medicine

## 2024-05-02 ENCOUNTER — Encounter: Payer: Self-pay | Admitting: Emergency Medicine

## 2024-05-02 DIAGNOSIS — M7918 Myalgia, other site: Secondary | ICD-10-CM | POA: Diagnosis not present

## 2024-05-02 DIAGNOSIS — E559 Vitamin D deficiency, unspecified: Secondary | ICD-10-CM

## 2024-05-02 DIAGNOSIS — S76011A Strain of muscle, fascia and tendon of right hip, initial encounter: Secondary | ICD-10-CM

## 2024-05-02 MED ORDER — BACLOFEN 10 MG PO TABS: 10.0000 mg | ORAL_TABLET | Freq: Three times a day (TID) | ORAL | 0 refills | Status: DC
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
## 2024-05-02 NOTE — Discharge Instructions (Signed)
 Your symptoms are likely due to a pulled muscle.  Pulled muscles typically heal on their own in several days.  You may take tylenol  as needed for aches and pains.  Take muscle relaxer as needed for muscle spasm, mostly take this at bedtime as this medicine can cause drowsiness.  Apply heat to the pulled muscle 20 minutes on 20 minutes off as needed, heat relaxes muscles.  Perform gentle exercises and stretches to area of tenderness.  I would like for you to rest, however I do not want you to avoid moving the area. Movement and stretching will help with healing.  If you develop any new or worsening symptoms or if your symptoms do not start to improve, please return here or follow-up with your primary care provider. If your symptoms are severe, please go to the emergency room.
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
## 2024-05-03 LAB — CBC WITH DIFFERENTIAL/PLATELET
Basophils Absolute: 0.1 x10E3/uL (ref 0.0–0.2)
Basos: 1 %
EOS (ABSOLUTE): 0.3 x10E3/uL (ref 0.0–0.4)
Eos: 5 %
Hematocrit: 42.5 % (ref 34.0–46.6)
Hemoglobin: 13.8 g/dL (ref 11.1–15.9)
Immature Grans (Abs): 0 x10E3/uL (ref 0.0–0.1)
Immature Granulocytes: 0 %
Lymphocytes Absolute: 1.3 x10E3/uL (ref 0.7–3.1)
Lymphs: 19 %
MCH: 30.9 pg (ref 26.6–33.0)
MCHC: 32.5 g/dL (ref 31.5–35.7)
MCV: 95 fL (ref 79–97)
Monocytes Absolute: 0.7 x10E3/uL (ref 0.1–0.9)
Monocytes: 10 %
Neutrophils Absolute: 4.5 x10E3/uL (ref 1.4–7.0)
Neutrophils: 65 %
Platelets: 241 x10E3/uL (ref 150–450)
RBC: 4.46 x10E6/uL (ref 3.77–5.28)
RDW: 12.2 % (ref 11.7–15.4)
WBC: 7 x10E3/uL (ref 3.4–10.8)

## 2024-05-03 LAB — LIPID PANEL
Chol/HDL Ratio: 4.6 ratio — ABNORMAL HIGH (ref 0.0–4.4)
Cholesterol, Total: 236 mg/dL — ABNORMAL HIGH (ref 100–199)
HDL: 51 mg/dL (ref >39)
LDL Chol Calc (NIH): 164 mg/dL — ABNORMAL HIGH (ref 0–99)
Triglycerides: 118 mg/dL (ref 0–149)
VLDL Cholesterol Cal: 21 mg/dL (ref 5–40)

## 2024-05-03 LAB — CMP14+EGFR
ALT: 20 IU/L (ref 0–32)
AST: 18 IU/L (ref 0–40)
Albumin: 4.4 g/dL (ref 3.8–4.9)
Alkaline Phosphatase: 72 IU/L (ref 49–135)
BUN/Creatinine Ratio: 15 (ref 9–23)
BUN: 10 mg/dL (ref 6–24)
Bilirubin Total: 1.2 mg/dL (ref 0.0–1.2)
CO2: 23 mmol/L (ref 20–29)
Calcium: 9.1 mg/dL (ref 8.7–10.2)
Chloride: 104 mmol/L (ref 96–106)
Creatinine, Ser: 0.68 mg/dL (ref 0.57–1.00)
Globulin, Total: 2.4 g/dL (ref 1.5–4.5)
Glucose: 75 mg/dL (ref 70–99)
Potassium: 4.1 mmol/L (ref 3.5–5.2)
Sodium: 142 mmol/L (ref 134–144)
Total Protein: 6.8 g/dL (ref 6.0–8.5)
eGFR: 103 mL/min/1.73 (ref >59)

## 2024-05-03 LAB — VITAMIN D 25 HYDROXY (VIT D DEFICIENCY, FRACTURES): Vit D, 25-Hydroxy: 39 ng/mL (ref 30.0–100.0)

## 2024-05-03 LAB — B12 AND FOLATE PANEL
Folate: 11.5 ng/mL (ref >3.0)
Vitamin B-12: 440 pg/mL (ref 232–1245)

## 2024-05-08 ENCOUNTER — Ambulatory Visit (INDEPENDENT_AMBULATORY_CARE_PROVIDER_SITE_OTHER): Admitting: Nurse Practitioner

## 2024-05-08 ENCOUNTER — Encounter: Payer: Self-pay | Admitting: Nurse Practitioner

## 2024-05-08 VITALS — BP 134/76 | HR 73 | Temp 97.2°F | Resp 16 | Ht 63.0 in | Wt 217.4 lb

## 2024-05-08 DIAGNOSIS — E782 Mixed hyperlipidemia: Secondary | ICD-10-CM

## 2024-05-08 DIAGNOSIS — M722 Plantar fascial fibromatosis: Secondary | ICD-10-CM | POA: Diagnosis not present

## 2024-05-08 DIAGNOSIS — I10 Essential (primary) hypertension: Secondary | ICD-10-CM

## 2024-05-08 MED ORDER — PHENTERMINE HCL 37.5 MG PO TABS: 37.5000 mg | ORAL_TABLET | Freq: Every day | ORAL | 1 refills | Status: DC

## 2024-05-08 NOTE — Progress Notes (Signed)
 [facility] [address] [place], [place] [address]  Internal MEDICINE  Office Visit Note  Patient Name: Joan Johnson  [id number]  [id number]  Date of Service: 05/08/2024  Chief Complaint  Patient presents with   Gastroesophageal Reflux   Hypertension   Follow-up    Review labs     HPI Joan Johnson presents for a follow-up visit for muscle strain, plantar fasciitis, obesity and lab results.  Muscle strain -- seen in urgent care, pain has improved with rest and baclofen .  Plantar fasciitis -- right worse than left. Pain in the heal that radiates to the arch of the foot. Hurts worse first thing in the morning when she gets up and stretching helps.  Obesity -- insurance will not cover the injectables. She did not tolerate diethylpropion . She tried phendimetrazine  before which did work some. She has not tried phentermine in a while.  High cholesterol -- working on weight loss to help improve cholesterol levels.  CBC, CMP, vitamin D  and B12 are normal. Folate is normal.     Current Medication: Outpatient Encounter Medications as of 05/08/2024  Medication Sig Note   phentermine (ADIPEX-P) 37.5 MG tablet Take 1 tablet (37.5 mg total) by mouth daily before breakfast. May start with 1/2 tablet daily for the first week.    baclofen  (LIORESAL ) 10 MG tablet Take 1 tablet (10 mg total) by mouth 3 (three) times daily. (Patient not taking: Reported on 05/08/2024)    calcium  carbonate (OSCAL) 1500 (600 Ca) MG TABS tablet Take 600 mg of elemental calcium  by mouth daily.    diazepam  (VALIUM ) 5 MG tablet Take 1 tablet (5 mg total) by mouth once as needed for up to 1 dose for anxiety. Take at least 1 hour before flight. (Patient not taking: Reported on 05/08/2024)    EPINEPHrine  0.3 mg/0.3 mL IJ SOAJ injection Inject 0.3 mg into the muscle as needed for anaphylaxis. 09/20/2023: prn   Estradiol  (IMVEXXY  MAINTENANCE PACK) 10 MCG INST Place vaginally. Insert 1 supp vaginally 2 times a week     hydroxychloroquine (PLAQUENIL) 200 MG tablet Take 200 mg by mouth daily.    losartan  (COZAAR ) 50 MG tablet Take 1 tablet (50 mg total) by mouth daily.    magic mouthwash (nystatin , lidocaine , diphenhydrAMINE ) suspension Take 5 mLs by mouth 4 (four) times daily as needed for mouth pain. (Patient not taking: Reported on 05/08/2024)    Vitamin D , Ergocalciferol , (DRISDOL ) 1.25 MG (50000 UNIT) CAPS capsule TAKE 1 CAPSULE BY MOUTH EVERY 7 DAYS    No facility-administered encounter medications on file as of 05/08/2024.    Surgical History: Past Surgical History:  Procedure Laterality Date   ANKLE SURGERY     AUGMENTATION MAMMAPLASTY     Breast Implant   CESAREAN SECTION     1991, 1989, 1987   COLONOSCOPY WITH PROPOFOL  N/A 10/01/2020   Procedure: COLONOSCOPY WITH PROPOFOL ;  Surgeon: Unk Corinn Skiff, MD;  Location: [facility] ENDOSCOPY;  Service: Gastroenterology;  Laterality: N/A;   COSMETIC SURGERY     CYSTOSCOPY W/ RETROGRADES Left 07/19/2021   Procedure: CYSTOSCOPY WITH RETROGRADE PYELOGRAM;  Surgeon: Penne Knee, MD;  Location: [facility] ORS;  Service: Urology;  Laterality: Left;   CYSTOSCOPY/URETEROSCOPY/HOLMIUM LASER/STENT PLACEMENT Left 07/19/2021   Procedure: CYSTOSCOPY/URETEROSCOPY/HOLMIUM LASER/STENT PLACEMENT;  Surgeon: Penne Knee, MD;  Location: [facility] ORS;  Service: Urology;  Laterality: Left;   CYSTOSCOPY/URETEROSCOPY/HOLMIUM LASER/STENT PLACEMENT Left 09/18/2023   Procedure: CYSTOSCOPY/URETEROSCOPY/HOLMIUM LASER/STENT PLACEMENT;  Surgeon: Penne Knee, MD;  Location: [facility] ORS;  Service: Urology;  Laterality:  Left;   dental implants     ESOPHAGOGASTRODUODENOSCOPY (EGD) WITH PROPOFOL  N/A 10/01/2020   Procedure: ESOPHAGOGASTRODUODENOSCOPY (EGD) WITH PROPOFOL ;  Surgeon: Unk Corinn Skiff, MD;  Location: [facility] ENDOSCOPY;  Service: Gastroenterology;  Laterality: N/A;   HERNIA REPAIR     OTHER SURGICAL HISTORY     tummy tuck   OTHER SURGICAL HISTORY     carpal tunnel   TUBAL  LIGATION      Medical History: Past Medical History:  Diagnosis Date   Allergy    Anxiety    just when traveling   Arthritis    GERD (gastroesophageal reflux disease)    history   History of kidney stones    Hypertension    Left nephrolithiasis    Migraine    Osteopenia 06/2021   DEXA at [facility]; spine and hip   Osteopenia after menopause 09/2023   hip and spine; DEXA at [facility]; FRAX: mj=5.9%/hip=0.5%   Pneumonia    Premature menopause    in 30s   Undifferentiated connective tissue disease     Family History: Family History  Problem Relation Age of Onset   Heart murmur Mother    Thyroid disease Daughter    Breast cancer Neg Hx    Ovarian cancer Neg Hx     Social History   Socioeconomic History   Marital status: Married    Spouse name: Luis   Number of children: 2   Years of education: Not on file   Highest education level: 12th grade  Occupational History   Not on file  Tobacco Use   Smoking status: Former    Current packs/day: 0.00    Average packs/day: 0.5 packs/day for 21.0 years (10.5 ttl pk-yrs)    Types: Cigarettes    Start date: 03/29/1997    Quit date: 03/29/2018    Years since quitting: 6.1    Passive exposure: Past   Smokeless tobacco: Never  Vaping Use   Vaping status: Never Used  Substance and Sexual Activity   Alcohol use: Yes    Alcohol/week: 2.0 standard drinks of alcohol    Types: 2 Glasses of wine per week    Comment: weekly   Drug use: Never   Sexual activity: Yes    Birth control/protection: Post-menopausal  Other Topics Concern   Not on file  Social History Narrative   ** Merged History Encounter **       Social Drivers of Health   Financial Resource Strain: Low Risk  (10/19/2022)   Overall Financial Resource Strain (CARDIA)    Difficulty of Paying Living Expenses: Not hard at all  Food Insecurity: No Food Insecurity (09/20/2023)   Hunger Vital Sign    Worried About Running Out of Food in the Last Year: Never true    Ran Out of  Food in the Last Year: Never true  Transportation Needs: No Transportation Needs (09/20/2023)   PRAPARE - Administrator, Civil Service (Medical): No    Lack of Transportation (Non-Medical): No  Physical Activity: Not on file  Stress: No Stress Concern Present (10/19/2022)   [employer] of Occupational Health - Occupational Stress Questionnaire    Feeling of Stress : Not at all  Social Connections: Moderately Isolated (09/20/2023)   Social Connection and Isolation Panel    Frequency of Communication with Friends and Family: More than three times a week    Frequency of Social Gatherings with Friends and Family: More than three times a week    Attends Religious  Services: Never    Active Member of Clubs or Organizations: No    Attends Banker Meetings: Never    Marital Status: Married  Catering manager Violence: Not At Risk (09/20/2023)   Humiliation, Afraid, Rape, and Kick questionnaire    Fear of Current or Ex-Partner: No    Emotionally Abused: No    Physically Abused: No    Sexually Abused: No      Review of Systems  Constitutional:  Positive for unexpected weight change. Negative for fatigue.  Respiratory: Negative.  Negative for cough, chest tightness, shortness of breath and wheezing.   Cardiovascular: Negative.  Negative for chest pain and palpitations.  Gastrointestinal: Negative.   Musculoskeletal:  Positive for arthralgias (bilateral foot pain) and myalgias.    Vital Signs: BP 134/76   Pulse 73   Temp (!) 97.2 F (36.2 C)   Resp 16   Ht 5' 3 (1.6 m)   Wt 217 lb 6.4 oz (98.6 kg)   SpO2 99%   BMI 38.51 kg/m    Physical Exam Vitals reviewed.  Constitutional:      General: She is not in acute distress.    Appearance: Normal appearance. She is obese. She is not ill-appearing.  HENT:     Head: Normocephalic and atraumatic.  Eyes:     Pupils: Pupils are equal, round, and reactive to light.  Cardiovascular:     Rate and Rhythm:  Normal rate and regular rhythm.  Pulmonary:     Effort: Pulmonary effort is normal. No respiratory distress.  Skin:    General: Skin is warm and dry.     Capillary Refill: Capillary refill takes less than 2 seconds.  Neurological:     Mental Status: She is alert and oriented to person, place, and time.  Psychiatric:        Mood and Affect: Mood normal.        Behavior: Behavior normal.        Assessment/Plan: 1. Primary hypertension (Primary) Stable, continue medications as prescribed.   2. Mixed hyperlipidemia Discuss lab results, working on weight loss to improve cholesterol levels for now.   3. Plantar fasciitis, bilateral Information and stretches and exercises given to patient. If no improvement, consider podiatry referral .   4. Morbid obesity (HCC) Will try phentermine for weight loss aid, follow up in 8 weeks  - phentermine (ADIPEX-P) 37.5 MG tablet; Take 1 tablet (37.5 mg total) by mouth daily before breakfast. May start with 1/2 tablet daily for the first week.  Dispense: 30 tablet; Refill: 1   General Counseling: Lequita verbalizes understanding of the findings of todays visit and agrees with plan of treatment. I have discussed any further diagnostic evaluation that may be needed or ordered today. We also reviewed her medications today. she has been encouraged to call the office with any questions or concerns that should arise related to todays visit.    No orders of the defined types were placed in this encounter.   Meds ordered this encounter  Medications   phentermine (ADIPEX-P) 37.5 MG tablet    Sig: Take 1 tablet (37.5 mg total) by mouth daily before breakfast. May start with 1/2 tablet daily for the first week.    Dispense:  30 tablet    Refill:  1    Fill new script today, do not run through insurance, patient will have a goodrx coupon.    Return in about 8 weeks (around 07/03/2024) for F/U, eval new med, Jejuan Scala PCP.  Total time spent:30  Minutes Time spent includes review of chart, medications, test results, and follow up plan with the patient.   [place] Controlled Substance Database was reviewed by me.  This patient was seen by Mardy Maxin, FNP-C in collaboration with Dr. Sigrid Bathe as a part of collaborative care agreement.   Glenda Kunst R. Maxin, MSN, FNP-C Internal medicine

## 2024-05-13 ENCOUNTER — Ambulatory Visit: Payer: Self-pay

## 2024-05-13 ENCOUNTER — Ambulatory Visit
Admission: EM | Admit: 2024-05-13 | Discharge: 2024-05-13 | Disposition: A | Discharge status: Home / Self Care | Attending: Family Medicine | Admitting: Family Medicine

## 2024-05-13 DIAGNOSIS — M79632 Pain in left forearm: Secondary | ICD-10-CM

## 2024-05-13 NOTE — ED Provider Notes (Signed)
 [facility]    CSN: [id number] Arrival date & time: 05/13/24  1727      History   Chief Complaint Chief Complaint  Patient presents with   Arm Pain    HPI Joan Johnson is a 53 y.o. female presents for forearm pain.  Patient states she was driving today noticed that her mid left forearm look like there was a bump in it.  She ran her hand over and states it was tender.  She reports if she presses on it or leans on it it hurts otherwise she denies any pain.  Denies any known injury or inciting event.  No redness, warmth, ecchymosis.  No numbness or tingling.  She states she has anxiety and wanted to make sure everything was okay.  No other concerns at this time   Arm Pain    Past Medical History:  Diagnosis Date   Allergy    Anxiety    just when traveling   Arthritis    GERD (gastroesophageal reflux disease)    history   History of kidney stones    Hypertension    Left nephrolithiasis    Migraine    Osteopenia 06/2021   DEXA at [facility]; spine and hip   Osteopenia after menopause 09/2023   hip and spine; DEXA at [facility]; FRAX: mj=5.9%/hip=0.5%   Pneumonia    Premature menopause    in 30s   Undifferentiated connective tissue disease     Patient Active Problem List   Diagnosis Date Noted   Plantar fasciitis, bilateral 05/08/2024   S/P lithotripsy cystoscopy with ureteral stent placement 09/20/2023   Complicated UTI (urinary tract infection) 09/20/2023   Atopic dermatitis 10/05/2022   Constipation 10/05/2022   Chronic bilateral low back pain without sciatica 06/21/2022   Anxiety 04/06/2022   Dependent edema 01/11/2022   Premature menopause 06/17/2021   Osteopenia 06/17/2021   TMJ (temporomandibular joint syndrome) 06/03/2021   Mixed hyperlipidemia 06/03/2021   Memory changes 06/03/2021   Hair thinning 09/01/2020   Hypertension 09/01/2020   Hx of migraines 07/29/2020   Gastroesophageal reflux disease 07/29/2020   Vitamin D  deficiency 07/29/2020   Lung  nodule 07/29/2020   Rheumatoid arthritis with positive rheumatoid factor (HCC) 10/17/2017   BMI 36.0-36.9,adult 09/19/2017    Past Surgical History:  Procedure Laterality Date   ANKLE SURGERY     AUGMENTATION MAMMAPLASTY     Breast Implant   CESAREAN SECTION     1991, 1989, 1987   COLONOSCOPY WITH PROPOFOL  N/A 10/01/2020   Procedure: COLONOSCOPY WITH PROPOFOL ;  Surgeon: Unk Corinn Skiff, MD;  Location: [facility] ENDOSCOPY;  Service: Gastroenterology;  Laterality: N/A;   COSMETIC SURGERY     CYSTOSCOPY W/ RETROGRADES Left 07/19/2021   Procedure: CYSTOSCOPY WITH RETROGRADE PYELOGRAM;  Surgeon: Penne Knee, MD;  Location: [facility] ORS;  Service: Urology;  Laterality: Left;   CYSTOSCOPY/URETEROSCOPY/HOLMIUM LASER/STENT PLACEMENT Left 07/19/2021   Procedure: CYSTOSCOPY/URETEROSCOPY/HOLMIUM LASER/STENT PLACEMENT;  Surgeon: Penne Knee, MD;  Location: [facility] ORS;  Service: Urology;  Laterality: Left;   CYSTOSCOPY/URETEROSCOPY/HOLMIUM LASER/STENT PLACEMENT Left 09/18/2023   Procedure: CYSTOSCOPY/URETEROSCOPY/HOLMIUM LASER/STENT PLACEMENT;  Surgeon: Penne Knee, MD;  Location: [facility] ORS;  Service: Urology;  Laterality: Left;   dental implants     ESOPHAGOGASTRODUODENOSCOPY (EGD) WITH PROPOFOL  N/A 10/01/2020   Procedure: ESOPHAGOGASTRODUODENOSCOPY (EGD) WITH PROPOFOL ;  Surgeon: Unk Corinn Skiff, MD;  Location: [facility] ENDOSCOPY;  Service: Gastroenterology;  Laterality: N/A;   HERNIA REPAIR     OTHER SURGICAL HISTORY     tummy tuck   OTHER  SURGICAL HISTORY     carpal tunnel   TUBAL LIGATION      OB History     Gravida  3   Para  3   Term  3   Preterm  0   AB  0   Living         SAB  0   IAB  0   Ectopic  0   Multiple      Live Births               Home Medications    Prior to Admission medications   Medication Sig Start Date End Date Taking? Authorizing Provider  baclofen  (LIORESAL ) 10 MG tablet Take 1 tablet (10 mg total) by mouth 3 (three) times  daily. Patient not taking: No sig reported 05/02/24   Enedelia Dorna HERO, FNP  calcium  carbonate (OSCAL) 1500 (600 Ca) MG TABS tablet Take 600 mg of elemental calcium  by mouth daily.    [provider]  diazepam  (VALIUM ) 5 MG tablet Take 1 tablet (5 mg total) by mouth once as needed for up to 1 dose for anxiety. Take at least 1 hour before flight. Patient not taking: No sig reported 11/14/23   Liana Fish, NP  EPINEPHrine  0.3 mg/0.3 mL IJ SOAJ injection Inject 0.3 mg into the muscle as needed for anaphylaxis. 04/06/22   Cyndi Shaver, PA-C  Estradiol  (IMVEXXY  MAINTENANCE PACK) 10 MCG INST Place vaginally. Insert 1 supp vaginally 2 times a week    [provider]  hydroxychloroquine (PLAQUENIL) 200 MG tablet Take 200 mg by mouth daily. 02/19/21   [provider]  losartan  (COZAAR ) 50 MG tablet Take 1 tablet (50 mg total) by mouth daily. 03/27/24   Liana Fish, NP  magic mouthwash (nystatin , lidocaine , diphenhydrAMINE ) suspension Take 5 mLs by mouth 4 (four) times daily as needed for mouth pain. Patient not taking: No sig reported 11/14/23   Abernathy, Alyssa, NP  phentermine (ADIPEX-P) 37.5 MG tablet Take 1 tablet (37.5 mg total) by mouth daily before breakfast. May start with 1/2 tablet daily for the first week. 05/08/24   Liana Fish, NP  Vitamin D , Ergocalciferol , (DRISDOL ) 1.25 MG (50000 UNIT) CAPS capsule TAKE 1 CAPSULE BY MOUTH EVERY 7 DAYS 05/03/24   Liana Fish, NP    Family History Family History  Problem Relation Age of Onset   Heart murmur Mother    Thyroid disease Daughter    Breast cancer Neg Hx    Ovarian cancer Neg Hx     Social History Social History   Tobacco Use   Smoking status: Former    Current packs/day: 0.00    Average packs/day: 0.5 packs/day for 21.0 years (10.5 ttl pk-yrs)    Types: Cigarettes    Start date: 03/29/1997    Quit date: 03/29/2018    Years since quitting: 6.1    Passive exposure: Past   Smokeless  tobacco: Never  Vaping Use   Vaping status: Never Used  Substance Use Topics   Alcohol use: Yes    Alcohol/week: 2.0 standard drinks of alcohol    Types: 2 Glasses of wine per week    Comment: weekly   Drug use: Never     Allergies   Codeine, Ivp dye [iodinated contrast media], and Shellfish allergy   Review of Systems Review of Systems  Musculoskeletal:        Left forearm     Physical Exam Triage Vital Signs ED Triage Vitals  Encounter Vitals Group  BP 05/13/24 1808 (!) 142/87     Girls Systolic BP Percentile --      Girls Diastolic BP Percentile --      Boys Systolic BP Percentile --      Boys Diastolic BP Percentile --      Pulse Rate 05/13/24 1808 92     Resp 05/13/24 1808 18     Temp 05/13/24 1808 98.3 F (36.8 C)     Temp Source 05/13/24 1808 Oral     SpO2 05/13/24 1808 98 %     Weight --      Height --      Head Circumference --      Peak Flow --      Pain Score 05/13/24 1807 3     Pain Loc --      Pain Education --      Exclude from Growth Chart --    No data found.  Updated Vital Signs BP (!) 142/87 (BP Location: Right Arm)   Pulse 92   Temp 98.3 F (36.8 C) (Oral)   Resp 18   SpO2 98%   Visual Acuity Right Eye Distance:   Left Eye Distance:   Bilateral Distance:    Right Eye Near:   Left Eye Near:    Bilateral Near:     Physical Exam Vitals and nursing note reviewed.  Constitutional:      General: She is not in acute distress.    Appearance: Normal appearance. She is not ill-appearing.  HENT:     Head: Normocephalic and atraumatic.  Eyes:     Pupils: Pupils are equal, round, and reactive to light.  Cardiovascular:     Rate and Rhythm: Normal rate.  Pulmonary:     Effort: Pulmonary effort is normal.  Musculoskeletal:       Arms:     Comments: There is a very slight soft tissue swelling of the mid left forearm without erythema, warmth, ecchymosis.  Area is tender to palpation.  There is no induration or fluctuance.  Skin is  intact without rashes or abrasions.  Skin:    General: Skin is warm and dry.  Neurological:     General: No focal deficit present.     Mental Status: She is alert and oriented to person, place, and time.  Psychiatric:        Mood and Affect: Mood normal.        Behavior: Behavior normal.      UC Treatments / Results  Labs (all labs ordered are listed, but only abnormal results are displayed) Labs Reviewed - No data to display  EKG   Radiology No results found.  Procedures Procedures (including critical care time)  Medications Ordered in UC Medications - No data to display  Initial Impression / Assessment and Plan / UC Course  I have reviewed the triage vital signs and the nursing notes.  Pertinent labs & imaging results that were available during my care of the patient were reviewed by me and considered in my medical decision making (see chart for details).     Reviewed exam and symptoms with patient.  No red flags.  Discussed strain versus possible soft tissue injury advised to monitor/RICE therapy and OTC analgesics as needed.  PCP follow-up in 2 to 3 days for recheck.  ER precautions reviewed Final Clinical Impressions(s) / UC Diagnoses   Final diagnoses:  Left forearm pain     Discharge Instructions      Do cool  compresses to the area and take Tylenol .  Monitor closely if you notice any change in size and redevelop any redness, warmth, fevers, shortness of breath or chest pain please seek reevaluation.  Follow-up with your PCP in 2 to 3 days for recheck.    ED Prescriptions   None    PDMP not reviewed this encounter.   Loreda Myla SAUNDERS, NP 05/13/24 [phone]

## 2024-05-13 NOTE — Telephone Encounter (Signed)
 FYI Only or Action Required?: FYI only for provider.  Patient was last seen in primary care on 10/20/2022 by Ostwalt, Janna, PA-C.  Called Nurse Triage reporting Pain.  Symptoms began today.  Interventions attempted: Nothing.  Symptoms are: stable.  Triage Disposition: See PCP Within 2 Weeks  Patient/caregiver understands and will follow disposition?: No    Does not appear pt is established here, RN advised UC for evaluation. Pt stated understanding. She states her PCP is [employer], but appears she works for [employer]. RN recommended UC.    Copied from CRM [id number]. Topic: Clinical - Red Word Triage >> May 13, 2024  3:56 PM Amber H wrote: [facility] that prompted transfer to Nurse Triage: Patient called and stated she was having pain in her forearm on left side and it was bumping up Reason for Disposition  [1] MILD pain (e.g., does not interfere with normal activities) AND [2] present > 7 days    No over 7 days  Answer Assessment - Initial Assessment Questions 1. ONSET: When did the pain start?     Just now 2. LOCATION: Where is the pain located?     Forearm by the elbow, quarter, feels like something is in there 3. PAIN: How bad is the pain? (Scale 0-10; or none, mild, moderate, severe)     2-3 4. WORK OR EXERCISE: Has there been any recent work or exercise that involved this part of the body?     no 5. CAUSE: What do you think is causing the arm pain?     unknown 6. OTHER SYMPTOMS: Do you have any other symptoms? (e.g., neck pain, swelling, rash, fever, numbness, weakness)     swelling  Protocols used: Arm Pain-A-AH

## 2024-05-13 NOTE — ED Triage Notes (Signed)
 Pt reports she was driving today and noticed she has a bump in the left forearm and was hot. States she has some discomfort. Denies any injury.

## 2024-05-13 NOTE — Discharge Instructions (Addendum)
 Do cool compresses to the area and take Tylenol .  Monitor closely if you notice any change in size and redevelop any redness, warmth, fevers, shortness of breath or chest pain please seek reevaluation.  Follow-up with your PCP in 2 to 3 days for recheck.

## 2024-05-13 NOTE — ED Notes (Signed)
Pt not answered phone.

## 2024-05-13 NOTE — ED Notes (Signed)
Pt no answered in lobby.

## 2024-06-09 ENCOUNTER — Ambulatory Visit
Admission: EM | Admit: 2024-06-09 | Discharge: 2024-06-09 | Disposition: A | Discharge status: Home / Self Care | Attending: Emergency Medicine | Admitting: Emergency Medicine

## 2024-06-09 ENCOUNTER — Encounter: Payer: Self-pay | Admitting: Emergency Medicine

## 2024-06-09 DIAGNOSIS — J069 Acute upper respiratory infection, unspecified: Secondary | ICD-10-CM | POA: Diagnosis not present

## 2024-06-09 DIAGNOSIS — J029 Acute pharyngitis, unspecified: Secondary | ICD-10-CM | POA: Diagnosis not present

## 2024-06-09 LAB — POCT RAPID STREP A (OFFICE): Rapid Strep A Screen: NEGATIVE

## 2024-06-09 NOTE — ED Provider Notes (Signed)
 [facility]AL[facility]EL[facility]    CSN: [id number] Arrival date & time: 06/09/24  9175      History   Chief Complaint Chief Complaint  Patient presents with   Sore Throat    HPI Joan Johnson is a 53 y.o. female.   Patient presents for evaluation of a sore throat beginning 1 day ago, has begun to experience a mild productive cough with green sputum today.  No known sick contacts.  Tolerable to food and liquids.  Has attempted use of  Hall's cough drops.  Denies congestion, ear pain, fever, shortness of breath or wheezing.    Past Medical History:  Diagnosis Date   Allergy    Anxiety    just when traveling   Arthritis    GERD (gastroesophageal reflux disease)    history   History of kidney stones    Hypertension    Left nephrolithiasis    Migraine    Osteopenia 06/2021   DEXA at [facility]; spine and hip   Osteopenia after menopause 09/2023   hip and spine; DEXA at [facility]; FRAX: mj=5.9%/hip=0.5%   Pneumonia    Premature menopause    in 30s   Undifferentiated connective tissue disease     Patient Active Problem List   Diagnosis Date Noted   Plantar fasciitis, bilateral 05/08/2024   S/P lithotripsy cystoscopy with ureteral stent placement 09/20/2023   Complicated UTI (urinary tract infection) 09/20/2023   Atopic dermatitis 10/05/2022   Constipation 10/05/2022   Chronic bilateral low back pain without sciatica 06/21/2022   Anxiety 04/06/2022   Dependent edema 01/11/2022   Premature menopause 06/17/2021   Osteopenia 06/17/2021   TMJ (temporomandibular joint syndrome) 06/03/2021   Mixed hyperlipidemia 06/03/2021   Memory changes 06/03/2021   Hair thinning 09/01/2020   Hypertension 09/01/2020   Hx of migraines 07/29/2020   Gastroesophageal reflux disease 07/29/2020   Vitamin D  deficiency 07/29/2020   Lung nodule 07/29/2020   Rheumatoid arthritis with positive rheumatoid factor (HCC) 10/17/2017   BMI 36.0-36.9,adult 09/19/2017    Past Surgical History:  Procedure  Laterality Date   ANKLE SURGERY     AUGMENTATION MAMMAPLASTY     Breast Implant   CESAREAN SECTION     1991, 1989, 1987   COLONOSCOPY WITH PROPOFOL  N/A 10/01/2020   Procedure: COLONOSCOPY WITH PROPOFOL ;  Surgeon: Unk Corinn Skiff, MD;  Location: [facility] ENDOSCOPY;  Service: Gastroenterology;  Laterality: N/A;   COSMETIC SURGERY     CYSTOSCOPY W/ RETROGRADES Left 07/19/2021   Procedure: CYSTOSCOPY WITH RETROGRADE PYELOGRAM;  Surgeon: Penne Knee, MD;  Location: [facility] ORS;  Service: Urology;  Laterality: Left;   CYSTOSCOPY/URETEROSCOPY/HOLMIUM LASER/STENT PLACEMENT Left 07/19/2021   Procedure: CYSTOSCOPY/URETEROSCOPY/HOLMIUM LASER/STENT PLACEMENT;  Surgeon: Penne Knee, MD;  Location: [facility] ORS;  Service: Urology;  Laterality: Left;   CYSTOSCOPY/URETEROSCOPY/HOLMIUM LASER/STENT PLACEMENT Left 09/18/2023   Procedure: CYSTOSCOPY/URETEROSCOPY/HOLMIUM LASER/STENT PLACEMENT;  Surgeon: Penne Knee, MD;  Location: [facility] ORS;  Service: Urology;  Laterality: Left;   dental implants     ESOPHAGOGASTRODUODENOSCOPY (EGD) WITH PROPOFOL  N/A 10/01/2020   Procedure: ESOPHAGOGASTRODUODENOSCOPY (EGD) WITH PROPOFOL ;  Surgeon: Unk Corinn Skiff, MD;  Location: [facility] ENDOSCOPY;  Service: Gastroenterology;  Laterality: N/A;   HERNIA REPAIR     OTHER SURGICAL HISTORY     tummy tuck   OTHER SURGICAL HISTORY     carpal tunnel   TUBAL LIGATION      OB History     Gravida  3   Para  3   Term  3   Preterm  0  AB  0   Living         SAB  0   IAB  0   Ectopic  0   Multiple      Live Births               Home Medications    Prior to Admission medications   Medication Sig Start Date End Date Taking? Authorizing Provider  baclofen  (LIORESAL ) 10 MG tablet Take 1 tablet (10 mg total) by mouth 3 (three) times daily. Patient not taking: No sig reported 05/02/24   Enedelia Dorna HERO, FNP  calcium  carbonate (OSCAL) 1500 (600 Ca) MG TABS tablet Take 600 mg of elemental calcium  by  mouth daily.    [provider]  diazepam  (VALIUM ) 5 MG tablet Take 1 tablet (5 mg total) by mouth once as needed for up to 1 dose for anxiety. Take at least 1 hour before flight. Patient not taking: No sig reported 11/14/23   Liana Fish, NP  EPINEPHrine  0.3 mg/0.3 mL IJ SOAJ injection Inject 0.3 mg into the muscle as needed for anaphylaxis. 04/06/22   Cyndi Shaver, PA-C  Estradiol  (IMVEXXY  MAINTENANCE PACK) 10 MCG INST Place vaginally. Insert 1 supp vaginally 2 times a week    [provider]  hydroxychloroquine (PLAQUENIL) 200 MG tablet Take 200 mg by mouth daily. 02/19/21   [provider]  losartan  (COZAAR ) 50 MG tablet Take 1 tablet (50 mg total) by mouth daily. 03/27/24   Liana Fish, NP  magic mouthwash (nystatin , lidocaine , diphenhydrAMINE ) suspension Take 5 mLs by mouth 4 (four) times daily as needed for mouth pain. Patient not taking: No sig reported 11/14/23   Abernathy, Alyssa, NP  phentermine (ADIPEX-P) 37.5 MG tablet Take 1 tablet (37.5 mg total) by mouth daily before breakfast. May start with 1/2 tablet daily for the first week. 05/08/24   Liana Fish, NP  Vitamin D , Ergocalciferol , (DRISDOL ) 1.25 MG (50000 UNIT) CAPS capsule TAKE 1 CAPSULE BY MOUTH EVERY 7 DAYS 05/03/24   Liana Fish, NP    Family History Family History  Problem Relation Age of Onset   Heart murmur Mother    Thyroid disease Daughter    Breast cancer Neg Hx    Ovarian cancer Neg Hx     Social History Social History   Tobacco Use   Smoking status: Former    Current packs/day: 0.00    Average packs/day: 0.5 packs/day for 21.0 years (10.5 ttl pk-yrs)    Types: Cigarettes    Start date: 03/29/1997    Quit date: 03/29/2018    Years since quitting: 6.2    Passive exposure: Past   Smokeless tobacco: Never  Vaping Use   Vaping status: Never Used  Substance Use Topics   Alcohol use: Yes    Alcohol/week: 2.0 standard drinks of alcohol    Types: 2 Glasses of  wine per week    Comment: weekly   Drug use: Never     Allergies   Codeine, Ivp dye [iodinated contrast media], and Shellfish allergy   Review of Systems Review of Systems   Physical Exam Triage Vital Signs ED Triage Vitals  Encounter Vitals Group     BP 06/09/24 0837 116/73     Girls Systolic BP Percentile --      Girls Diastolic BP Percentile --      Boys Systolic BP Percentile --      Boys Diastolic BP Percentile --      Pulse Rate 06/09/24 0837  93     Resp 06/09/24 0837 18     Temp 06/09/24 0837 98.3 F (36.8 C)     Temp Source 06/09/24 0837 Oral     SpO2 06/09/24 0837 98 %     Weight --      Height --      Head Circumference --      Peak Flow --      Pain Score 06/09/24 0839 5     Pain Loc --      Pain Education --      Exclude from Growth Chart --    No data found.  Updated Vital Signs BP 116/73 (BP Location: Left Arm)   Pulse 93   Temp 98.3 F (36.8 C) (Oral)   Resp 18   SpO2 98%   Visual Acuity Right Eye Distance:   Left Eye Distance:   Bilateral Distance:    Right Eye Near:   Left Eye Near:    Bilateral Near:     Physical Exam Constitutional:      Appearance: Normal appearance.  HENT:     Right Ear: Tympanic membrane, ear canal and external ear normal.     Left Ear: Tympanic membrane, ear canal and external ear normal.     Nose: No congestion.     Mouth/Throat:     Pharynx: No oropharyngeal exudate or posterior oropharyngeal erythema.  Eyes:     Extraocular Movements: Extraocular movements intact.  Cardiovascular:     Rate and Rhythm: Normal rate and regular rhythm.     Pulses: Normal pulses.     Heart sounds: Normal heart sounds.  Pulmonary:     Effort: Pulmonary effort is normal.     Breath sounds: Normal breath sounds.  Musculoskeletal:     Cervical back: Normal range of motion and neck supple.  Skin:    General: Skin is warm and dry.  Neurological:     Mental Status: She is alert and oriented to person, place, and time.  Mental status is at baseline.      UC Treatments / Results  Labs (all labs ordered are listed, but only abnormal results are displayed) Labs Reviewed  POCT RAPID STREP A (OFFICE)    EKG   Radiology No results found.  Procedures Procedures (including critical care time)  Medications Ordered in UC Medications - No data to display  Initial Impression / Assessment and Plan / UC Course  I have reviewed the triage vital signs and the nursing notes.  Pertinent labs & imaging results that were available during my care of the patient were reviewed by me and considered in my medical decision making (see chart for details).  Viral URI with cough, sore throat  Patient is in no signs of distress nor toxic appearing.  Vital signs are stable.  Low suspicion for pneumonia, pneumothorax or bronchitis and therefore will defer imaging.  Rapid strep test negative.May use additional over-the-counter medications as needed for supportive care.  May follow-up with urgent care as needed if symptoms persist or worsen.   Final Clinical Impressions(s) / UC Diagnoses   Final diagnoses:  Sore throat   Discharge Instructions   None    ED Prescriptions   None    PDMP not reviewed this encounter.   Teresa Shelba SAUNDERS, [place] 06/09/24 [phone]

## 2024-06-09 NOTE — ED Triage Notes (Signed)
 Patient reports sore throat that started yesterday. Patient has not taking anything for symptoms. Rates pain 5/10.

## 2024-06-09 NOTE — Discharge Instructions (Signed)
Your symptoms today are most likely being caused by a virus and should steadily improve in time it can take up to 7 to 10 days before you truly start to see a turnaround however things will get better  Rapid strep test negative    You can take Tylenol and/or Ibuprofen as needed for fever reduction and pain relief.   For cough: honey 1/2 to 1 teaspoon (you can dilute the honey in water or another fluid).  You can also use guaifenesin and dextromethorphan for cough. You can use a humidifier for chest congestion and cough.  If you don't have a humidifier, you can sit in the bathroom with the hot shower running.      For sore throat: try warm salt water gargles, cepacol lozenges, throat spray, warm tea or water with lemon/honey, popsicles or ice, or OTC cold relief medicine for throat discomfort.   For congestion: take a daily anti-histamine like Zyrtec, Claritin, and a oral decongestant, such as pseudoephedrine.  You can also use Flonase 1-2 sprays in each nostril daily.   It is important to stay hydrated: drink plenty of fluids (water, gatorade/powerade/pedialyte, juices, or teas) to keep your throat moisturized and help further relieve irritation/discomfort.

## 2024-07-03 ENCOUNTER — Ambulatory Visit (INDEPENDENT_AMBULATORY_CARE_PROVIDER_SITE_OTHER): Admitting: Nurse Practitioner

## 2024-07-03 ENCOUNTER — Encounter: Payer: Self-pay | Admitting: Nurse Practitioner

## 2024-07-03 VITALS — BP 130/86 | HR 100 | Temp 96.3°F | Resp 16 | Ht 63.0 in | Wt 209.8 lb

## 2024-07-03 DIAGNOSIS — K644 Residual hemorrhoidal skin tags: Secondary | ICD-10-CM | POA: Insufficient documentation

## 2024-07-03 DIAGNOSIS — F419 Anxiety disorder, unspecified: Secondary | ICD-10-CM | POA: Diagnosis not present

## 2024-07-03 DIAGNOSIS — R42 Dizziness and giddiness: Secondary | ICD-10-CM

## 2024-07-03 MED ORDER — HYDROXYZINE HCL 10 MG PO TABS: 10.0000 mg | ORAL_TABLET | Freq: Three times a day (TID) | ORAL | 1 refills | Status: AC | PRN

## 2024-07-03 MED ORDER — DIAZEPAM 5 MG PO TABS: 5.0000 mg | ORAL_TABLET | Freq: Once | ORAL | 0 refills | Status: AC | PRN

## 2024-07-03 MED ORDER — PHENTERMINE HCL 37.5 MG PO TABS: 37.5000 mg | ORAL_TABLET | Freq: Every day | ORAL | 1 refills | Status: DC

## 2024-07-03 NOTE — Progress Notes (Signed)
 [facility] [address] [place], [place] [address]  Internal MEDICINE  Office Visit Note  Patient Name: Joan Johnson  [id number]  [id number]  Date of Service: 07/03/2024  Chief Complaint  Patient presents with   Gastroesophageal Reflux   Hypertension   Follow-up    HPI Joan Johnson presents for a follow-up visit for hemorrhoids, weight loss, anxiety and vertigo.  Multiple external hemorrhoids with bleeding. Has had grade 1 internal hemorroids with banding done in march last year. Wants to have these hemorrhoids removed.  Vertigo -- still having issues off and on, wants to have low dose hydroxyzine  on hand just in case she has any more episodes.  She is taking a flight in a couple of weeks for the holidays and is requesting the valium  to take for the flight to and from again due to increased anxiety with flying.  Weight loss -- taking phentermine , has lost 8 lbs since her last office visit. She does report mild blurred vision which is a possible side effect. She wears readers when she needs to which helps this issue.    Current Medication: Outpatient Encounter Medications as of 07/03/2024  Medication Sig Note   calcium  carbonate (OSCAL) 1500 (600 Ca) MG TABS tablet Take 600 mg of elemental calcium  by mouth daily.    diazepam  (VALIUM ) 5 MG tablet Take 1 tablet (5 mg total) by mouth once as needed for up to 1 dose for anxiety. Take at least 1 hour before flight.    EPINEPHrine  0.3 mg/0.3 mL IJ SOAJ injection Inject 0.3 mg into the muscle as needed for anaphylaxis. 09/20/2023: prn   hydroxychloroquine (PLAQUENIL) 200 MG tablet Take 200 mg by mouth daily.    hydrOXYzine  (ATARAX ) 10 MG tablet Take 1-2 tablets (10-20 mg total) by mouth 3 (three) times daily as needed (dizziness).    losartan  (COZAAR ) 50 MG tablet Take 1 tablet (50 mg total) by mouth daily.    phentermine  (ADIPEX-P ) 37.5 MG tablet Take 1 tablet (37.5 mg total) by mouth daily before breakfast. May start with 1/2  tablet daily for the first week.    Vitamin D , Ergocalciferol , (DRISDOL ) 1.25 MG (50000 UNIT) CAPS capsule TAKE 1 CAPSULE BY MOUTH EVERY 7 DAYS    [DISCONTINUED] baclofen  (LIORESAL ) 10 MG tablet Take 1 tablet (10 mg total) by mouth 3 (three) times daily. (Patient not taking: Reported on 07/03/2024)    [DISCONTINUED] diazepam  (VALIUM ) 5 MG tablet Take 1 tablet (5 mg total) by mouth once as needed for up to 1 dose for anxiety. Take at least 1 hour before flight. (Patient not taking: No sig reported)    [DISCONTINUED] Estradiol  (IMVEXXY  MAINTENANCE PACK) 10 MCG INST Place vaginally. Insert 1 supp vaginally 2 times a week (Patient not taking: Reported on 07/03/2024) 06/09/2024: Patient not taking   [DISCONTINUED] magic mouthwash (nystatin , lidocaine , diphenhydrAMINE ) suspension Take 5 mLs by mouth 4 (four) times daily as needed for mouth pain. (Patient not taking: Reported on 07/03/2024)    [DISCONTINUED] phentermine  (ADIPEX-P ) 37.5 MG tablet Take 1 tablet (37.5 mg total) by mouth daily before breakfast. May start with 1/2 tablet daily for the first week. 06/09/2024: Patient not taking   No facility-administered encounter medications on file as of 07/03/2024.    Surgical History: Past Surgical History:  Procedure Laterality Date   ANKLE SURGERY     AUGMENTATION MAMMAPLASTY     Breast Implant   CESAREAN SECTION     1991, 1989, 1987   COLONOSCOPY WITH PROPOFOL  N/A 10/01/2020  Procedure: COLONOSCOPY WITH PROPOFOL ;  Surgeon: Unk Corinn Skiff, MD;  Location: [facility] ENDOSCOPY;  Service: Gastroenterology;  Laterality: N/A;   COSMETIC SURGERY     CYSTOSCOPY W/ RETROGRADES Left 07/19/2021   Procedure: CYSTOSCOPY WITH RETROGRADE PYELOGRAM;  Surgeon: Penne Knee, MD;  Location: [facility] ORS;  Service: Urology;  Laterality: Left;   CYSTOSCOPY/URETEROSCOPY/HOLMIUM LASER/STENT PLACEMENT Left 07/19/2021   Procedure: CYSTOSCOPY/URETEROSCOPY/HOLMIUM LASER/STENT PLACEMENT;  Surgeon: Penne Knee, MD;  Location:  [facility] ORS;  Service: Urology;  Laterality: Left;   CYSTOSCOPY/URETEROSCOPY/HOLMIUM LASER/STENT PLACEMENT Left 09/18/2023   Procedure: CYSTOSCOPY/URETEROSCOPY/HOLMIUM LASER/STENT PLACEMENT;  Surgeon: Penne Knee, MD;  Location: [facility] ORS;  Service: Urology;  Laterality: Left;   dental implants     ESOPHAGOGASTRODUODENOSCOPY (EGD) WITH PROPOFOL  N/A 10/01/2020   Procedure: ESOPHAGOGASTRODUODENOSCOPY (EGD) WITH PROPOFOL ;  Surgeon: Unk Corinn Skiff, MD;  Location: [facility] ENDOSCOPY;  Service: Gastroenterology;  Laterality: N/A;   HERNIA REPAIR     OTHER SURGICAL HISTORY     tummy tuck   OTHER SURGICAL HISTORY     carpal tunnel   TUBAL LIGATION      Medical History: Past Medical History:  Diagnosis Date   Allergy    Anxiety    just when traveling   Arthritis    GERD (gastroesophageal reflux disease)    history   History of kidney stones    Hypertension    Left nephrolithiasis    Migraine    Osteopenia 06/2021   DEXA at [facility]; spine and hip   Osteopenia after menopause 09/2023   hip and spine; DEXA at [facility]; FRAX: mj=5.9%/hip=0.5%   Pneumonia    Premature menopause    in 30s   Undifferentiated connective tissue disease     Family History: Family History  Problem Relation Age of Onset   Heart murmur Mother    Thyroid disease Daughter    Breast cancer Neg Hx    Ovarian cancer Neg Hx     Social History   Socioeconomic History   Marital status: Married    Spouse name: Joan Johnson   Number of children: 2   Years of education: Not on file   Highest education level: 12th grade  Occupational History   Not on file  Tobacco Use   Smoking status: Former    Current packs/day: 0.00    Average packs/day: 0.5 packs/day for 21.0 years (10.5 ttl pk-yrs)    Types: Cigarettes    Start date: 03/29/1997    Quit date: 03/29/2018    Years since quitting: 6.2    Passive exposure: Past   Smokeless tobacco: Never  Vaping Use   Vaping status: Never Used  Substance and Sexual Activity    Alcohol use: Yes    Alcohol/week: 2.0 standard drinks of alcohol    Types: 2 Glasses of wine per week    Comment: weekly   Drug use: Never   Sexual activity: Yes    Birth control/protection: Post-menopausal  Other Topics Concern   Not on file  Social History Narrative   ** Merged History Encounter **       Social Drivers of Health   Financial Resource Strain: Low Risk  (10/19/2022)   Overall Financial Resource Strain (CARDIA)    Difficulty of Paying Living Expenses: Not hard at all  Food Insecurity: No Food Insecurity (09/20/2023)   Hunger Vital Sign    Worried About Running Out of Food in the Last Year: Never true    Ran Out of Food in the Last Year: Never true  Transportation Needs:  No Transportation Needs (09/20/2023)   PRAPARE - Administrator, Civil Service (Medical): No    Lack of Transportation (Non-Medical): No  Physical Activity: Not on file  Stress: No Stress Concern Present (10/19/2022)   [employer] of Occupational Health - Occupational Stress Questionnaire    Feeling of Stress : Not at all  Social Connections: Moderately Isolated (09/20/2023)   Social Connection and Isolation Panel    Frequency of Communication with Friends and Family: More than three times a week    Frequency of Social Gatherings with Friends and Family: More than three times a week    Attends Religious Services: Never    Database Administrator or Organizations: No    Attends Banker Meetings: Never    Marital Status: Married  Catering Manager Violence: Not At Risk (09/20/2023)   Humiliation, Afraid, Rape, and Kick questionnaire    Fear of Current or Ex-Partner: No    Emotionally Abused: No    Physically Abused: No    Sexually Abused: No      Review of Systems  Constitutional:  Positive for unexpected weight change. Negative for fatigue.  HENT: Negative.    Respiratory: Negative.  Negative for cough, chest tightness, shortness of breath and wheezing.    Cardiovascular: Negative.  Negative for chest pain and palpitations.  Gastrointestinal:  Positive for anal bleeding (due to external hemorrhoids) and rectal pain (related to external hemorrhoids). Negative for abdominal distention, abdominal pain, nausea and vomiting.  Musculoskeletal:  Positive for arthralgias (bilateral foot pain). Negative for back pain and myalgias.  Neurological:  Positive for dizziness.  Psychiatric/Behavioral:  Negative for self-injury and suicidal ideas. The patient is nervous/anxious.     Vital Signs: BP 130/86   Pulse 100   Temp (!) 96.3 F (35.7 C)   Resp 16   Ht 5' 3 (1.6 m)   Wt 209 lb 12.8 oz (95.2 kg)   SpO2 97%   BMI 37.16 kg/m    Physical Exam Vitals reviewed.  Constitutional:      General: She is not in acute distress.    Appearance: Normal appearance. She is obese. She is not ill-appearing.  HENT:     Head: Normocephalic and atraumatic.  Eyes:     Pupils: Pupils are equal, round, and reactive to light.  Cardiovascular:     Rate and Rhythm: Normal rate and regular rhythm.     Heart sounds: Normal heart sounds. No murmur heard. Pulmonary:     Effort: Pulmonary effort is normal. No respiratory distress.     Breath sounds: Normal breath sounds. No wheezing.  Genitourinary:    Rectum: External hemorrhoid present.  Neurological:     Mental Status: She is alert and oriented to person, place, and time.  Psychiatric:        Mood and Affect: Mood normal.        Behavior: Behavior normal.        Assessment/Plan: 1. Bleeding external hemorrhoids (Primary) Referred to GI for treatment of external hemorrhoids - Ambulatory referral to Gastroenterology  2. Vertigo Hydroxyzine  prescribed, take as needed  - hydrOXYzine  (ATARAX ) 10 MG tablet; Take 1-2 tablets (10-20 mg total) by mouth 3 (three) times daily as needed (dizziness).  Dispense: 90 tablet; Refill: 1  3. Anxiety with flying Diazepam  prescribed for 1 tablet for the flight to and  from her destination for anxiety related to flying.  - diazepam  (VALIUM ) 5 MG tablet; Take 1 tablet (5 mg total) by  mouth once as needed for up to 1 dose for anxiety. Take at least 1 hour before flight.  Dispense: 2 tablet; Refill: 0  4. Morbid obesity (HCC) Continue phentermine  as prescribed, follow up in 8 weeks for weigh in  - phentermine  (ADIPEX-P ) 37.5 MG tablet; Take 1 tablet (37.5 mg total) by mouth daily before breakfast. May start with 1/2 tablet daily for the first week.  Dispense: 30 tablet; Refill: 1   General Counseling: Lyndee verbalizes understanding of the findings of todays visit and agrees with plan of treatment. I have discussed any further diagnostic evaluation that may be needed or ordered today. We also reviewed her medications today. she has been encouraged to call the office with any questions or concerns that should arise related to todays visit.    Orders Placed This Encounter  Procedures   Ambulatory referral to Gastroenterology    Meds ordered this encounter  Medications   phentermine  (ADIPEX-P ) 37.5 MG tablet    Sig: Take 1 tablet (37.5 mg total) by mouth daily before breakfast. May start with 1/2 tablet daily for the first week.    Dispense:  30 tablet    Refill:  1    Fill new script today, do not run through insurance, patient will have a goodrx coupon.   diazepam  (VALIUM ) 5 MG tablet    Sig: Take 1 tablet (5 mg total) by mouth once as needed for up to 1 dose for anxiety. Take at least 1 hour before flight.    Dispense:  2 tablet    Refill:  0    Fill new script today   hydrOXYzine  (ATARAX ) 10 MG tablet    Sig: Take 1-2 tablets (10-20 mg total) by mouth 3 (three) times daily as needed (dizziness).    Dispense:  90 tablet    Refill:  1    Fill new script today    Return in about 8 weeks (around 08/28/2024) for F/U, Falynn Ailey PCP.   Total time spent:30 Minutes Time spent includes review of chart, medications, test results, and follow up plan with the  patient.   [place] Controlled Substance Database was reviewed by me.  This patient was seen by Mardy Maxin, FNP-C in collaboration with Dr. Sigrid Bathe as a part of collaborative care agreement.   Abdirahim Flavell R. Maxin, MSN, FNP-C Internal medicine

## 2024-07-04 ENCOUNTER — Telehealth: Payer: Self-pay | Admitting: Nurse Practitioner

## 2024-07-04 NOTE — Telephone Encounter (Signed)
 Gastroenterology referral sent via [facility] to [facility] notifying patient. Gave patient telephone # [phone]

## 2024-07-09 ENCOUNTER — Telehealth: Payer: Self-pay | Admitting: Nurse Practitioner

## 2024-07-09 NOTE — Telephone Encounter (Signed)
 Gastroenterology appointment 11/21/2024 with [facility]

## 2024-08-06 ENCOUNTER — Other Ambulatory Visit: Payer: Self-pay | Admitting: Nurse Practitioner

## 2024-08-06 DIAGNOSIS — E559 Vitamin D deficiency, unspecified: Secondary | ICD-10-CM

## 2024-08-09 ENCOUNTER — Ambulatory Visit: Admitting: Physician Assistant

## 2024-08-19 ENCOUNTER — Other Ambulatory Visit

## 2024-08-19 DIAGNOSIS — Z87442 Personal history of urinary calculi: Secondary | ICD-10-CM

## 2024-08-20 LAB — LITHOLINK SERUM PANEL
CO2: 24 mmol/L (ref 20–29)
Calcium: 9.7 mg/dL (ref 8.7–10.2)
Chloride: 103 mmol/L (ref 96–106)
Creatinine, Ser: 0.66 mg/dL (ref 0.57–1.00)
Magnesium: 2 mg/dL (ref 1.6–2.3)
Phosphorus: 4 mg/dL (ref 3.0–4.3)
Potassium: 4.7 mmol/L (ref 3.5–5.2)
Sodium: 141 mmol/L (ref 134–144)
Uric Acid: 3.9 mg/dL (ref 3.0–7.2)
eGFR: 104 mL/min/1.73

## 2024-08-23 LAB — LITHOLINK 24HR URINE PANEL

## 2024-08-24 ENCOUNTER — Ambulatory Visit: Payer: Self-pay | Admitting: Urology

## 2024-08-28 ENCOUNTER — Ambulatory Visit: Admitting: Nurse Practitioner

## 2024-08-28 ENCOUNTER — Encounter: Payer: Self-pay | Admitting: Nurse Practitioner

## 2024-08-28 VITALS — BP 144/98 | HR 81 | Temp 97.3°F | Resp 16 | Ht 63.0 in | Wt 213.0 lb

## 2024-08-28 DIAGNOSIS — J0110 Acute frontal sinusitis, unspecified: Secondary | ICD-10-CM | POA: Diagnosis not present

## 2024-08-28 DIAGNOSIS — T3695XA Adverse effect of unspecified systemic antibiotic, initial encounter: Secondary | ICD-10-CM | POA: Diagnosis not present

## 2024-08-28 DIAGNOSIS — B379 Candidiasis, unspecified: Secondary | ICD-10-CM

## 2024-08-28 MED ORDER — WEGOVY 1.5 MG PO TABS: 1.5000 mg | ORAL_TABLET | Freq: Every day | ORAL | 0 refills | Status: AC

## 2024-08-28 MED ORDER — WEGOVY 4 MG PO TABS: 4.0000 mg | ORAL_TABLET | Freq: Every day | ORAL | 2 refills | Status: AC

## 2024-08-28 MED ORDER — FLUCONAZOLE 150 MG PO TABS: 150.0000 mg | ORAL_TABLET | Freq: Once | ORAL | 0 refills | Status: AC

## 2024-08-28 MED ORDER — AMOXICILLIN-POT CLAVULANATE 875-125 MG PO TABS: 1.0000 | ORAL_TABLET | Freq: Two times a day (BID) | ORAL | 0 refills | Status: AC

## 2024-08-28 NOTE — Progress Notes (Signed)
 [facility] [address] [place], [place] [address]  Internal MEDICINE  Office Visit Note  Patient Name: Joan Johnson  [id number]  [id number]  Date of Service: 08/28/2024  Chief Complaint  Patient presents with   Gastroesophageal Reflux   Hypertension   Follow-up    HPI Erian presents for a follow-up visit for  weight loss -- gained 4-5 lbs since last visit, phentermine  is not helping.  sinusitis   Current Medication: Outpatient Encounter Medications as of 08/28/2024  Medication Sig Note   amoxicillin -clavulanate (AUGMENTIN ) 875-125 MG tablet Take 1 tablet by mouth 2 (two) times daily for 10 days. May take with food    fluconazole  (DIFLUCAN ) 150 MG tablet Take 1 tablet (150 mg total) by mouth once for 1 dose. May take an additional dose after 3 days if still symptomatic.    semaglutide -weight management (WEGOVY ) 1.5 MG tablet Take 1 tablet (1.5 mg total) by mouth daily. Daily in AM on an empty stomach with 4 oz of water. Do not eat or drink for 30 minutes after dose.    [START ON 09/18/2024] semaglutide -weight management (WEGOVY ) 4 MG tablet Take 1 tablet (4 mg total) by mouth daily. Daily in morning on an empty stomach with 4 oz of water. Do not eat or drink for 30 minutes after dose.    calcium  carbonate (OSCAL) 1500 (600 Ca) MG TABS tablet Take 600 mg of elemental calcium  by mouth daily.    diazepam  (VALIUM ) 5 MG tablet Take 1 tablet (5 mg total) by mouth once as needed for up to 1 dose for anxiety. Take at least 1 hour before flight.    EPINEPHrine  0.3 mg/0.3 mL IJ SOAJ injection Inject 0.3 mg into the muscle as needed for anaphylaxis. 09/20/2023: prn   hydroxychloroquine (PLAQUENIL) 200 MG tablet Take 200 mg by mouth daily.    hydrOXYzine  (ATARAX ) 10 MG tablet Take 1-2 tablets (10-20 mg total) by mouth 3 (three) times daily as needed (dizziness).    losartan  (COZAAR ) 50 MG tablet Take 1 tablet (50 mg total) by mouth daily.    Vitamin D , Ergocalciferol ,  (DRISDOL ) 1.25 MG (50000 UNIT) CAPS capsule TAKE 1 CAPSULE BY MOUTH 1 TIME A WEEK    [DISCONTINUED] phentermine  (ADIPEX-P ) 37.5 MG tablet Take 1 tablet (37.5 mg total) by mouth daily before breakfast. May start with 1/2 tablet daily for the first week.    No facility-administered encounter medications on file as of 08/28/2024.    Surgical History: Past Surgical History:  Procedure Laterality Date   ANKLE SURGERY     AUGMENTATION MAMMAPLASTY     Breast Implant   CESAREAN SECTION     1991, 1989, 1987   COLONOSCOPY WITH PROPOFOL  N/A 10/01/2020   Procedure: COLONOSCOPY WITH PROPOFOL ;  Surgeon: Unk Corinn Skiff, MD;  Location: [facility] ENDOSCOPY;  Service: Gastroenterology;  Laterality: N/A;   COSMETIC SURGERY     CYSTOSCOPY W/ RETROGRADES Left 07/19/2021   Procedure: CYSTOSCOPY WITH RETROGRADE PYELOGRAM;  Surgeon: Penne Knee, MD;  Location: [facility] ORS;  Service: Urology;  Laterality: Left;   CYSTOSCOPY/URETEROSCOPY/HOLMIUM LASER/STENT PLACEMENT Left 07/19/2021   Procedure: CYSTOSCOPY/URETEROSCOPY/HOLMIUM LASER/STENT PLACEMENT;  Surgeon: Penne Knee, MD;  Location: [facility] ORS;  Service: Urology;  Laterality: Left;   CYSTOSCOPY/URETEROSCOPY/HOLMIUM LASER/STENT PLACEMENT Left 09/18/2023   Procedure: CYSTOSCOPY/URETEROSCOPY/HOLMIUM LASER/STENT PLACEMENT;  Surgeon: Penne Knee, MD;  Location: [facility] ORS;  Service: Urology;  Laterality: Left;   dental implants     ESOPHAGOGASTRODUODENOSCOPY (EGD) WITH PROPOFOL  N/A 10/01/2020   Procedure: ESOPHAGOGASTRODUODENOSCOPY (EGD) WITH PROPOFOL ;  Surgeon: Unk Corinn Skiff, MD;  Location: [facility] ENDOSCOPY;  Service: Gastroenterology;  Laterality: N/A;   HERNIA REPAIR     OTHER SURGICAL HISTORY     tummy tuck   OTHER SURGICAL HISTORY     carpal tunnel   TUBAL LIGATION      Medical History: Past Medical History:  Diagnosis Date   Allergy    Anxiety    just when traveling   Arthritis    GERD (gastroesophageal reflux disease)    history    History of kidney stones    Hypertension    Left nephrolithiasis    Migraine    Osteopenia 06/2021   DEXA at [facility]; spine and hip   Osteopenia after menopause 09/2023   hip and spine; DEXA at [facility]; FRAX: mj=5.9%/hip=0.5%   Pneumonia    Premature menopause    in 30s   Undifferentiated connective tissue disease     Family History: Family History  Problem Relation Age of Onset   Heart murmur Mother    Thyroid disease Daughter    Breast cancer Neg Hx    Ovarian cancer Neg Hx     Social History   Socioeconomic History   Marital status: Married    Spouse name: Luis   Number of children: 2   Years of education: Not on file   Highest education level: 12th grade  Occupational History   Not on file  Tobacco Use   Smoking status: Former    Current packs/day: 0.00    Average packs/day: 0.5 packs/day for 21.0 years (10.5 ttl pk-yrs)    Types: Cigarettes    Start date: 03/29/1997    Quit date: 03/29/2018    Years since quitting: 6.4    Passive exposure: Past   Smokeless tobacco: Never  Vaping Use   Vaping status: Never Used  Substance and Sexual Activity   Alcohol use: Yes    Alcohol/week: 2.0 standard drinks of alcohol    Types: 2 Glasses of wine per week    Comment: weekly   Drug use: Never   Sexual activity: Yes    Birth control/protection: Post-menopausal  Other Topics Concern   Not on file  Social History Narrative   ** Merged History Encounter **       Social Drivers of Health   Tobacco Use: Medium Risk (08/28/2024)   Patient History    Smoking Tobacco Use: Former    Smokeless Tobacco Use: Never    Passive Exposure: Past  Physicist, Medical Strain: Low Risk  (07/31/2024)   Received from [facility]   Overall Financial Resource Strain (CARDIA)    Difficulty of Paying Living Expenses: Not hard at all  Food Insecurity: No Food Insecurity (07/31/2024)   Received from [facility]   Epic    Within the past 12 months, you  worried that your food would run out before you got the money to buy more.: Never true    Within the past 12 months, the food you bought just didn't last and you didn't have money to get more.: Never true  Transportation Needs: No Transportation Needs (07/31/2024)   Received from [facility] - Transportation    In the past 12 months, has lack of transportation kept you from medical appointments or from getting medications?: No    Lack of Transportation (Non-Medical): No  Physical Activity: Not on file  Stress: No Stress Concern Present (10/19/2022)   [employer] of [employer]ational  [employer] - Occupational Stress Questionnaire    Feeling of Stress : Not at all  Social Connections: Moderately Isolated (09/20/2023)   Social Connection and Isolation Panel    Frequency of Communication with Friends and Family: More than three times a week    Frequency of Social Gatherings with Friends and Family: More than three times a week    Attends Religious Services: Never    Database Administrator or Organizations: No    Attends Banker Meetings: Never    Marital Status: Married  Catering Manager Violence: Not At Risk (09/20/2023)   Humiliation, Afraid, Rape, and Kick questionnaire    Fear of Current or Ex-Partner: No    Emotionally Abused: No    Physically Abused: No    Sexually Abused: No  Depression (PHQ2-9): Low Risk (03/27/2024)   Depression (PHQ2-9)    PHQ-2 Score: 0  Alcohol Screen: Low Risk (03/27/2024)   Alcohol Screen    Last Alcohol Screening Score (AUDIT): 2  Housing: Low Risk  (07/31/2024)   Received from [facility]   Epic    In the last 12 months, was there a time when you were not able to pay the mortgage or rent on time?: No    In the past 12 months, how many times have you moved where you were living?: 0    At any time in the past 12 months, were you homeless or living in a shelter (including now)?: No  Utilities: Not At  Risk (07/31/2024)   Received from [facility]   Epic    In the past 12 months has the electric, gas, oil, or water company threatened to shut off services in your home?: No  Health Literacy: Not on file      Review of Systems  Vital Signs: BP (!) 144/98   Pulse 81   Temp (!) 97.3 F (36.3 C)   Resp 16   Ht 5' 3 (1.6 m)   Wt 213 lb (96.6 kg)   SpO2 96%   BMI 37.73 kg/m    Physical Exam     Assessment/Plan:   General Counseling: Tammela verbalizes understanding of the findings of todays visit and agrees with plan of treatment. I have discussed any further diagnostic evaluation that may be needed or ordered today. We also reviewed her medications today. she has been encouraged to call the office with any questions or concerns that should arise related to todays visit.    No orders of the defined types were placed in this encounter.   Meds ordered this encounter  Medications   semaglutide -weight management (WEGOVY ) 1.5 MG tablet    Sig: Take 1 tablet (1.5 mg total) by mouth daily. Daily in AM on an empty stomach with 4 oz of water. Do not eat or drink for 30 minutes after dose.    Dispense:  30 tablet    Refill:  0    Fill new script today. Insurance does not cover weight loss medications. Will increase to 4 mg dose after 30 days   semaglutide -weight management (WEGOVY ) 4 MG tablet    Sig: Take 1 tablet (4 mg total) by mouth daily. Daily in morning on an empty stomach with 4 oz of water. Do not eat or drink for 30 minutes after dose.    Dispense:  30 tablet    Refill:  2    Patient to start this dose after 1 month of 1.5 mg dose.  amoxicillin -clavulanate (AUGMENTIN ) 875-125 MG tablet    Sig: Take 1 tablet by mouth 2 (two) times daily for 10 days. May take with food    Dispense:  20 tablet    Refill:  0    Fill new script today   fluconazole  (DIFLUCAN ) 150 MG tablet    Sig: Take 1 tablet (150 mg total) by mouth once for 1 dose. May take an  additional dose after 3 days if still symptomatic.    Dispense:  3 tablet    Refill:  0    Fill new script today.    Return in about 8 weeks (around 10/23/2024) for F/U, eval new med, Jayston Trevino PCP.   Total time spent:*** Minutes Time spent includes review of chart, medications, test results, and follow up plan with the patient.   [place] Controlled Substance Database was reviewed by me.  This patient was seen by Mardy Maxin, FNP-C in collaboration with Dr. Sigrid Bathe as a part of collaborative care agreement.   Derrious Bologna R. Maxin, MSN, FNP-C Internal medicine

## 2024-08-29 ENCOUNTER — Encounter: Payer: Self-pay | Admitting: Nurse Practitioner

## 2024-08-30 ENCOUNTER — Telehealth: Payer: Self-pay

## 2024-08-30 ENCOUNTER — Ambulatory Visit (INDEPENDENT_AMBULATORY_CARE_PROVIDER_SITE_OTHER): Admitting: Physician Assistant

## 2024-08-30 VITALS — BP 123/83 | HR 104 | Ht 63.0 in

## 2024-08-30 DIAGNOSIS — N200 Calculus of kidney: Secondary | ICD-10-CM

## 2024-08-30 DIAGNOSIS — Z87442 Personal history of urinary calculi: Secondary | ICD-10-CM

## 2024-08-30 NOTE — Progress Notes (Signed)
 Patient presented to clinic today for repeat Litholink results.  The urine test was canceled due to prolonged period of collection.  Unfortunately, she was counseled incorrectly by our staff on appropriate collection techniques.  I apologized for our miscommunication.  This is a no charge visit.  I will see her back in 6 months with KUB and repeat Litholink prior.

## 2024-08-30 NOTE — Patient Instructions (Signed)

## 2024-08-30 NOTE — Telephone Encounter (Signed)
 Error

## 2024-09-04 ENCOUNTER — Other Ambulatory Visit: Payer: Self-pay | Admitting: Nurse Practitioner

## 2024-09-04 ENCOUNTER — Telehealth: Payer: Self-pay

## 2024-09-04 MED ORDER — PREDNISONE 10 MG (21) PO TBPK: ORAL_TABLET | ORAL | 0 refills | Status: AC

## 2024-09-04 MED ORDER — DOXYCYCLINE HYCLATE 100 MG PO TABS: 100.0000 mg | ORAL_TABLET | Freq: Two times a day (BID) | ORAL | 0 refills | Status: AC

## 2024-09-04 NOTE — Telephone Encounter (Signed)
 She want doxycyline and prednisone  send in

## 2024-09-05 NOTE — Telephone Encounter (Signed)
 Patient notified

## 2024-09-10 ENCOUNTER — Ambulatory Visit: Admitting: Physician Assistant

## 2024-09-11 ENCOUNTER — Ambulatory Visit: Admitting: Physician Assistant

## 2024-10-23 ENCOUNTER — Ambulatory Visit: Admitting: Nurse Practitioner

## 2025-02-14 ENCOUNTER — Ambulatory Visit: Admitting: Physician Assistant
